# Patient Record
Sex: Male | Born: 1935 | Race: White | Hispanic: No | Marital: Married | Smoking: Former smoker
Health system: Southern US, Community
[De-identification: ages and names within clinical notes are randomized; demographics above are authoritative.]

## PROBLEM LIST (undated history)

## (undated) ENCOUNTER — Emergency Department (HOSPITAL_COMMUNITY): Admission: EM | Discharge status: Against Medical Advice | Payer: Medicare Other

## (undated) DIAGNOSIS — E7800 Pure hypercholesterolemia, unspecified: Secondary | ICD-10-CM

## (undated) DIAGNOSIS — J45909 Unspecified asthma, uncomplicated: Secondary | ICD-10-CM

## (undated) DIAGNOSIS — J189 Pneumonia, unspecified organism: Secondary | ICD-10-CM

## (undated) DIAGNOSIS — C61 Malignant neoplasm of prostate: Secondary | ICD-10-CM

## (undated) DIAGNOSIS — K219 Gastro-esophageal reflux disease without esophagitis: Secondary | ICD-10-CM

## (undated) DIAGNOSIS — G309 Alzheimer's disease, unspecified: Secondary | ICD-10-CM

## (undated) DIAGNOSIS — F329 Major depressive disorder, single episode, unspecified: Secondary | ICD-10-CM

## (undated) DIAGNOSIS — R0609 Other forms of dyspnea: Secondary | ICD-10-CM

## (undated) DIAGNOSIS — M1990 Unspecified osteoarthritis, unspecified site: Secondary | ICD-10-CM

## (undated) DIAGNOSIS — I10 Essential (primary) hypertension: Secondary | ICD-10-CM

## (undated) DIAGNOSIS — J449 Chronic obstructive pulmonary disease, unspecified: Secondary | ICD-10-CM

## (undated) DIAGNOSIS — J42 Unspecified chronic bronchitis: Secondary | ICD-10-CM

## (undated) DIAGNOSIS — F32A Depression, unspecified: Secondary | ICD-10-CM

## (undated) DIAGNOSIS — F0280 Dementia in other diseases classified elsewhere without behavioral disturbance: Secondary | ICD-10-CM

## (undated) DIAGNOSIS — R0600 Dyspnea, unspecified: Secondary | ICD-10-CM

## (undated) DIAGNOSIS — I82409 Acute embolism and thrombosis of unspecified deep veins of unspecified lower extremity: Secondary | ICD-10-CM

## (undated) DIAGNOSIS — Z9289 Personal history of other medical treatment: Secondary | ICD-10-CM

## (undated) HISTORY — PX: APPENDECTOMY: SHX54

## (undated) HISTORY — PX: TEAR DUCT PROBING: SHX793

## (undated) HISTORY — PX: CATARACT EXTRACTION W/ INTRAOCULAR LENS  IMPLANT, BILATERAL: SHX1307

## (undated) HISTORY — PX: EYE SURGERY: SHX253

## (undated) HISTORY — PX: PROSTATECTOMY: SHX69

## (undated) HISTORY — PX: TONSILLECTOMY: SUR1361

---

## 1997-09-30 ENCOUNTER — Inpatient Hospital Stay (HOSPITAL_COMMUNITY): Admission: EM | Admit: 1997-09-30 | Discharge: 1997-10-03 | Payer: Self-pay | Admitting: Emergency Medicine

## 1997-11-17 ENCOUNTER — Ambulatory Visit (HOSPITAL_COMMUNITY): Admission: RE | Admit: 1997-11-17 | Discharge: 1997-11-17 | Payer: Self-pay | Admitting: Ophthalmology

## 1998-02-09 ENCOUNTER — Ambulatory Visit (HOSPITAL_COMMUNITY): Admission: RE | Admit: 1998-02-09 | Discharge: 1998-02-10 | Payer: Self-pay | Admitting: Ophthalmology

## 2000-03-10 ENCOUNTER — Encounter: Payer: Self-pay | Admitting: Urology

## 2000-03-10 ENCOUNTER — Encounter: Admission: RE | Admit: 2000-03-10 | Discharge: 2000-03-10 | Payer: Self-pay | Admitting: Urology

## 2000-03-27 ENCOUNTER — Encounter: Admission: RE | Admit: 2000-03-27 | Discharge: 2000-06-25 | Payer: Self-pay | Admitting: Radiation Oncology

## 2000-06-26 ENCOUNTER — Encounter: Admission: RE | Admit: 2000-06-26 | Discharge: 2000-09-24 | Payer: Self-pay | Admitting: Radiation Oncology

## 2000-07-25 ENCOUNTER — Encounter: Payer: Self-pay | Admitting: Family Medicine

## 2000-07-25 ENCOUNTER — Encounter: Admission: RE | Admit: 2000-07-25 | Discharge: 2000-07-25 | Payer: Self-pay | Admitting: Family Medicine

## 2001-05-06 ENCOUNTER — Inpatient Hospital Stay (HOSPITAL_COMMUNITY): Admission: EM | Admit: 2001-05-06 | Discharge: 2001-05-09 | Payer: Self-pay | Admitting: Emergency Medicine

## 2001-05-09 ENCOUNTER — Encounter: Payer: Self-pay | Admitting: Internal Medicine

## 2001-08-20 ENCOUNTER — Ambulatory Visit (HOSPITAL_COMMUNITY): Admission: RE | Admit: 2001-08-20 | Discharge: 2001-08-20 | Payer: Self-pay | Admitting: Gastroenterology

## 2002-06-17 ENCOUNTER — Encounter: Payer: Self-pay | Admitting: Family Medicine

## 2002-06-17 ENCOUNTER — Encounter: Admission: RE | Admit: 2002-06-17 | Discharge: 2002-06-17 | Payer: Self-pay | Admitting: Family Medicine

## 2004-04-28 ENCOUNTER — Inpatient Hospital Stay (HOSPITAL_COMMUNITY): Admission: EM | Admit: 2004-04-28 | Discharge: 2004-05-01 | Payer: Self-pay | Admitting: Emergency Medicine

## 2004-07-18 ENCOUNTER — Inpatient Hospital Stay (HOSPITAL_COMMUNITY): Admission: EM | Admit: 2004-07-18 | Discharge: 2004-07-27 | Payer: Self-pay | Admitting: Emergency Medicine

## 2005-11-18 ENCOUNTER — Inpatient Hospital Stay (HOSPITAL_COMMUNITY): Admission: EM | Admit: 2005-11-18 | Discharge: 2005-11-25 | Payer: Self-pay | Admitting: Emergency Medicine

## 2008-05-13 ENCOUNTER — Encounter: Admission: RE | Admit: 2008-05-13 | Discharge: 2008-05-13 | Payer: Self-pay | Admitting: Family Medicine

## 2008-05-25 ENCOUNTER — Ambulatory Visit: Payer: Self-pay | Admitting: *Deleted

## 2008-05-25 ENCOUNTER — Ambulatory Visit: Payer: Self-pay | Admitting: Internal Medicine

## 2008-05-25 ENCOUNTER — Inpatient Hospital Stay (HOSPITAL_COMMUNITY): Admission: EM | Admit: 2008-05-25 | Discharge: 2008-05-27 | Payer: Self-pay | Admitting: Emergency Medicine

## 2008-06-19 ENCOUNTER — Encounter: Admission: RE | Admit: 2008-06-19 | Discharge: 2008-06-19 | Payer: Self-pay | Admitting: Obstetrics and Gynecology

## 2008-12-08 ENCOUNTER — Encounter: Admission: RE | Admit: 2008-12-08 | Discharge: 2008-12-08 | Payer: Self-pay | Admitting: Family Medicine

## 2010-01-17 ENCOUNTER — Emergency Department (HOSPITAL_COMMUNITY): Admission: EM | Admit: 2010-01-17 | Discharge: 2010-01-17 | Payer: Self-pay | Admitting: Emergency Medicine

## 2010-05-25 ENCOUNTER — Ambulatory Visit: Payer: Self-pay | Admitting: Ophthalmology

## 2010-06-23 ENCOUNTER — Ambulatory Visit: Payer: Self-pay | Admitting: Ophthalmology

## 2010-07-22 LAB — CBC
HCT: 41.1 % (ref 39.0–52.0)
Hemoglobin: 14.2 g/dL (ref 13.0–17.0)
MCH: 29.3 pg (ref 26.0–34.0)
MCHC: 34.5 g/dL (ref 30.0–36.0)
MCV: 84.8 fL (ref 78.0–100.0)
Platelets: 113 10*3/uL — ABNORMAL LOW (ref 150–400)
RBC: 4.85 MIL/uL (ref 4.22–5.81)
RDW: 14 % (ref 11.5–15.5)
WBC: 6.5 10*3/uL (ref 4.0–10.5)

## 2010-07-22 LAB — POCT CARDIAC MARKERS
CKMB, poc: <1 ng/mL — ABNORMAL LOW (ref 1.0–8.0)
CKMB, poc: <1 ng/mL — ABNORMAL LOW (ref 1.0–8.0)
Myoglobin, poc: 70.3 ng/mL (ref 12–200)
Myoglobin, poc: 82.7 ng/mL (ref 12–200)
Troponin i, poc: <0.05 ng/mL (ref 0.00–0.09)
Troponin i, poc: <0.05 ng/mL (ref 0.00–0.09)

## 2010-07-22 LAB — COMPREHENSIVE METABOLIC PANEL
ALT: 25 U/L (ref 0–53)
AST: 33 U/L (ref 0–37)
Albumin: 4.3 g/dL (ref 3.5–5.2)
Alkaline Phosphatase: 57 U/L (ref 39–117)
BUN: 21 mg/dL (ref 6–23)
CO2: 26 mEq/L (ref 19–32)
Calcium: 9.3 mg/dL (ref 8.4–10.5)
Chloride: 107 mEq/L (ref 96–112)
Creatinine, Ser: 1.5 mg/dL (ref 0.4–1.5)
GFR calc Af Amer: 54 mL/min — ABNORMAL LOW (ref >60)
GFR calc non Af Amer: 44 mL/min — ABNORMAL LOW (ref >60)
Glucose, Bld: 118 mg/dL — ABNORMAL HIGH (ref 70–99)
Potassium: 5.2 mEq/L — ABNORMAL HIGH (ref 3.5–5.1)
Sodium: 143 mEq/L (ref 135–145)
Total Bilirubin: 0.9 mg/dL (ref 0.3–1.2)
Total Protein: 7.3 g/dL (ref 6.0–8.3)

## 2010-07-22 LAB — PROTIME-INR
INR: 1.07 (ref 0.00–1.49)
Prothrombin Time: 14.1 seconds (ref 11.6–15.2)

## 2010-07-22 LAB — DIFFERENTIAL
Basophils Absolute: 0 10*3/uL (ref 0.0–0.1)
Basophils Relative: 1 % (ref 0–1)
Eosinophils Absolute: 0 10*3/uL (ref 0.0–0.7)
Eosinophils Relative: 1 % (ref 0–5)
Lymphocytes Relative: 18 % (ref 12–46)
Lymphs Abs: 1.2 10*3/uL (ref 0.7–4.0)
Monocytes Absolute: 0.7 10*3/uL (ref 0.1–1.0)
Monocytes Relative: 10 % (ref 3–12)
Neutro Abs: 4.6 10*3/uL (ref 1.7–7.7)
Neutrophils Relative %: 71 % (ref 43–77)

## 2010-08-23 LAB — CBC
HCT: 39.9 % (ref 39.0–52.0)
HCT: 43.8 % (ref 39.0–52.0)
Hemoglobin: 13.3 g/dL (ref 13.0–17.0)
Hemoglobin: 13.6 g/dL (ref 13.0–17.0)
Hemoglobin: 14.8 g/dL (ref 13.0–17.0)
MCHC: 33.3 g/dL (ref 30.0–36.0)
MCHC: 33.5 g/dL (ref 30.0–36.0)
MCHC: 33.7 g/dL (ref 30.0–36.0)
MCV: 87.4 fL (ref 78.0–100.0)
Platelets: 157 10*3/uL (ref 150–400)
Platelets: 183 10*3/uL (ref 150–400)
RBC: 4.67 MIL/uL (ref 4.22–5.81)
RBC: 5.02 MIL/uL (ref 4.22–5.81)
RDW: 13.1 % (ref 11.5–15.5)
RDW: 13.2 % (ref 11.5–15.5)
WBC: 15.2 10*3/uL — ABNORMAL HIGH (ref 4.0–10.5)
WBC: 16.7 10*3/uL — ABNORMAL HIGH (ref 4.0–10.5)

## 2010-08-23 LAB — DIFFERENTIAL
Basophils Absolute: 0.1 10*3/uL (ref 0.0–0.1)
Lymphocytes Relative: 5 % — ABNORMAL LOW (ref 12–46)
Lymphs Abs: 0.8 10*3/uL (ref 0.7–4.0)
Neutro Abs: 14.2 10*3/uL — ABNORMAL HIGH (ref 1.7–7.7)
Neutrophils Relative %: 85 % — ABNORMAL HIGH (ref 43–77)

## 2010-08-23 LAB — POCT I-STAT, CHEM 8
Chloride: 105 mEq/L (ref 96–112)
HCT: 47 % (ref 39.0–52.0)
Hemoglobin: 16 g/dL (ref 13.0–17.0)
Potassium: 4.3 mEq/L (ref 3.5–5.1)

## 2010-08-23 LAB — BASIC METABOLIC PANEL
BUN: 28 mg/dL — ABNORMAL HIGH (ref 6–23)
CO2: 25 mEq/L (ref 19–32)
Calcium: 9 mg/dL (ref 8.4–10.5)
Glucose, Bld: 187 mg/dL — ABNORMAL HIGH (ref 70–99)
Sodium: 141 mEq/L (ref 135–145)

## 2010-08-23 LAB — CULTURE, BLOOD (ROUTINE X 2): Culture: NO GROWTH

## 2010-08-23 LAB — CARDIAC PANEL(CRET KIN+CKTOT+MB+TROPI)
CK, MB: 1 ng/mL (ref 0.3–4.0)
Relative Index: INVALID (ref 0.0–2.5)
Relative Index: INVALID (ref 0.0–2.5)
Relative Index: INVALID (ref 0.0–2.5)
Total CK: 21 U/L (ref 7–232)
Total CK: 23 U/L (ref 7–232)

## 2010-08-23 LAB — COMPREHENSIVE METABOLIC PANEL
ALT: 13 U/L (ref 0–53)
AST: 26 U/L (ref 0–37)
Alkaline Phosphatase: 79 U/L (ref 39–117)
CO2: 24 mEq/L (ref 19–32)
Chloride: 103 mEq/L (ref 96–112)
GFR calc Af Amer: 46 mL/min — ABNORMAL LOW (ref >60)
GFR calc non Af Amer: 38 mL/min — ABNORMAL LOW (ref >60)
Glucose, Bld: 157 mg/dL — ABNORMAL HIGH (ref 70–99)
Potassium: 4.3 mEq/L (ref 3.5–5.1)
Sodium: 138 mEq/L (ref 135–145)
Total Bilirubin: 1.5 mg/dL — ABNORMAL HIGH (ref 0.3–1.2)

## 2010-08-23 LAB — BLOOD GAS, ARTERIAL
Acid-base deficit: 1.5 mmol/L (ref 0.0–2.0)
Bicarbonate: 18.1 mEq/L — ABNORMAL LOW (ref 20.0–24.0)
Bicarbonate: 23.5 mEq/L (ref 20.0–24.0)
FIO2: 1 %
O2 Saturation: 96.8 %
O2 Saturation: 99.4 %
Patient temperature: 97.4
Patient temperature: 98.6
TCO2: 15.4 mmol/L (ref 0–100)
TCO2: 20.4 mmol/L (ref 0–100)
pH, Arterial: 7.582 — ABNORMAL HIGH (ref 7.350–7.450)
pO2, Arterial: 85.2 mmHg (ref 80.0–100.0)

## 2010-08-23 LAB — PROTIME-INR
INR: 1.3 (ref 0.00–1.49)
Prothrombin Time: 16.2 seconds — ABNORMAL HIGH (ref 11.6–15.2)
Prothrombin Time: 17 seconds — ABNORMAL HIGH (ref 11.6–15.2)

## 2010-09-15 IMAGING — US US ABDOMEN COMPLETE
1 series · 13 of 25 positions shown · non-contrast
Comparison: None

CLINICAL DATA: Abdominal pain.  Right upper quadrant pain radiating
through to back.

ABDOMEN ULTRASOUND
TECHNIQUE: Complete abdominal ultrasound examination was performed
including evaluation of the liver, gallbladder, bile ducts,
pancreas, kidneys, spleen, IVC, and abdominal aorta.

[Series 1: us abdomen complete · 0.33mm/px · 13 of 100 slices shown]
[im 1/100]
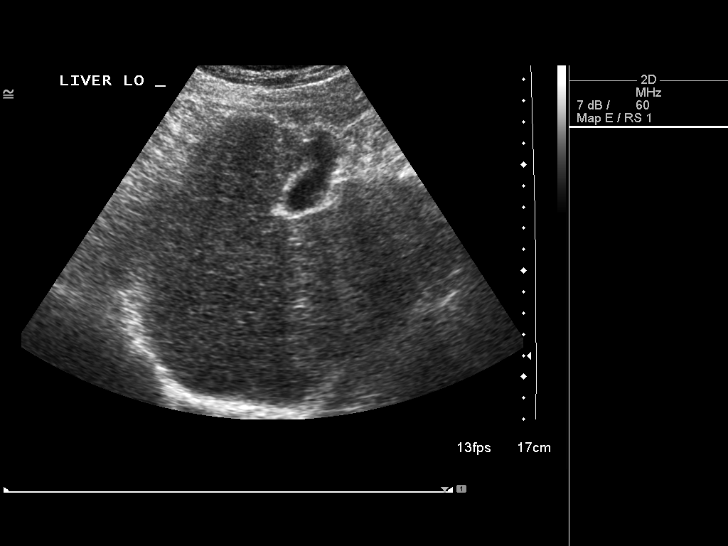
[im 9/100]
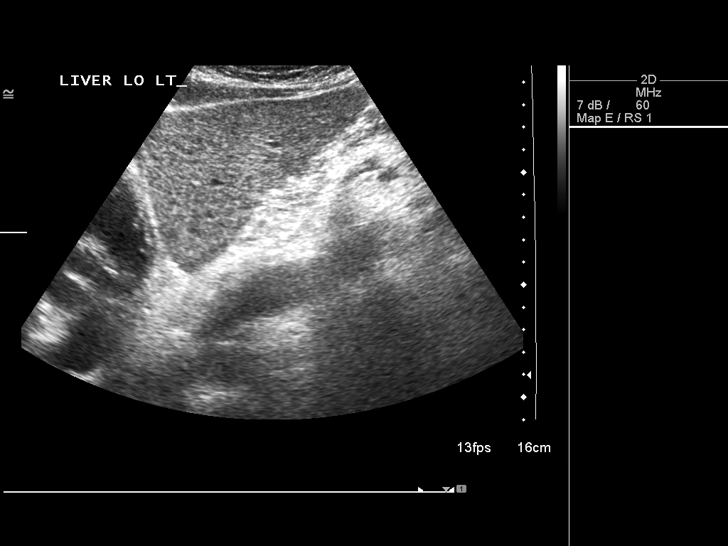
[im 17/100]
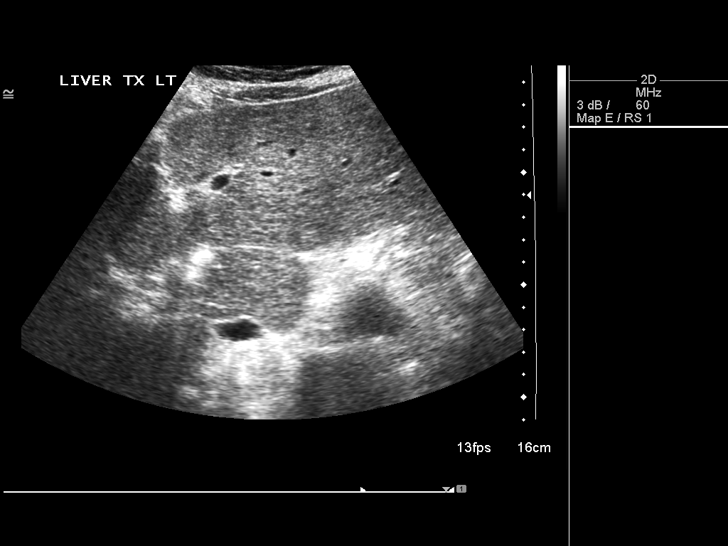
[im 25/100]
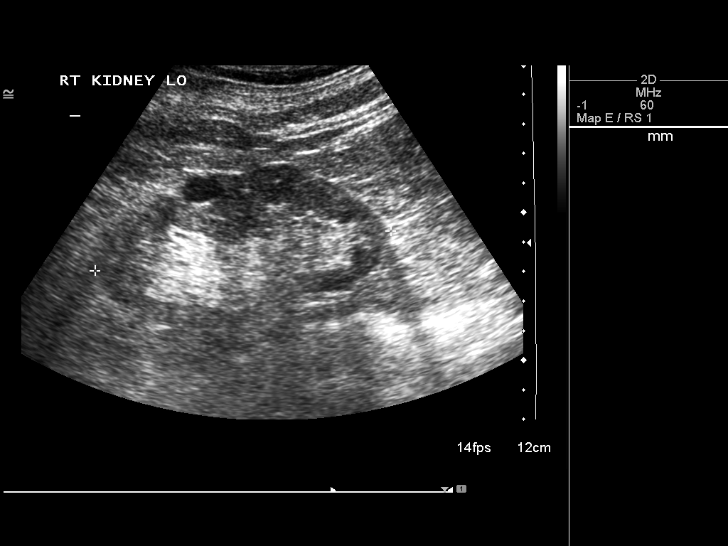
[im 34/100]
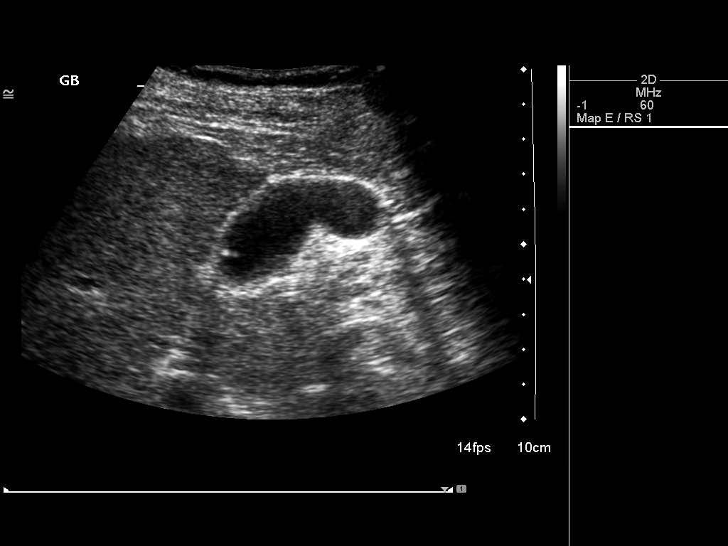
[im 42/100]
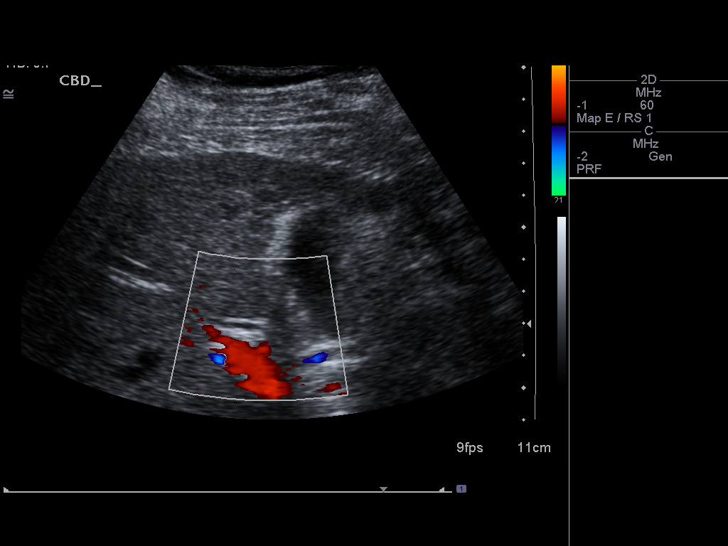
[im 50/100]
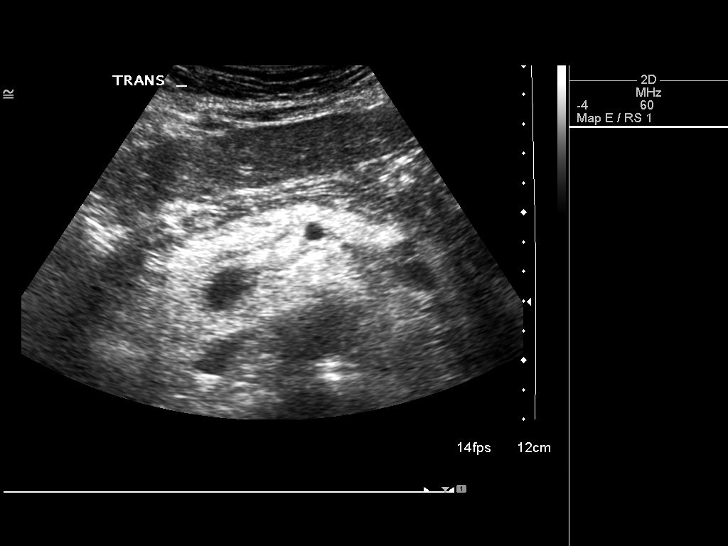
[im 58/100]
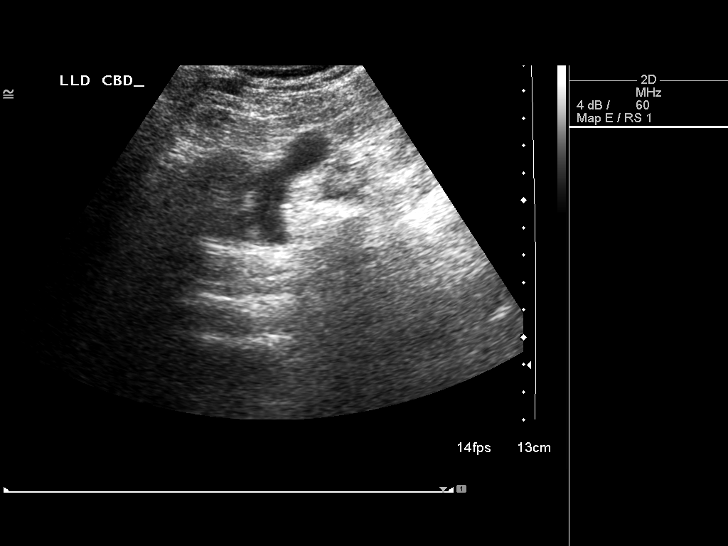
[im 67/100]
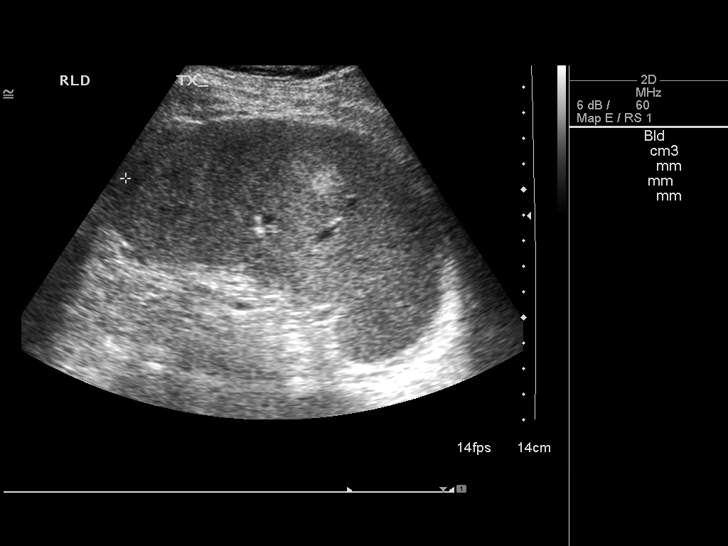
[im 75/100]
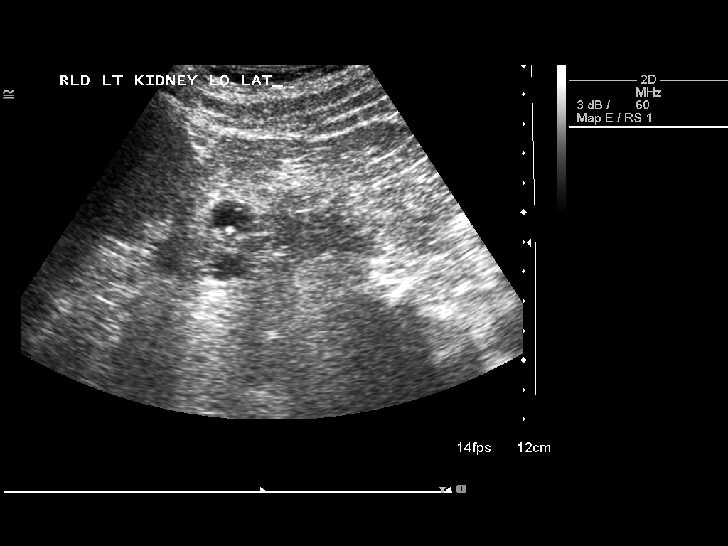
[im 83/100]
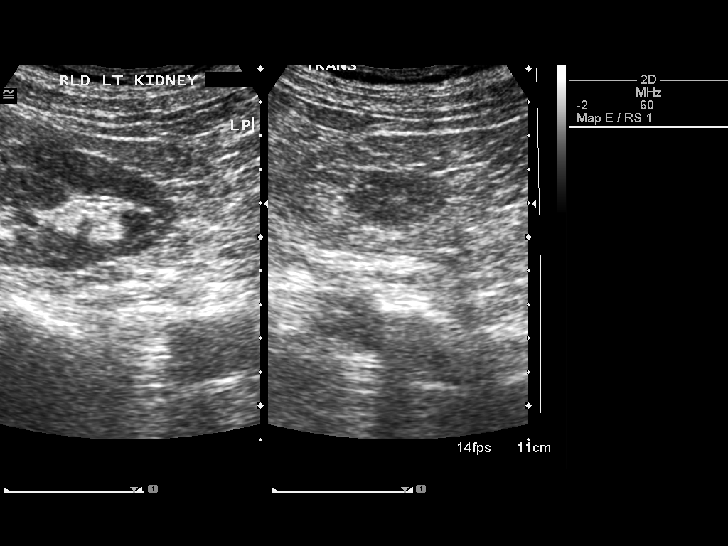
[im 91/100]
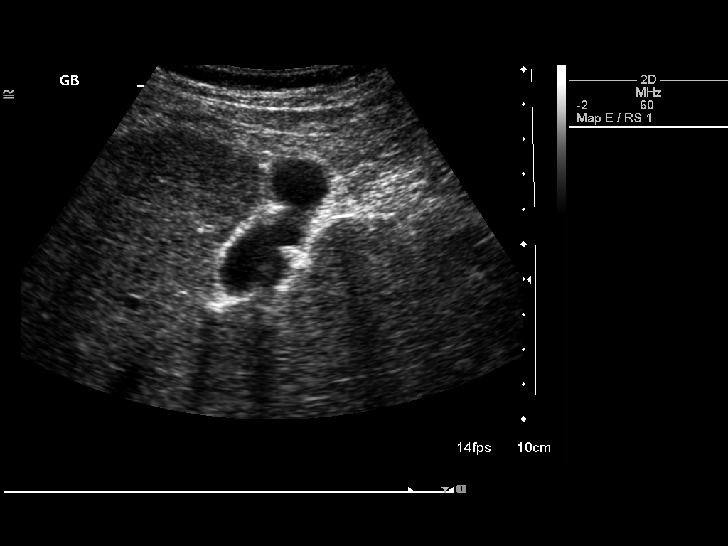
[im 100/100]
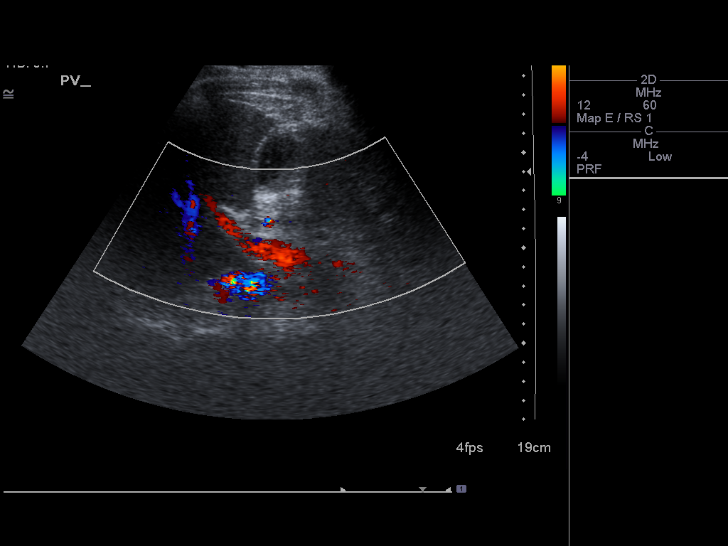

[13 of 25 positions shown; findings below may reference images not displayed]

FINDINGS: Two small gallstones.  Small gallbladder polyps.
Gallbladder wall thickness is 2.9 mm.  No biliary ductal
dilatation.  Mild diffuse coarsening of hepatic echotexture with
slight lobulation of contour raising the question of cirrhosis.
Patent IVC.  Echogenic pancreas compatible with fatty infiltration.
Pancreatic duct measures 3-4 mm in diameter.  Prominent spleen.
Spleen measures 15.3 x 5.5 x 13.5 cm.  Splenic volume is 593.4 cc.
Multiple echogenic foci in the spleen compatible with cavernous
hemangiomas, the largest measuring 1.5 x 1.4 x 1.6 cm.  The right
and left kidneys measure 10.1 and 11.2 cm in length, respectively.
1.3 x 1.0 x 1.2 slightly complex cyst anterolateral aspect of mid
left kidney.  Multiple slightly complex left renal cysts, the
largest measuring 1.6 x 1.2 x 1.2 cm with septation.  Abdominal
aortic maximum diameter is 2.0 cm.
IMPRESSION: Small gallstones and polyps.  No biliary ductal dilatation.
Diffuse hepatocellular disease.  Query cirrhosis. Splenomegaly.
Cavernous hemangiomas of the spleen.  Complex renal cysts--
consider follow-up renal ultrasound, for example in 9-12 months.

## 2010-09-21 NOTE — Discharge Summary (Signed)
[name], AMORY NO.:  [id number]   MEDICAL RECORD NO.:  [id number]          PATIENT TYPE:  INP   LOCATION:  1428                         FACILITY:  [facility]   PHYSICIAN:  Hollice Espy, M.D.DATE OF BIRTH:  [birth year]   DATE OF ADMISSION:  05/25/2008  DATE OF DISCHARGE:  05/27/2008                               DISCHARGE SUMMARY   PRIMARY CARE PHYSICIAN:  Ranae Plumber, M.D.   OPHTHALMOLOGIST:  An ophthalmologist at the [facility].   DISCHARGE DIAGNOSES:  1. Pneumonia.  2. Asthma exacerbation.  3. Hypertension.  4. History of Alzheimer's dementia.  5. Recent eyelid surgery.   DISCHARGE MEDICATIONS:  1. Zoloft 100 mg p.o. daily.  2. Vesicare 10 p.o. daily.  3. Galantamine 24 mg p.o. daily.  4. Ocuvite four times a day.  5. Vitamin D 1000 units b.i.d.  6. Nifedipine 30 p.o. daily.  7. Protonix 40.  8. Multivitamin daily.  9. Remeron 30.  10.Hydroxyzine 25.  11.Aspirin 81.  12.Advair Diskus 250/50 one puff four times a day.   New medicines:  1. Albuterol MDI 2 puffs four times a day x1 week then p.r.n.  2. Avelox 400 mg p.o. daily x5 more days.   HOSPITAL COURSE:  The patient is an 77 year old white male with past  medical history of asthma, depression, recent eyelid surgery who  presented with 2 days progressive dyspnea and shortness of breath.  Initially on admission he looked to be markedly dyspneic.  He is on 100%  oxygen mask and his pO2 was only 133, pCO2 of 19.2, pH 7.58.  The  patient was started on broad-spectrum IV antibiotics and by hospital day  2, his breathing was markedly improved.  His blood gases were in normal  range.  White count had completely normalized.  He has been comfortably  on 3 liters of oxygen.  This was able to be weaned down greatly by  hospital day 3.  The patient was breathing comfortably on room air.  He  was ambulated, saturations 91%.  His white blood cell count was normal.  His vital signs were stable.  He was  felt to be medically stable for  discharge.  He had previously some acute renal failure with chronic  renal insufficiency.  Following IV fluids, his renal function had  normalized.  Prior to discharge, his wife had spoken to his eye surgeon  who felt that it was okay for the patient's upper eyelid sutures to be  removed, leaving the transplant sutures intact.  We will attempt to  remove sutures prior to discharge.   The patient's overall disposition improved.   ACTIVITY:  Slow to increase.   DISCHARGE DIET:  Low-sodium diet.   He is being discharged to home.  He will follow up with PCP, Dr. Ranae Plumber in 7 days and his ophthalmologist in the next 2 weeks.      Hollice Espy, M.D.  Electronically Signed     SKK/MEDQ  D:  05/27/2008  T:  05/27/2008  Job:  [id number]   cc:   Dr. Ranae Plumber

## 2010-09-21 NOTE — H&P (Signed)
NAME:  [name], [name] NO.:  [id number]   MEDICAL RECORD NO.:  [id number]          PATIENT TYPE:  INP   LOCATION:  0105                         [facility]ACILITY:  [facility]   PHYSICIAN:  Lucita Ferrara, MD         DATE OF BIRTH:  [birth year]   DATE OF ADMISSION:  05/25/2008  DATE OF DISCHARGE:                              HISTORY & PHYSICAL   PRIMARY CARE PHYSICIAN:  Dr. Azucena Kuba.   CHIEF COMPLAINT:  Shortness of breath.   Patient is an 77 year old who presents with worsening dyspnea, shortness  of breath, wheezing, for approximately 24 to 48 hours.  Symptoms are  getting progressively worse.  Patient supposedly just had eyelid surgery  and has postoperative changes that are obvious upon observation.  Patient denies any fevers or chills.  He had an ED workup and he failed  emergency room nebulizer treatments and there was concern to admit the  patient given his progressively worsening shortness of breath.  He  denies any chest pain.  He denies any orthopnea or lower-extremity  edema.   PAST MEDICAL HISTORY:  1. Hypertension.  2. Asthma/questionable chronic obstructive pulmonary disease.  Patient      is not on home oxygen.  3. History of pneumonia, thought to be secondary to aspiration.  4. Chronic fatigue.  5. Alzheimer's dementia.  6. History of prostate cancer.  7. Psoriasis.   PAST SURGICAL HISTORY:  None.   SOCIAL HISTORY:  Patient denies drugs, alcohol or tobacco.  Lives at  home with his wife.  No recent travel and no recent prolonged road  trips.   ALLERGIES:  Allergic to SULFA, PENICILLIN, CODEINE, KENALOG.   MEDICATIONS AT HOME:  1. Aspirin 81 mg p.o. daily.  2. Folic acid 1 mg p.o. daily.  3. Methotrexate 2.5 mg p.o. daily.  4. Protonix 40 mg p.o. daily.  5. Singulair 10 mg p.o. daily.  6. Mirtazapine 30 mg at bedtime.  7. Nifedipine 30 mg p.o. daily.  8. Oxybutynin 10 mg p.o. daily.  9. Razadyne ER 24 mg p.o. daily.  10.Sertraline 100 mg p.o. daily.   REVIEW OF SYSTEMS:  As per HPI.  Otherwise, negative.   PHYSICAL EXAMINATION:  GENERAL:  The patient is in no acute distress.  Blood pressure is 124/66, pulse is 97, respirations 18, pulse oximetry  94% on 3 L nasal cannula.  HEENT:  Normocephalic, atraumatic.  Sclerae is nonanicteric.  There is  evidence of recent surgery noted.  No obvious drainage or erythema  noted.  NECK:  Supple.  No JVD.  No carotid bruits.  PERLA.  Extraocular muscles  are intact.  CARDIOVASCULAR:  S1, S2.  Regular rate and rhythm.  No murmurs, rubs or  clicks.  ABDOMEN:  Soft, nontender, nondistended.  Positive bowel sounds.  EXTREMITIES:  No clubbing, cyanosis or edema.  NEUROLOGIC:  Patient is alert, oriented x3.  Cranial nerves II through  XII grossly intact.   EMERGENCY ROOM COURSE:  Patient received albuterol/Atrovent/Solu-Medrol  and did not respond to treatment.  EKG shows a left axils deviation and  a left bundle  branch block, inverted T-waves and no changes since July 18, 2004.  Chest x-ray shows a reticular pattern at the right lung base.  This either represents recurrent pneumonia versus chronic airspace  disease or bronchiolitis.  Atelectasis on the left lung base.  ABG  7.5824/19.2, pO2 of 133, bicarbonate 18.1.  CBC:  White count 16.7,  hemoglobin 14.8, hematocrit 43.8, platelets 183.  PT/INR normal.  BUN  30, creatinine 1.8.   ASSESSMENT:  Patient is an 77 year old with:   1. Dyspnea likely from asthma/chronic obstructive pulmonary disease      exacerbation plus questionable pneumonia, likely aspiration type.  2. Leukocytosis, likely reactive or secondary to infectious etiology      and pneumonia.  3. Dehydration.  No azotemia.  No worsening renal functions.  4. Hypertension.  5. Alzheimer's dementia.   DISCUSSION AND PLAN:  Patient has a mixed respiratory alkalosis, likely  from hyperventilation and respiratory distress, and has an underlying  metabolic acidosis, likely from gap  metabolic acidosis and rule out  lactic acidosis and sepsis.  Patient will be admitted to the step-down  unit.  Oxygen nasal cannula with continuous pulse oximetry.  We will  start broad spectrum antibiotics and sepsis protocol with blood cultures  x2, urine culture.  We will start vancomycin, Zosyn and  fluoroquinolones.  Nebulizer treatments.  We will get critical care  medicine involved, as needed.  __________.  Patient's code status is  FULL CODE.  The rest of the plan is dependent on his progress.      Lucita Ferrara, MD  Electronically Signed     RR/MEDQ  D:  05/25/2008  T:  05/26/2008  Job:  [id number]

## 2010-09-24 NOTE — Procedures (Signed)
[facility]  Patient:    [name], ARO Visit Number: [id number] MRN: [id number]          Service Type: END Location: ENDO Attending Physician:  Orland Mustard Dictated by:   Llana Aliment. Randa Evens, M.D. Proc. Date: 08/20/01 Admit Date:  08/20/2001   CC:         Marinda Elk, M.D.   Procedure Report  PROCEDURE:  Colonoscopy.  MEDICATIONS:  Fentanyl 100 mcg, Versed 10 mg IV.  INDICATIONS FOR PROCEDURE:  A nice gentleman whose had rectal bleeding for approximately two months. He has had anal fissures. He has also had radiation therapy to his prostate. This procedure is done to determine the cause of the rectal bleeding.  DESCRIPTION OF PROCEDURE:  The procedure had been explained to the patient and consent obtained. With the patient in the left lateral decubitus position, the Olympus pediatric adjustable scope was inserted and advanced under direct visualization. Immediately upon entering the rectum, the patient was seen to have marked erythema of the anterior rectal wall over the prostate consistent with radiation. We went ahead and advanced to the cecum using abdominal pressure and position changes. We were finally able to reach the cecum in the right lateral decubitus position. The appendiceal orifice was seen and the right lower quadrant was transilluminated. The scope withdrawn and the cecum, ascending colon, hepatic flexure, transverse colon, splenic flexure, descending and sigmoid colon were seen well upon removal. No polyps or other lesions were seen. The scope was withdrawn back in the rectum and the rectum was carefully examined. 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 He tolerated the procedure well and was maintained on low flow oxygen and pulse oximeter throughout the procedure.  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  PLAN:  Will start him on Canasa suppositories and Miralax to keep  his stools soft and see back in the office in 6-8 weeks. Dictated by:   Llana Aliment. Randa Evens, M.D. Attending Physician:  Orland Mustard DD:  08/20/01 TD:  08/20/01 Job: [id number]

## 2010-09-24 NOTE — Procedures (Signed)
REQUESTING PHYSICIAN:  Kela Millin, M.D.   This is a portable EEG done without photic stimulation or hyperventilation.  The patient is described as awake and drowsy.   CLINICAL HISTORY:  A 77 year old man with episodic shaking of the lower  extremities and confusion. EEG is performed for evaluation of possible  seizure.   DESCRIPTION:  The dominant rhythm of this tracing has a moderate amplitude  alpha rhythm of 9 to 10 hertz which predominates posteriorly and appears  with abnormal asymmetry and attenuates with eye opening and closing. Low  amplitude fast activity is seen frontally and centrally and appears without  abnormal asymmetry. No focal slowing is noted. No epileptiform discharges  are seen. Drowsiness occurs naturally as evidenced by fragmentation of the  background and generalized attenuation of rhythms. No abnormalities occurred  in the drowsy state. Stage 2 sleep is not seen. Photic stimulation and  hyperventilation are not performed. Single channel devoted to EKG reveals  sinus rhythm throughout with rate of approximately 72 beats per minute.   CONCLUSION:  Normal study in the awake and lightly drowsy state.      FAO:ZHYQ  D:  07/23/2004 21:02:29  T:  07/24/2004 08:24:38  Job #:  [id number]

## 2010-09-24 NOTE — H&P (Signed)
NAMEKELAN, PRITT NO.:  [id number]   MEDICAL RECORD NO.:  [id number]          PATIENT TYPE:  INP   LOCATION:  1826                         FACILITY:  [facility]   PHYSICIAN:  Deirdre Peer. Polite, M.D. DATE OF BIRTH:  December 26, 1922   DATE OF ADMISSION:  04/28/2004  DATE OF DISCHARGE:                                HISTORY & PHYSICAL   CHIEF COMPLAINT:  Shortness of breath and confusion for two days.   HISTORY OF PRESENT ILLNESS:  The patient is a 77 year old gentleman with a  history of COPD who was brought in by his wife because of a two day history  of progressively worsening confusion and complaints of shortness of breath.  According to the wife, the patient has been coughing, as well, with sputum  production which is described as yellowish in color which is different from  his usual whitish color.  He has been having decreased appetite with  decreased p.o. intake.  He has been feeling weak and unable to do his  usual  activities of daily living.  The patient normally walks without assistance,  but it has been very difficult for him to walk based on generalized  weakness.  HE has had some occasional loose stools which were not bloody,  not mucoid.  He has a history of fever without any chills.  No history of  headache or light headedness, trouble swallowing, sore throat, skin rash,  joint pain.   PAST MEDICAL HISTORY:  Positive for history of hypertension, dementia,  anemia, prostate cancer status post radical prostatectomy/radiation therapy  three years ago, history of COPD.   CURRENT MEDICATIONS:  Aspirin 81 mg daily, Zoloft 100 mg daily, Methotrexate  2.5 mg every week, Protonix 40 mg daily, iron 65 mg daily, Nifedical XL 30  mg daily, Razedyne 24 mg daily, Methylin 10 mg b.i.d.   ALLERGIES:  He is allergic to Penicillin, sulfa medications, Zithromax.   FAMILY HISTORY:  Negative for heart disease.   SOCIAL HISTORY:  The patient lives with his wife.  They have  eight children  who live away from home.  He does not smoke cigarettes nor drink alcohol  except for occasionally.   REVIEW OF SYMPTOMS:  Negative other than as stated in HPI.   PHYSICAL EXAMINATION:  VITAL SIGNS:  Temperature 100.6 on presentation to the ED, however,  temperature 99.9 at the time of my evaluation, blood pressure 112/54, pulse  97, respirations 22, O2 saturation 95% on 2 liters oxygen by nasal cannula.  GENERAL:  He was not in acute cardiopulmonary distress.  He was confused,  however.  HEENT:  Normocephalic, atraumatic, pupils equal, round, reactive to light,  extraocular movements intact, he had mild conjunctival pallor without  icterus, oropharynx was moist without exudate or erythema.  NECK:  Supple, no JVD.  RESPIRATORY:  He had decreased breath sounds in the right lower half of the  right lung fields with rales and wheezes which were expiratory.  CARDIAC:  Regular rate and rhythm, no gallops, no murmurs.  ABDOMEN:  Soft, nontender.  SKIN:  Warm and dry.  No  rash.  EXTREMITIES:  No edema, no cyanosis.  CNS:  The patient was alert and oriented x 2, he was pleasantly confused,  there was no acute focal deficit.   LABORATORY DATA:  X-ray showed right lower lobe infiltrate, left upper lobe  nodular opacities.  12 lead EKG showed sinus rhythm at 88 beats per minute  with left axis deviation, no acute ST-T wave changes in contiguous leads.  UA color was amber, appearance clear, specific gravity 1.025, pH 5, glucose  negative, hemoglobin negative, ketones 4, protein 13,  urobiligen 2, nitrite  negative, leukocyte esterase negative.  WBC count 22.8, hemoglobin 15.3,  hematocrit 44.6, MCV 85.1, platelet count 236.  Sodium 133, potassium 4.1,  chloride 100, CO2 25, glucose 89, BUN 16, creatinine 1.4.  The rest of  comprehensive metabolic panel was within normal limits.   IMPRESSION:  1.  Community acquired pneumonia.  2.  Mild COPD exacerbation secondary to number  1.  3.  Altered mental status change secondary to numbers 1 and 2.  4.  Opacification on x-ray, follow up recommended.   PLAN:  The patient was admitted to telemetry bed, will obtain repeat of his  leukocyte count, will continue antibiotic coverage, continue oxygen, and  nebulization, further recommendations will be made.      Deirdre Peer. Polite, M.D.  Electronically Signed  Maryann Conners   GO/MEDQ  D:  04/28/2004  T:  04/28/2004  Job:  [id number]

## 2010-09-24 NOTE — Discharge Summary (Signed)
NAMEMarland Kitchen  [name], [name] NO.:  [id number]   MEDICAL RECORD NO.:  [id number]          PATIENT TYPE:  INP   LOCATION:  2005                         FACILITY:  [facility]   PHYSICIAN:  Deirdre Peer. Polite, M.D. DATE OF BIRTH:  05/29/1922   DATE OF ADMISSION:  04/28/2004  DATE OF DISCHARGE:  05/01/2004                                 DISCHARGE SUMMARY   ADDENDUM:  To dictation [id number].   Please carbon copy dictation [phone] to the patients primary MD, Dr. _____ of  [facility].      [name]   RDP/MEDQ  D:  05/01/2004  T:  05/02/2004  Job:  [id number]

## 2010-09-24 NOTE — H&P (Signed)
NAMEMarland [name]  CHRSTOPHER, MALENFANT NO.:  [id number]   MEDICAL RECORD NO.:  [id number]          PATIENT TYPE:  EMS   LOCATION:  ED                           FACILITY:  [facility]   PHYSICIAN:  Deirdre Peer. Polite, M.D. DATE OF BIRTH:  Jan 31, 1923   DATE OF ADMISSION:  11/18/2005  DATE OF DISCHARGE:                                HISTORY & PHYSICAL   CHIEF COMPLAINT:  Productive cough, malaise.   HISTORY OF PRESENT ILLNESS:  Mr. [name] is an 77 year old male with known  history of Alzheimer-type dementia, asthma, hypertension, history of  prostate CA status post prostatectomy, psoriasis who presents to the ED with  the above chief complaint of productive cough.  According to the wife, the  patient had not been doing well for about a week, essentially has not been  eating well for about a week.  Since that time has been having some  productive cough of yellow-green sputum, some chills, no significant fever.  The patient did see his primary MD about a week ago and had some blood  tests.  Per the wife the blood tests showed elevated white count.  Tuesday  of this week the patient had been started on an antibiotic (Avelox).  Despite the above treatment, the patient still essentially had failure to  thrive.  In the ED the patient is evaluated, he is febrile, has an x-ray  which shows probable acute pneumonia at the right base superimposed on  chronic interstitial disease.  No other laboratory studies are available at  this time.  [facility]ist has been called for further evaluation.  At  the time of my eval the patient is pleasant, alert, and oriented x3, no  respiratory distress and then his wife retells the story as stated above  therefore admission is deemed necessary for further evaluation and treatment  of bronchitis/asthma exacerbation for possible right lower lobe pneumonia.   PAST MEDICAL HISTORY:  As stated above significant for hypertension, history  of asthma, questionable  COPD, history of prostate CA diagnosed in 1990  status post prostatectomy, also status post XRT.  According to the wife, the  last PSA was zero approximately 6 months ago.  Alzheimer's.  Psoriasis.   MEDICATIONS:  1.  Albuterol b.i.d.  2.  Aspirin 81 mg daily.  3.  Folic acid 1 mg daily.  4.  Methotrexate 2.5 mg daily (according to the wife he refused to take it      over the last 2 weeks).  5.  Protonix 40 mg daily.  6.  Singulair 10 mg daily.  7.  Mirtazapine 30 mg at bedtime.  8.  Nifedipine 30 mg daily.  9.  Oxybutynin 10 mg daily.  10. Razadyne ER 24 mg daily.  11. Sertraline 100 mg daily.   SOCIAL HISTORY:  Negative for tobacco, alcohol, or drugs.   PAST SURGICAL HISTORY:  Past surgical history significant for appendectomy  and prostatectomy.   ALLERGIES:  REPORTS ALLERGY TO CODEINE, PENICILLIN, AND SULFA.   FAMILY HISTORY:  Noncontributory.   REVIEW OF SYSTEMS:  As stated in HPI.   PHYSICAL  EXAMINATION:  GENERAL:  The patient is alert and oriented x3.  No  apparent distress.  VITALS:  Temperature 99.1, BP 136/73, pulse 111, respiratory rate 17,  saturations 92%.  HEENT:  Anicteric sclerae, very dry oral mucosa, no nodes, no JVD.  CHEST:  Bibasilar crackles.  CARDIOVASCULAR:  Regular.  No S3.  ABDOMEN:  Soft, nontender.  EXTREMITIES:  No clubbing, no cyanosis, no edema, 1 to 2+ pulse.  NEURO EXAM:  Grossly intact.   DATA:  As stated above.  Chest x-ray probable acute pneumonia at the right  base superimposed on chronic interstitial disease.  Other data is pending at  the time of this dictation.   ASSESSMENT:  1.  Bronchitis/asthma exacerbation rule out right lower lobe pneumonia.  2.  Rule out failure to thrive.  3.  Alzheimer's-type dementia.  4.  History of prostate cancer.  5.  Psoriasis.  6.  Hypertension.   PLAN:  Recommend patient be admitted to a medicine floor bed.  The patient  will be treated with IV fluids, IV antibiotics, O2 and nebs.  If the  patient  has significant wheezing steroids will be added.  We will try to obtain  sputum culture, UA, C&S, blood cultures if temperature greater than 38.5.  The patient will have a followup chest x-ray after IV fluids have been  given.  As the patient has been having very poor p.o. intake we will have  speech evaluation to rule out signs of aspiration.  We will make further  recommendations as deemed necessary.      Deirdre Peer. Polite, M.D.  Electronically Signed     RDP/MEDQ  D:  11/18/2005  T:  11/18/2005  Job:  [id number]

## 2010-09-24 NOTE — Discharge Summary (Signed)
NAMEMarland Kitchen  [name], [name] NO.:  [id number]   MEDICAL RECORD NO.:  [id number]          PATIENT TYPE:  INP   LOCATION:  2005                         FACILITY:  [facility]   PHYSICIAN:  Deirdre Peer. Polite, M.D. DATE OF BIRTH:  1922-07-01   DATE OF ADMISSION:  04/28/2004  DATE OF DISCHARGE:  05/01/2004                                 DISCHARGE SUMMARY   DISCHARGE DIAGNOSES:  1.  Right lower lobe pneumonia, improved with treatment.  2.  Chronic obstructive pulmonary disease.  3.  Alzheimer's disease.  4.  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  5.  Eczema.  The patient has been off of methotrexate for greater than two      weeks, secondary to the current illness.  6.  Hypertension.   DISCHARGE MEDICATIONS:  1.  Avelox 400 mg daily, a total of a 14-day-course.  2.  Albuterol MDI, two puffs q.i.d.  3.  The patient is asked to resume his home medicines including aspirin.  4.  Zoloft.  5.  Methotrexate.  6.  Protonix.  7.  Nifedical.  8.  ____________.  The patient has been off of this medicine x3 days, per      the wife, as she thinks this has been the cause of his decreased p.o.      intake.   CONDITION ON DISCHARGE/DISPOSITION:  The patient is being discharged to home  in stable condition, per the wife's request, to be discharged today before  Christmas.   FOLLOWUP:  1.  The patient is to be followed up by the primary M.D. in one week.  2.  The patient will require a home health PT.   STUDIES:  The patient had a chest x-ray which showed a right lower lobe  pneumonia, an asymmetric nodular opacity in the left upper lobe.  Recommended a follow-up chest x-ray after treating the pneumonia.  Also  showed emphysema and probable pulmonary fibrosis in the lower lobe.  The patient had a swallowing evaluation.  There were no signs of aspiration.  She had a followup chest x-ray which showed slight progression of bibasilar  infiltrate.  Also suggested a component  of underlying chronic lung changes,  ___________, cardiomegaly, and mild pulmonary vascular prominence.  Sputum culture with gram-positive cocci in pair, possibly consistent with  Streptococcus pneumonia.  A urine culture was negative.  Blood culture  negative.  A CBC on admission revealed a white count of 22.8, hemoglobin  15.3, neutrophil count of 93.  CBC at discharge with a white count of 11.3,  hemoglobin 12.1.  BMET at discharge within normal limits, with a mild  elevation in glucose.  Cardiac enzymes were ordered, which were negative x3.  A urinalysis was negative.  Hemoccult negative.  Blood cultures negative.   HISTORY OF PRESENT ILLNESS:  An elderly male with a known history of  hypertension, dementia, prostate carcinoma, status post radical  prostatectomy and COPD, who presented to the emergency department for the  progressive decline, although weak at home, characterized by shortness of  breath, a productive cough and a decreased appetite.  While at home, the  patient's wife had stopped his methotrexate, as she suspected an infectious  etiology, and also stopped his methylene, as she suspected that this was the  cause for his decreased p.o. intake.  In the emergency room the patient was  evaluated and found to have an abnormal chest x-ray, consistent with a right  lower lobe pneumonia and significant leukocytosis on a CBC.  Admission was  deemed necessary for further evaluation and treatment.  Please see the  dictated H&P for further details.   HOSPITAL COURSE:  As for the patient's right lower lobe pneumonia, the  patient was started on empiric antibiotics.  Because the patient had  pneumonia in the right lower lobe distribution, a speech evaluation was  obtained, to rule out aspiration.  There were no signs of aspiration by  speech therapy.  Therefore the patient was continued on antibiotics for a  community-acquired pneumonia.  The patient was given gentle IV fluids,   nebulizers and hydration.  The patient had a significant improvement in his  leukocytosis and remained afebrile throughout the duration of his  hospitalization.  The patient was somewhat unsteady with his gait and was seen by PT, and it  was recommended that he have home health PT.  The patient has improved  dramatically with the above treatments, and as it is the Christmas holiday,  the wife wishes for an early discharge.  At this time it is felt that the  patient is stable for discharge to home.  O2 saturations are acceptable.  He  has been afebrile.  Full p.o. intake has been tolerated.  Therefore the  patient will continue on a 14-day-course of antibiotics.  It is noted that  the patient was wheezing on admission, therefore the steroids were started.  The patient will have a rapid steroid taper at home.  The patient will  continue with nebulizer treatments and start valve use at home.  The patient will need to follow up with his primary M.D. in approximately  one week for further evaluation.  The patient has several other medical problems, which have been outlined.  He is asked to resume his home medications.  At this time the patient is  stable for discharge.      [name]   RDP/MEDQ  D:  05/01/2004  T:  05/03/2004  Job:  [id number]   cc:   Molly Maduro L. Foy Guadalajara, M.D.  [address] [place]  [place] [address]  Fax: [phone]

## 2010-09-24 NOTE — H&P (Signed)
NAMESHLOIMA, CLINCH NO.:  [id number]   MEDICAL RECORD NO.:  [id number]          PATIENT TYPE:  INP   LOCATION:  0444                         FACILITY:  [facility]   PHYSICIAN:  Theone Stanley, MD   DATE OF BIRTH:  02-Dec-1922   DATE OF ADMISSION:  07/18/2004  DATE OF DISCHARGE:                                HISTORY & PHYSICAL   CHIEF COMPLAINT:  Weakness.   HISTORY OF PRESENT ILLNESS:  [name] is a very pleasant 77 year old  gentleman with underlying dementia, question of chronic obstructive  pulmonary disease, positive for asthma, hypertension, eczema, presenting  with weakness.  According to the wife, the patient was doing fine until last  night where he was moaning most of the night.  He got up, he went to the  rest room.  When she went in, she found him on the floor.  He was able to  get up and walk to the dining room however, when he sat down, he fell  backwards and hit his head.  He has had some shortness of breath but  otherwise the patient has been doing well until recently.  In addition, he  has been confused today more than normal.  His baseline, is he is able to  converse.  He is able to feed himself.  He is able to walk around with a  walker and participate in his activities of daily living.   PAST MEDICAL HISTORY:  1.  Dementia.  2.  Asthma.  3.  Questionable chronic obstructive pulmonary disease.  4.  Hypertension.  5.  Eczema.  6.  Prostate cancer status post a radical prostatectomy and radiation      therapy three years ago.  7.  The patient recently had pneumonia in December.   MEDICATIONS:  1.  Singulair 10 mg one p.o. daily.  2.  Advair 250/500 inhalant one b.i.d.  3.  Zoloft 100 mg one p.o. daily.  4.  Protonix 40 mg one p.o. daily.  5.  Methotrexate twice a week.  6.  Albuterol MDI p.r.n.  7.  Nifedical XL 30 mg one p.o. daily.  8.  Aspirin 81 mg one p.o. daily.  9.  Razadyne ER 24 mg one p.o. daily.  10. Enablex 7.5 mg one  p.o. daily.   ALLERGIES:  Azithromycin:  He has a rash.  Codeine, Kenalog, latex,  penicillin:  The patient has problems with swelling of his oropharynx.  Sulfa:  He has hives.   FAMILY HISTORY:  Noncontributory.   SURGERY:  The patient had a prostatectomy, appendectomy.   SOCIAL HISTORY:  The patient lives at home with his wife.  He has eight  children.  He quit 30 years ago smoking.  He occasionally drinks alcohol.  No illicit drug use.   REVIEW OF SYSTEMS:  Positive fever and chills.  He has intermittent blood in  his stools however, this is not new.  He had some weight loss however, he  was placed on Megace and he had improvement in his appetite.   PHYSICAL EXAMINATION:  VITAL SIGNS:  Temperature of  101.4, blood pressure  148/91, pulse of 106, respiratory rate of 24, sating 96% on 2 liters.  HEENT:  Head is atraumatic and normocephalic.  Eyes:  3 mm, pupils equal and  reactive to light.  Extraocular movements intact.  Ears:  No discharge.  Throat clear.  No erythema, no exudate.  Mucosa moist.  NECK:  Supple.  No  lymphadenopathy.  HEART:  Regular rate and rhythm.  No murmurs, rubs or  gallops appreciated.  LUNGS:  Clear to auscultation bilaterally.  ABDOMEN:  Soft, nontender and nondistended.  EXTREMITIES:  No edema, cyanosis or  clubbing.  SKIN: The patient had dry skin in multiple areas of erythematous,  scaly areas but no infection.  NEUROLOGICAL:  The patient appeared to be  alert to person, nonfocal.   LABORATORIES:  White count of 16, hemoglobin of 15, hematocrit at 42,  platelets at 136,000.  Sodium 135, potassium of 4, chloride at 105, bicarb  of 24, glucose of 110, BUN at 20, creatinine at 1.3.  Total protein at 6.6,  albumin at 3.6.  Chest x-ray shows chronic interstitial lung disease, no  acute infiltrates, a left upper lobe nodule.  Recommend CT scan.   ASSESSMENT AND PLAN:  1.  Weakness, confusion, mild shortness of breath.  This could be a chronic       obstructive pulmonary disease exacerbation however, the patient has not      been officially diagnosed with chronic obstructive pulmonary disease.      There is an ill-defined nodule on the left upper lobe.  This could be      evidence of early pneumonia.  Will treat him as such.  Start him on      Avelox IV.  Continue with O2 nasal cannula p.r.n. and nebulizers p.r.n.  2.  Alzheimer's.  The patient is confused which is most likely secondary to      his underlying disease and the current situation.  Will watch this      closely.  3.  Hypertension.  Will continue on his medications.  4.  Prophylaxis.  We will start the patient on Lovenox and continue with a      proton pump inhibitor.      AEJ/MEDQ  D:  07/18/2004  T:  07/18/2004  Job:  [id number]

## 2010-09-24 NOTE — Discharge Summary (Signed)
NAME:  AMALIO, LOE NO.:  [id number]   MEDICAL RECORD NO.:  [id number]          PATIENT TYPE:  INP   LOCATION:  1322                         FACILITY:  [facility]   PHYSICIAN:  Sherin Quarry, MD      DATE OF BIRTH:  October 18, 1922   DATE OF ADMISSION:  11/18/2005  DATE OF DISCHARGE:  11/24/2005                                 DISCHARGE SUMMARY   [name] is an 77 year old man with a history of Alzheimer's dementia,  asthma, hypertension and prostate cancer, who initially presented to the  [facility] Emergency Room on 07/13 with complaints of productive cough and  malaise.  Despite outpatient antibiotic therapy, the patient continued to  experience persistent fatigue and malaise.  In the emergency room, a  portable chest x-ray was obtained, which showed evidence of right lower lobe  pneumonia, and it was decided that it would be prudent to admit the patient  to the hospital for further evaluation and treatment.   Physical examination at the time of admission, as described by Dr. Deirdre Peer.  Polite.  Temperature was 99.  HEENT exam was within normal limits.  The  chest revealed bibasilar crackles.  Cardiovascular exam showed normal S1 and  S2.  There was no S3.  The abdomen was benign.  Neurologic testing was  within normal limits.  Examination of extremities showed no evidence of  cyanosis or edema.   The white count was 18,400, hemoglobin was 13.9.  Sodium was 138, potassium  3.9, creatinine was 1.4, BUN was 16.  Urinalysis was unremarkable.  A urine  culture was obtained, which appeared to have been contaminated.  There was  no predominant organism which grew.  Blood cultures were negative.   On admission, Dr. Nehemiah Settle placed the patient on Avelox 400 mg IV daily.  The  patient was placed on Lovenox prophylaxis, as well as Protonix 40 mg daily  and folic acid.  He was maintained on Remeron and Reminyl 12 mg twice daily.  The patient's course was one of gradual  improvement.  He initially  experienced some nocturnal agitation, which gradually resolved.  On July 16,  he was seen by speech therapy and underwent a modified barium swallow, which  showed no evidence of aspiration, and a regular diet was recommended.  The  patient's wife felt that he would benefit from a stay in a skilled nursing  facility and, therefore, arrangements were made for admission to [facility].  Because of persistent coughing and chest congestion, I chose  to obtain a CT scan of the chest, which was done on July 18.  This showed no  evidence of malignancy and significant bronchiectasis and air space opacity  in the right lung, which looked like an aspiration pneumonia, even though,  as mentioned previously, the patient's speech study suggests that he has not  been aspirating.  In light of this finding, I thought it was prudent to add  clindamycin 600 mg twice daily to the Avelox, as this has been reported to  be efficacious in treating aspiration pneumonia.  At the  time of discharge,  discharge diagnoses were:   DISCHARGE DIAGNOSES:  1.  Right lower lobe pneumonia, possibly secondary to aspiration, with      evidence of underlying bronchiectasis.  2.  Chronic fatigue.  3.  Alzheimer's type dementia.  4.  Prostate cancer.  5.  Psoriasis.  6.  Hypertension.   DISCHARGE MEDICATIONS:  1.  Protonix 40 mg daily.  2.  Folic acid 1 mg daily.  3.  Aspirin 81 mg daily.  4.  Remeron 30 mg at bedtime daily.  5.  Razadyne 24 mg daily.  6.  Ventolin metered dose inhaler 2 puffs q.i.d.  7.  Megace 625 mg daily in formula suspension.  8.  Sliding scale insulin regimen.  The patient's CBGs are measured a.c. and      h.s.  For CBG of 150-250, 3 units; 250-350, 7 units; 350-450, 9 units of      NovoLog insulin.  9.  Singulair 10 mg daily.  10. Ditropan 10 mg daily.  11. Zoloft 100 mg daily.  12. Avelox 400 mg daily times 7 days.  13. Clindamycin 600 mg twice daily  times 7 days.  14. Ensure supplements.  15. Tylenol 325 mg 1-2 every 6 hours p.r.n. for pain.   CONDITION ON DISCHARGE:  Fair.           ______________________________  Sherin Quarry, MD     SY/MEDQ  D:  11/24/2005  T:  11/24/2005  Job:  [id number]   cc:   Molly Maduro L. Foy Guadalajara, M.D.  Fax: [phone]

## 2010-09-24 NOTE — Discharge Summary (Signed)
[place]  Patient:    [name], [name] Visit Number: [id number] MRN: [id number]          Service Type: MED Location: 3000 [id number] [id number] Attending Physician:  Rosanne Sack Dictated by:   Rosanne Sack, M.D. Admit Date:  05/06/2001 Discharge Date: 05/09/2001   CC:         Dr. ______________, [facility]  Claudette Laws, M.D., urologist  Deanna Artis. Lorenso Quarry, M.D., dermatologist   Discharge Summary  DATE OF BIRTH:  21-Jul-1922.  DISCHARGE DIAGNOSES:  1. Right middle lobe pneumonia with acute chronic obstructive pulmonary     disease exacerbation.  2. Dehydration, improved.  3. Mild hypokalemia, resolved.  4. Mild transient rectal bleeding secondary to radiation proctitis.  5. History of prostatic carcinoma.     a. Status post radical prostatectomy followed by radiation therapy about        eight months ago.     b. PSA being followed by Claudette Laws, M.D.  6. Hypertension.  7. Chronic lung disease consistent with mild pulmonary fibrosis by chest     x-ray.  8. Gastroesophageal reflux disease.  9. History of Guillain-Barre syndrome about four years ago. 10. History of skin cancer.     a. Status post excision of skin lesions in the forehead and left        nipple by Deanna Artis. Lorenso Quarry, M.D. 11. Severe eczema.     a. Methotrexate held during this hospitalization due to problem #1. 12. Mild normocytic anemia.     a. Hemoglobin 12.2, MCV 86. 13. Allergies to aspirin, erythromycin, sulfa drugs, codeine, latex,     eggs, fish, lima beans as well as penicillin.  DISCHARGE MEDICATIONS: 1. Prednisone taper, 30 mg p.o. q.d. x 2 days, then 20 mg p.o. q.d. x 2, then    10 mg p.o. q.d. x 2 days, then 10 p.o. q. every other day. 2. Tequin 400 mg p.o. q.d. x 9 days. 3. Advair 250/50 one puff b.i.d. 4. Atrovent MDI two puffs three times a day. 5. Prilosec 30 mg p.o. q.d. 6. Zyrtec 10 mg p.o. q.d. 7. Lotensin 10  mg p.o. q.d. 8. Methotrexate is being held until further notice.  DISCHARGE FOLLOW-UP AND DISPOSITION:  Mr. Torpey will be followed by Dr. ____________ in [facility] within five to seven days.  He is to repeat another chest x-ray within two weeks or so to confirm the resolution of the pneumonia.  Upon discharge, the methotrexate could be resumed once the pneumonia is completely resolved.  Dr. Etta Grandchild will follow Mr. Kuss from the urology standpoint with PSAs.  If the patient were to continue having rectal bleed, further follow-up on the patients hemoglobin and perhaps a GI referral may be indicated.  Dr. Foy Guadalajara is to follow up the patients blood pressure and adjust the antihypertensive agents (Lotensin, new to the patient) as needed.  CONSULTATIONS:  None.  PROCEDURES:  None.  DISCHARGE LAB DATA:  Hemoglobin 12.2, MCV 86, wbc 11.3 (admission wbc 18.9), platelets 162.  Sodium 139, potassium 3.5 (improved from 3.4), chloride 109, CO2 25, BUN 15, creatinine 1.1, glucose 102.  Magnesium level 2.1.  Sputum culture pending.  Blood cultures negative x 2 x 3 days.  Chest x-ray consistent with right middle lobe infiltrate with increased aeration compared to the previous one.  There is underlying chronic lung disease (mild pulmonary fibrosis).  HOSPITAL COURSE:  Mr. Hoskin is a 77 year old gentleman admitted  to [facility] [employer] on May 06, 2001, with pneumonia and hypoxia. Please see admission H&P by Gracelyn Nurse, M.D., for further details regarding the history of present illness, physical examination and lab data.  #1 - RIGHT MIDDLE LOBE COMMUNITY ACQUIRED PNEUMONIA COMPLICATED BY ACUTE CHRONIC OBSTRUCTIVE PULMONARY DISEASE EXACERBATION:  The patient was admitted to the hospital to receive intravenous antibiotic therapy.  Blood and  sputum samples were obtained prior to the initiation of intravenous Tequin. Methotrexate was held due to the  concurrent infectious process.  No evidence of adrenal crisis were noticed clinically. The sputum Gram stain was consistent with gram positive cocci in pairs.  The sputum culture still was pending at the time of discharge.  The blood cultures remained sterile throughout his hospital stay.  Bronchodilator treatments around the clock also was used to help with the patients bronchospasm.  On day #2, we added prednisone to help with the patients bronchospasm as well as to prevent adrenal crisis since the patient was taking prednisone every other day due to his severe eczema.  The patients fever which was 101.7 started to improve by day #2 and then resolved by day #3.  The patients Tequin was switched to tablets the day before discharge without complications.  Nebulizer treatments were switched to MDIs also without complications.  The bronchospasm showed an improvement pattern throughout this hospital stay.  On the day of discharge, the patients oxygen saturation was 94% on room air and there was no evidence of bronchospasm.  A chest x-ray showed increased aeration in the right middle lobe infiltrate.  Noticed that it was an underlying mild pulmonary fibrosis. Also steroid therapy was decreased on the day of discharge without complications.  #2 - DEHYDRATION AND HYPOKALEMIA:  The patient required intravenous hydration therapy along with potassium supplementation during this hospital stay.  At the time of discharge there was no evidence of dehydration.  The last serum potassium level was 3.5 the day prior to discharge.  The patient received an additional dose of K-Dur.  We recommended following the patients serum potassium level at some point down the road.  #3 - RADIATION PROCTITIS:  The patient had been complaining of some mild rectal bleed one to two weeks prior to admission.  The patient was seen by Dr.  ____________ and Claudette Laws, M.D., for this problem.  Both physicians agreed  that this was likely radiation proctitis.  The patients hemoglobin remained stable.  There was slight decline of the hemoglobin noticed, though this was felt to be associated with IV fluids (hemodilution or effect).  The last hemoglobin prior to discharge was 12.2.  This hemoglobin will be followed up on discharge.  If the patient reports further bleed, a GI referral may be indicated.  #4 - HYPERTENSION:  During this hospital stay, we started the patient on Lotensin with good results.  The patients blood pressure will be followed by Dr. Foy Guadalajara upon discharge and Lotensin may be adjusted as needed.  #5 - SEVERE ECZEMA:  As described above, methotrexate was held during this hospitalization.  Prednisone was continued.  Once the pneumonia resolves completely, methotrexate will be resumed.  I spent about 40 minutes on the discharge process of this patient.  MEDICATION CONDITION AT TIME OF DISCHARGE:  Improved. Dictated by:   Rosanne Sack, M.D. Attending Physician:  Rosanne Sack DD:  05/09/01 TD:  05/09/01 Job: [id number]

## 2010-09-24 NOTE — Discharge Summary (Signed)
NAMEDARRAN, [name] NO.:  [id number]   MEDICAL RECORD NO.:  [id number]          PATIENT TYPE:  INP   LOCATION:  0444                         FACILITY:  [facility]   PHYSICIAN:  Kela Millin, M.D.DATE OF BIRTH:  09-Jan-1923   DATE OF ADMISSION:  07/18/2004  DATE OF DISCHARGE:  07/26/2004                           DISCHARGE SUMMARY - REFERRING   DISCHARGE DIAGNOSES:  1.  Right lower lobe pneumonia.  2.  Asthma/chronic obstructive pulmonary disease exacerbation - secondary to      #1.  3.  Hypertension.  4.  History of dementia.  5.  History of prostate cancer, status post radical prostatectomy and      radiation therapy three years ago.  6.  History of eczema.  7.  History of Guillain-Barre syndrome.   STUDIES:  1.  CT of chest stable.  Left upper lobe nodule most consistent with an area      of pleural parenchymal scarring.  Cylindrical bronchiectatic changes      within the right lower lobe with peribronchial thickening and mild      peribronchial infiltrate within the right lower lobe.  A 1.3 cm incised      indeterminate hypodense splenic lesion.  Question changes of cirrhosis      and mild splenomegaly.  2.  CT scan of head - Atrophy and advanced chronic small vessel ischemic      changes, no acute abnormality.  3.  EEG - Normal study in the awake and slightly drowsy state.   HISTORY OF PRESENT ILLNESS:  The patient is a 77 year old white male with  history of dementia, question chronic obstructive pulmonary disease, asthma,  hypertension, eczema who presented with weakness.  According to the wives,  the patient had been doing fine until the night prior to presentation when  he began moaning most of the night.  He got up and went to the bathroom,  when she went and found him on the floor.  He was able to get up and walk to  the dining room, however, when he sat down he fell backwards and hit his  head.  They also reported that patient has had some  shortness of breath.  In  addition he had also been more confused on the day of presentation than  usual.  Per patient's wife, at his baseline, he was able to converse, feed  himself, walk around with a walker and do his activities of daily living.   PHYSICAL EXAMINATION UPON ADMISSION:  As per Dr. Jomarie Longs:  VITAL SIGNS:  Temperature 101.4, blood pressure 148/91 with pulse of 106,  respiratory rate 24, O2 saturation 96% on two liters.  LUNGS:  Clear to auscultation on admission.  SKIN:  He had dry skin in multiple areas with erythematous areas as well  that was scaly but no signs of infection.  NEUROLOGIC:  He was alert and otherwise nonfocal.   LABORATORY DATA:  His white cell count was 16 with hemoglobin of 15,  hematocrit 42, platelet count 136.  Sodium 135, potassium 4, chloride 105,  bicarb 24, glucose 110, BUN 20,  creatinine 1.3.  Total protein 6.6, albumin  3.6.   His chest x-ray showed chronic interstitial lung disease, no acute  infiltrates and a left upper lobe nodule.  A CT scan of the chest was done  as recommended and the results are as stated above with the upper lobe  nodule area noted to be most consistent with an area of pleural parenchymal  scarring.   HOSPITAL COURSE:  PROBLEM #1 -  RIGHT LOWER LOBE PNEUMONIA:  Upon admission,  the patient was started on IV antibiotics and also nebulized  bronchodilators.  It was noted that the patient had had a swallowing study  done when he was hospitalized in December and that was negative for  aspiration/dysphagia.  The patient improved with the above interventions -  defervesced, and oxygenating well on room air.  Last ABG on room air  revealed a pH of 7.44 with pCO2 of 31.1, pO2 of 72.5, O2 saturation 96.2.  The patient will be discharged on oral antibiotics to complete a 14-day  course of antibiotics and he is to follow up with his primary care  physician.   PROBLEM #2 -  ASTHMA/CHRONIC OBSTRUCTIVE PULMONARY DISEASE  EXACERBATION:  Secondary to #1.  As already discussed, the patient was started on nebulized  bronchodilators upon admission as well as antibiotics.  He was also  continued on his Singulair and Advair.  The patient's shortness of breath  resolved with this treatment and he has been oxygenating well on room air as  stated above and he is tolerating p.o. well and has remained hemodynamically  stable.  He will be discharged at this time to follow up with his primary  care physician as well as the skilled nursing facility physician.   PROBLEM #3 -  HYPERTENSION:  The patient was maintained on Procardia for  blood pressure control while he was in the hospital.   PROBLEM #4 -  DECONDITIONING:  The patient was evaluated by PT as well as  occupational therapy while he was in the hospital and short-term skilled  nursing rehab was recommended.  The patient was followed by PT/OT during his  hospital stay.   PROBLEM #5 -  QUESTION OF SEIZURE:  The patient had an episode while he was  sleeping where his wife and friend noted that he was moving his legs as if  he was seizing, except his upper body was still and this lasted about five  seconds.  The patient had never had seizures and he did not have any urinary  or stool incontinence.  He awakened following that episode and did not  appear postictal.  Evaluation included the prolactin level which was normal  at 14.7, a CT scan of his head as well as an EEG were done and the EEG was  within normal limits, also the CT scan did not show any acute intracranial  findings.  The patient did not have any similar episodes for the rest of his  hospital stay, and it is unlikely that it was a seizure that he experienced  based on the above studies.   DISCHARGE MEDICATIONS:  1.  Aspirin 81 mg p.o. daily.  2.  Albuterol and Atrovent nebulizers q.6h. p.r.n.  3.  Advair 250/50 mg one puff b.i.d.  4.  Megace 800 mg p.o. daily. 5.  Singulair 10 mg p.o. daily.  6.   Avelox 400 mg p.o. daily x5 more days.  7.  Procardia XL 30 mg p.o. daily.  8.  Enablex  7.5 mg p.o. daily.  9.  Razadyne 24 mg p.o. daily.  10. Protonix 40 mg p.o. daily.  11. Zoloft 100 mg p.o. daily.  12. Iron 325 mg one p.o. daily.  13. Methotrexate 2.5 mg q. Wednesday.   FOLLOW UP CARE:  1.  Nursing home physician in two to three days.  2.  Primary care physician - Dr. Dewaine Oats, in one week.   CONDITION ON DISCHARGE:  Improved/stable.      ACV/MEDQ  D:  07/26/2004  T:  07/26/2004  Job:  [id number]   cc:   Molly Maduro L. Foy Guadalajara, M.D.  [address] [place]  [place] [address]  Fax: [phone]

## 2011-01-03 ENCOUNTER — Emergency Department (HOSPITAL_COMMUNITY): Payer: Medicare Other

## 2011-01-03 ENCOUNTER — Inpatient Hospital Stay (HOSPITAL_COMMUNITY)
Admission: EM | Admit: 2011-01-03 | Discharge: 2011-01-06 | DRG: 641 | Disposition: A | Discharge status: Skilled Nursing Facility | Payer: Medicare Other | Attending: Internal Medicine | Admitting: Internal Medicine

## 2011-01-03 DIAGNOSIS — H3530 Unspecified macular degeneration: Secondary | ICD-10-CM | POA: Diagnosis present

## 2011-01-03 DIAGNOSIS — Z7982 Long term (current) use of aspirin: Secondary | ICD-10-CM

## 2011-01-03 DIAGNOSIS — K5900 Constipation, unspecified: Secondary | ICD-10-CM | POA: Diagnosis present

## 2011-01-03 DIAGNOSIS — Z9181 History of falling: Secondary | ICD-10-CM

## 2011-01-03 DIAGNOSIS — R031 Nonspecific low blood-pressure reading: Secondary | ICD-10-CM | POA: Diagnosis present

## 2011-01-03 DIAGNOSIS — S32009A Unspecified fracture of unspecified lumbar vertebra, initial encounter for closed fracture: Secondary | ICD-10-CM | POA: Diagnosis present

## 2011-01-03 DIAGNOSIS — D72829 Elevated white blood cell count, unspecified: Secondary | ICD-10-CM | POA: Diagnosis present

## 2011-01-03 DIAGNOSIS — G309 Alzheimer's disease, unspecified: Secondary | ICD-10-CM | POA: Diagnosis present

## 2011-01-03 DIAGNOSIS — F0280 Dementia in other diseases classified elsewhere without behavioral disturbance: Secondary | ICD-10-CM | POA: Diagnosis present

## 2011-01-03 DIAGNOSIS — N289 Disorder of kidney and ureter, unspecified: Secondary | ICD-10-CM | POA: Diagnosis present

## 2011-01-03 DIAGNOSIS — Z86718 Personal history of other venous thrombosis and embolism: Secondary | ICD-10-CM

## 2011-01-03 DIAGNOSIS — I10 Essential (primary) hypertension: Secondary | ICD-10-CM | POA: Diagnosis present

## 2011-01-03 DIAGNOSIS — J4489 Other specified chronic obstructive pulmonary disease: Secondary | ICD-10-CM | POA: Diagnosis present

## 2011-01-03 DIAGNOSIS — E860 Dehydration: Principal | ICD-10-CM | POA: Diagnosis present

## 2011-01-03 DIAGNOSIS — R55 Syncope and collapse: Secondary | ICD-10-CM | POA: Diagnosis present

## 2011-01-03 DIAGNOSIS — J449 Chronic obstructive pulmonary disease, unspecified: Secondary | ICD-10-CM | POA: Diagnosis present

## 2011-01-03 LAB — URINALYSIS, ROUTINE W REFLEX MICROSCOPIC
Bilirubin Urine: NEGATIVE
Hgb urine dipstick: NEGATIVE
Ketones, ur: 15 mg/dL — AB
Protein, ur: NEGATIVE mg/dL
Specific Gravity, Urine: 1.021 (ref 1.005–1.030)
Urobilinogen, UA: 0.2 mg/dL (ref 0.0–1.0)

## 2011-01-03 LAB — CBC
MCH: 29.5 pg (ref 26.0–34.0)
MCHC: 34.9 g/dL (ref 30.0–36.0)
MCV: 84.5 fL (ref 78.0–100.0)
Platelets: 134 10*3/uL — ABNORMAL LOW (ref 150–400)
RDW: 14 % (ref 11.5–15.5)

## 2011-01-03 LAB — BASIC METABOLIC PANEL
BUN: 23 mg/dL (ref 6–23)
Calcium: 9.1 mg/dL (ref 8.4–10.5)
Creatinine, Ser: 1.2 mg/dL (ref 0.50–1.35)
GFR calc non Af Amer: 57 mL/min — ABNORMAL LOW (ref >60)
Glucose, Bld: 160 mg/dL — ABNORMAL HIGH (ref 70–99)

## 2011-01-03 LAB — DIFFERENTIAL
Basophils Relative: 0 % (ref 0–1)
Eosinophils Absolute: 0.2 10*3/uL (ref 0.0–0.7)
Eosinophils Relative: 2 % (ref 0–5)
Lymphs Abs: 1 10*3/uL (ref 0.7–4.0)
Monocytes Relative: 10 % (ref 3–12)

## 2011-01-03 LAB — CK TOTAL AND CKMB (NOT AT ARMC): CK, MB: 1.8 ng/mL (ref 0.3–4.0)

## 2011-01-03 LAB — POCT I-STAT TROPONIN I

## 2011-01-03 MED ORDER — IOHEXOL 300 MG/ML  SOLN
100.0000 mL | Freq: Once | INTRAMUSCULAR | Status: AC | PRN
  Administered 2011-01-03: 100 mL via INTRAVENOUS

## 2011-01-03 NOTE — H&P (Signed)
NAME:  [name], [name] NO.:  [id number]  MEDICAL RECORD NO.:  [id number]  LOCATION:  [facility]                         FACILITY:  [facility]  PHYSICIAN:  Andreas Blower, MD       DATE OF BIRTH:  Jan 05, 1923  DATE OF ADMISSION:  01/03/2011 DATE OF DISCHARGE:                             HISTORY & PHYSICAL   PRIMARY CARE PHYSICIAN:  Robert L. Foy Guadalajara, MD.  CHIEF COMPLAINT:  Syncope.  HISTORY OF PRESENT ILLNESS:  Mr. Rivenburg is an 77 year old Caucasian male with a history of hypertension, COPD, Alzheimer's dementia, pneumonia, and history of DVT who presents with the above complaints. The patient reports that he has been in his usual state of health. About 4 or 5 days ago while he was visiting his wife at a rehab center, he slipped and fell and landed on his bottom.  He was eventually helped to the rehab center by one of the personnel from the rehab center. Today, around 10:15 a.m. while the patient was going to the [facility] for adult day care, he had an episode where he lost consciousness. His son was behind him and before Mr. Mcpartlin was able to hit the floor his son caught him.  After 30 seconds to a minute, the patient came to consciousness.  On my discussion with the patient, he does not recall losing consciousness, but the son reports that the patient had his eyes rolled back and did indeed lose consciousness.  Prior to this event, the patient does not recall any preceding symptoms.  He did feel cold and clammy afterwards.  When the EMS arrived to evaluate him, his blood pressure was in the 80s over 50, and he was started on IV hydration. Since then, his blood pressure has improved to 104/58 in the ER.  Given his syncopal event, the hospitalist service was asked to admit the patient for further management.  The patient denies any chest pain, shortness of breath.  Denies any recent fevers or chills.  Denies any nausea or vomiting.  Denies any abdominal pain.   Denies any headaches or vision changes.  Does complain of low back pain from the recent fall.  REVIEW OF SYSTEMS:  All systems were reviewed with the patient and was positive as per HPI, otherwise all other systems are negative.  PAST MEDICAL HISTORY: 1. Asthma. 2. COPD. 3. Hypertension. 4. History of DVT. 5. Pneumonia. 6. Alzheimer's dementia.  SOCIAL HISTORY:  The patient quit smoking about 20 years ago.  The patient drinks alcohol once a week.  He uses a cane in the house.  FAMILY HISTORY:  Both mother and father are deceased.  The patient is unsure what they died from.  PHYSICAL EXAMINATION:  VITAL SIGNS:  Temperature 97.5, blood pressure 116/65, heart rate 61, respirations 10, and saturating at 96% on room air. GENERAL:  The patient was alert and oriented, not in acute distress, was lying in bed comfortably. HEENT: Extraocular motions are intact.  Pupils equal and round.  He had slightly dry mucous membranes. NECK:  Supple. HEART:  Regular with S1 and S2. LUNGS:  Clear to auscultation bilaterally. ABDOMEN:  Soft, nontender, and nondistended.  Positive bowel sounds.  EXTREMITIES:  The patient had good peripheral pulses with trace edema. NEUROLOGIC:  Cranial nerves 2 through 12 grossly intact.  Had 5/5 motor strength in the upper as well as lower extremities.  RADIOLOGY/IMAGING:  The patient had a chest x-ray 2-view which showed no acute findings.  Mild basilar fibrosis.  The patient had a head CT without contrast which showed prominent small vessel disease type changes without CT evidence for a large acute infarct or intracranial hemorrhage.  EKG shows left bundle branch block which appears to be unchanged.  LABORATORY DATA:  CBC shows a white count of 10.9, hemoglobin 14.1, hematocrit 40.4, and platelet count 134,000.  INR 1.18.  Electrolytes normal with a BUN of 23 and creatinine 1.2.  ASSESSMENT/PLAN: 1. Syncope, etiology unclear.  Suspect it may be a  combination of     dehydration and/or due to medications as the patient was     hypotensive initially when the EMS presented to the scene.  We will     check orthostatics on admission.  We will also hydrate the patient     on IV fluids.  Head CT is negative.  We will send for a 2D     echocardiogram and carotid Dopplers for further evaluation.  We     will also trend troponins and we will have the patient on telemetry     to observe for any arrhythmias.  We will send for a D-dimer.  If     positive, we will get a CT of the chest with contrast to evaluate     for PE (given history of DVT in the past). 2. Leukocytosis.  Monitor for now.  The patient has low suspicion that     this is infectious in etiology. Chest x-ray is negative.     We will send for UA and urine culture. 3. Mild renal insufficiency, likely due to dehydration.  We will     hydrate the patient and we will reassess. 4. Dehydration.  We will hydrate the patient and reassess. 5. Hypertension.  We will hold the patient's nifedipine as initially     the patient was hypotensive. His blood pressure has been     adequate in the emergency department.  If his blood pressure     remains stable, may no longer need nifedipine to control his blood     pressure. 6. Prophylaxis.  Lovenox for DVT prophylaxis. 7. Code status.  The patient is full code.  This was discussed with     the patient and family at the time of admission.  Time spent on admission, talking to the patient and family, and coordinating care was 50 minutes.   Andreas Blower, MD   SR/MEDQ  D:  01/03/2011  T:  01/03/2011  Job:  [id number]  Electronically Signed by Wardell Heath Aleksia Freiman  on 01/03/2011 08:21:12 PM

## 2011-01-04 DIAGNOSIS — R55 Syncope and collapse: Secondary | ICD-10-CM

## 2011-01-04 LAB — URINE CULTURE
Culture  Setup Time: 201208271653
Culture: NO GROWTH

## 2011-01-04 LAB — CBC
HCT: 37.6 % — ABNORMAL LOW (ref 39.0–52.0)
Hemoglobin: 12.5 g/dL — ABNORMAL LOW (ref 13.0–17.0)
MCV: 84.5 fL (ref 78.0–100.0)
Platelets: 131 10*3/uL — ABNORMAL LOW (ref 150–400)
RBC: 4.45 MIL/uL (ref 4.22–5.81)
WBC: 7.9 10*3/uL (ref 4.0–10.5)

## 2011-01-04 LAB — BASIC METABOLIC PANEL
Chloride: 107 mEq/L (ref 96–112)
Creatinine, Ser: 1.06 mg/dL (ref 0.50–1.35)
GFR calc Af Amer: >60 mL/min (ref >60)
Sodium: 140 mEq/L (ref 135–145)

## 2011-01-04 LAB — CARDIAC PANEL(CRET KIN+CKTOT+MB+TROPI)
Relative Index: INVALID (ref 0.0–2.5)
Troponin I: <0.3 ng/mL (ref <0.30)

## 2011-01-05 LAB — BASIC METABOLIC PANEL
BUN: 17 mg/dL (ref 6–23)
Calcium: 9.6 mg/dL (ref 8.4–10.5)
Creatinine, Ser: 1.16 mg/dL (ref 0.50–1.35)
GFR calc Af Amer: >60 mL/min (ref >60)
GFR calc non Af Amer: 60 mL/min — ABNORMAL LOW (ref >60)
Glucose, Bld: 93 mg/dL (ref 70–99)
Potassium: 4 mEq/L (ref 3.5–5.1)

## 2011-01-05 LAB — CARDIAC PANEL(CRET KIN+CKTOT+MB+TROPI)
CK, MB: 2.1 ng/mL (ref 0.3–4.0)
Total CK: 21 U/L (ref 7–232)
Troponin I: <0.3 ng/mL (ref <0.30)

## 2011-01-05 LAB — CBC
HCT: 39.8 % (ref 39.0–52.0)
MCHC: 33.9 g/dL (ref 30.0–36.0)
RDW: 13.9 % (ref 11.5–15.5)
WBC: 7.1 10*3/uL (ref 4.0–10.5)

## 2011-01-06 LAB — BASIC METABOLIC PANEL
BUN: 24 mg/dL — ABNORMAL HIGH (ref 6–23)
Calcium: 9.4 mg/dL (ref 8.4–10.5)
Chloride: 105 mEq/L (ref 96–112)
Creatinine, Ser: 1.17 mg/dL (ref 0.50–1.35)
GFR calc Af Amer: >60 mL/min (ref >60)
GFR calc non Af Amer: 59 mL/min — ABNORMAL LOW (ref >60)
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
## 2012-02-09 ENCOUNTER — Inpatient Hospital Stay (HOSPITAL_COMMUNITY)
Admission: AD | Admit: 2012-02-09 | Discharge: 2012-02-13 | DRG: 193 | Disposition: A | Discharge status: Skilled Nursing Facility | Payer: Medicare Other | Source: Ambulatory Visit | Attending: Internal Medicine | Admitting: Internal Medicine

## 2012-02-09 ENCOUNTER — Encounter (HOSPITAL_COMMUNITY): Payer: Self-pay | Admitting: General Practice

## 2012-02-09 ENCOUNTER — Observation Stay (HOSPITAL_COMMUNITY): Payer: Medicare Other

## 2012-02-09 DIAGNOSIS — R0902 Hypoxemia: Secondary | ICD-10-CM | POA: Diagnosis present

## 2012-02-09 DIAGNOSIS — Z86718 Personal history of other venous thrombosis and embolism: Secondary | ICD-10-CM

## 2012-02-09 DIAGNOSIS — K219 Gastro-esophageal reflux disease without esophagitis: Secondary | ICD-10-CM | POA: Diagnosis present

## 2012-02-09 DIAGNOSIS — Z681 Body mass index (BMI) 19 or less, adult: Secondary | ICD-10-CM

## 2012-02-09 DIAGNOSIS — F0280 Dementia in other diseases classified elsewhere without behavioral disturbance: Secondary | ICD-10-CM | POA: Diagnosis present

## 2012-02-09 DIAGNOSIS — Z9089 Acquired absence of other organs: Secondary | ICD-10-CM

## 2012-02-09 DIAGNOSIS — R05 Cough: Secondary | ICD-10-CM

## 2012-02-09 DIAGNOSIS — R197 Diarrhea, unspecified: Secondary | ICD-10-CM

## 2012-02-09 DIAGNOSIS — Z8249 Family history of ischemic heart disease and other diseases of the circulatory system: Secondary | ICD-10-CM

## 2012-02-09 DIAGNOSIS — I10 Essential (primary) hypertension: Secondary | ICD-10-CM | POA: Diagnosis present

## 2012-02-09 DIAGNOSIS — J189 Pneumonia, unspecified organism: Principal | ICD-10-CM | POA: Diagnosis present

## 2012-02-09 DIAGNOSIS — F0390 Unspecified dementia without behavioral disturbance: Secondary | ICD-10-CM

## 2012-02-09 DIAGNOSIS — N179 Acute kidney failure, unspecified: Secondary | ICD-10-CM | POA: Diagnosis present

## 2012-02-09 DIAGNOSIS — E860 Dehydration: Secondary | ICD-10-CM | POA: Diagnosis present

## 2012-02-09 DIAGNOSIS — R112 Nausea with vomiting, unspecified: Secondary | ICD-10-CM

## 2012-02-09 DIAGNOSIS — R32 Unspecified urinary incontinence: Secondary | ICD-10-CM | POA: Diagnosis present

## 2012-02-09 DIAGNOSIS — J449 Chronic obstructive pulmonary disease, unspecified: Secondary | ICD-10-CM | POA: Diagnosis present

## 2012-02-09 DIAGNOSIS — G309 Alzheimer's disease, unspecified: Secondary | ICD-10-CM | POA: Diagnosis present

## 2012-02-09 DIAGNOSIS — J4489 Other specified chronic obstructive pulmonary disease: Secondary | ICD-10-CM | POA: Diagnosis present

## 2012-02-09 DIAGNOSIS — E41 Nutritional marasmus: Secondary | ICD-10-CM | POA: Diagnosis present

## 2012-02-09 HISTORY — DX: Personal history of other medical treatment: Z92.89

## 2012-02-09 HISTORY — DX: Depression, unspecified: F32.A

## 2012-02-09 HISTORY — DX: Unspecified chronic bronchitis: J42

## 2012-02-09 HISTORY — DX: Dyspnea, unspecified: R06.00

## 2012-02-09 HISTORY — DX: Dementia in other diseases classified elsewhere, unspecified severity, without behavioral disturbance, psychotic disturbance, mood disturbance, and anxiety: F02.80

## 2012-02-09 HISTORY — DX: Unspecified osteoarthritis, unspecified site: M19.90

## 2012-02-09 HISTORY — DX: Pneumonia, unspecified organism: J18.9

## 2012-02-09 HISTORY — DX: Pure hypercholesterolemia, unspecified: E78.00

## 2012-02-09 HISTORY — DX: Malignant neoplasm of prostate: C61

## 2012-02-09 HISTORY — DX: Chronic obstructive pulmonary disease, unspecified: J44.9

## 2012-02-09 HISTORY — DX: Gastro-esophageal reflux disease without esophagitis: K21.9

## 2012-02-09 HISTORY — DX: Unspecified asthma, uncomplicated: J45.909

## 2012-02-09 HISTORY — DX: Other forms of dyspnea: R06.09

## 2012-02-09 HISTORY — DX: Acute embolism and thrombosis of unspecified deep veins of unspecified lower extremity: I82.409

## 2012-02-09 HISTORY — DX: Major depressive disorder, single episode, unspecified: F32.9

## 2012-02-09 HISTORY — DX: Essential (primary) hypertension: I10

## 2012-02-09 HISTORY — DX: Alzheimer's disease, unspecified: G30.9

## 2012-02-09 LAB — CBC
HCT: 41.1 % (ref 39.0–52.0)
Hemoglobin: 13.8 g/dL (ref 13.0–17.0)
MCH: 29.3 pg (ref 26.0–34.0)
MCHC: 33.6 g/dL (ref 30.0–36.0)
MCV: 87.3 fL (ref 78.0–100.0)
Platelets: 182 K/uL (ref 150–400)
RBC: 4.71 MIL/uL (ref 4.22–5.81)
RDW: 14.7 % (ref 11.5–15.5)
WBC: 23 K/uL — ABNORMAL HIGH (ref 4.0–10.5)

## 2012-02-09 LAB — COMPREHENSIVE METABOLIC PANEL
ALT: 9 U/L (ref 0–53)
AST: 16 U/L (ref 0–37)
Albumin: 3.6 g/dL (ref 3.5–5.2)
CO2: 23 mEq/L (ref 19–32)
Calcium: 9.9 mg/dL (ref 8.4–10.5)
Creatinine, Ser: 1.42 mg/dL — ABNORMAL HIGH (ref 0.50–1.35)
GFR calc non Af Amer: 43 mL/min — ABNORMAL LOW (ref >90)
Sodium: 138 mEq/L (ref 135–145)
Total Protein: 7.8 g/dL (ref 6.0–8.3)

## 2012-02-09 MED ORDER — DEXTROSE 5 % IV SOLN
500.0000 mg | INTRAVENOUS | Status: DC
  Filled 2012-02-09: qty 500

## 2012-02-09 MED ORDER — SODIUM CHLORIDE 0.9 % IV SOLN
INTRAVENOUS | Status: DC
  Administered 2012-02-09 – 2012-02-10 (×2): via INTRAVENOUS

## 2012-02-09 MED ORDER — HEPARIN SODIUM (PORCINE) 5000 UNIT/ML IJ SOLN
5000.0000 [IU] | Freq: Three times a day (TID) | INTRAMUSCULAR | Status: DC
  Administered 2012-02-09 – 2012-02-13 (×11): 5000 [IU] via SUBCUTANEOUS
  Filled 2012-02-09 (×15): qty 1

## 2012-02-09 MED ORDER — ACETAMINOPHEN 325 MG PO TABS: 650.0000 mg | ORAL_TABLET | Freq: Four times a day (QID) | ORAL | Status: DC | PRN

## 2012-02-09 MED ORDER — ALBUTEROL SULFATE (5 MG/ML) 0.5% IN NEBU
2.5000 mg | INHALATION_SOLUTION | RESPIRATORY_TRACT | Status: DC | PRN
  Filled 2012-02-09: qty 0.5

## 2012-02-09 MED ORDER — ONDANSETRON HCL 4 MG PO TABS: 4.0000 mg | ORAL_TABLET | Freq: Four times a day (QID) | ORAL | Status: DC | PRN

## 2012-02-09 MED ORDER — ONDANSETRON HCL 4 MG/2ML IJ SOLN: 4.0000 mg | Freq: Four times a day (QID) | INTRAMUSCULAR | Status: DC | PRN

## 2012-02-09 MED ORDER — IPRATROPIUM BROMIDE 0.02 % IN SOLN
0.5000 mg | Freq: Four times a day (QID) | RESPIRATORY_TRACT | Status: DC
  Administered 2012-02-09 – 2012-02-12 (×10): 0.5 mg via RESPIRATORY_TRACT
  Filled 2012-02-09 (×12): qty 2.5

## 2012-02-09 MED ORDER — ACETAMINOPHEN 650 MG RE SUPP: 650.0000 mg | Freq: Four times a day (QID) | RECTAL | Status: DC | PRN

## 2012-02-09 MED ORDER — ALBUTEROL SULFATE (5 MG/ML) 0.5% IN NEBU
2.5000 mg | INHALATION_SOLUTION | Freq: Four times a day (QID) | RESPIRATORY_TRACT | Status: DC
  Administered 2012-02-09 – 2012-02-12 (×10): 2.5 mg via RESPIRATORY_TRACT
  Filled 2012-02-09 (×11): qty 0.5

## 2012-02-09 MED ORDER — LEVOFLOXACIN IN D5W 750 MG/150ML IV SOLN
750.0000 mg | INTRAVENOUS | Status: DC
  Administered 2012-02-09: 750 mg via INTRAVENOUS
  Filled 2012-02-09 (×2): qty 150

## 2012-02-09 MED ORDER — SODIUM CHLORIDE 0.9 % IJ SOLN
3.0000 mL | Freq: Two times a day (BID) | INTRAMUSCULAR | Status: DC
  Administered 2012-02-10 – 2012-02-13 (×6): 3 mL via INTRAVENOUS

## 2012-02-09 MED ORDER — DEXTROSE 5 % IV SOLN
1.0000 g | INTRAVENOUS | Status: DC
  Filled 2012-02-09: qty 10

## 2012-02-09 NOTE — Progress Notes (Signed)
Received a call from Ms. Gillermina Hu, NP with Dr. Marinda Elk for direct admission of this patient. Patient is a 77 year old male with history of COPD, not on home oxygen, depression, dementia, prostate cancer, GERD who recently was on a cruise with his wife, presented to PCPs office with couple of days of nausea, vomiting and intermittent diarrhea, decreased appetite and lethargy. Patient was apparently in no distress. Vital signs: Temperature 97.9, pulse 84 per minute, blood pressure 116/64 and oxygen saturations of 89-90% on room air. UA was suggestive of urinary tract infection. Accepted patient for direct admission to a telemetry bed, in patient status for evaluation and management of possible acute GE, dehydration, presumed UTI and rule out renal insufficiency.  Oneda Duffett 4:05 PM

## 2012-02-09 NOTE — Progress Notes (Addendum)
[facility] History and Physical  [name]:[id number] DOB: 07-26-1922 DOA: 02/09/2012  Referring physician: PA  PCP: Lenora Boys, MD    Chief Complaint: no appetite, n/v/d  HPI: [name] is a 78 y.o. male  Who has just returned from a cruise to the [place].  He has a history of COPD, not on home oxygen, depression, dementia, prostate cancer, GERD.  He came to the PCP's office with: couple of days of nausea, vomiting x1 and intermittent diarrhea x at least 1 today, decreased appetite and lethargy for a few days.  This started a few days after returning from the cruise.  No known sick contacts.   At the PCPs office he was found to have: Temperature 97.9, pulse 84 per minute, blood pressure 116/64 and oxygen saturations of 89-90% on room air. UA was suggestive of urinary tract infection.  He is normally ambulatory with a rolling walker  + cough, + runny nose No fever No chills No recent abx  Review of Systems: all systems reviewed, negative unless stated above  PAST MEDICAL HISTORY:  1. Asthma.  2. COPD.  3. Hypertension.  4. History of DVT.  5. Pneumonia.  6. Alzheimer's dementia    No past surgical history on file. Social History: not currently smoking, no drugs/alcohol From home, has wife, ambulates with rolling walker  Allergies: latex   family hx: +CAD  Prior to Admission medications   Not on File   Physical Exam: There were no vitals filed for this visit.   General:  Pleasant/coopertaive, hard of hearing  Eyes: pinkness around left eye with some clear drainage  ENT: hearing aids in place  Neck: supple, no JVD  Cardiovascular: rrr  Respiratory: clear anterior  Abdomen: +BS, soft, NT/ND  Skin: senile changes, no breakdown noted on exam  Musculoskeletal: moves all 4 extremities, mildly shuffling gate  Psychiatric: normal mood/affect  Neurologic: CN 2-12 grossly intact  Labs on Admission:  Basic  Metabolic Panel: No results found for this basename: NA:5,K:5,CL:5,CO2:5,GLUCOSE:5,BUN:5,CREATININE:5,CALCIUM:5,MG:5,PHOS:5 in the last 168 hours Liver Function Tests: No results found for this basename: AST:5,ALT:5,ALKPHOS:5,BILITOT:5,PROT:5,ALBUMIN:5 in the last 168 hours No results found for this basename: LIPASE:5,AMYLASE:5 in the last 168 hours No results found for this basename: AMMONIA:5 in the last 168 hours CBC: No results found for this basename: WBC:5,NEUTROABS:5,HGB:5,HCT:5,MCV:5,PLT:5 in the last 168 hours Cardiac Enzymes: No results found for this basename: CKTOTAL:5,CKMB:5,CKMBINDEX:5,TROPONINI:5 in the last 168 hours  BNP (last 3 results) No results found for this basename: PROBNP:3 in the last 8760 hours CBG: No results found for this basename: GLUCAP:5 in the last 168 hours  Radiological Exams on Admission: No results found.  EKG: Independently reviewed. Not done yet  Assessment/Plan Active Problems:  Nausea & vomiting  Diarrhea  Hypoxia  Cough  Urinary incontinence  HTN (hypertension)  COPD (chronic obstructive pulmonary disease)  Dementia   1. Patient directly admitted to floor, place on tele, U/A ordered, CBC, CMP ordered, chest x ray, IVF given, await labs for further recommendations, will go ahead and start abx for possible PNA/U/A,- levaquin dosed for PNA (allergic to PCN)  depending on renal fxn may need to be changed.   EKG ordered for baseline    Code Status: full Family Communication: wife at bedside Disposition Plan: 1-2 days  Time spent: > 70 min  VANN, JESSICA [facility] Pager [phone]  If 7PM-7AM, please contact night-coverage [email or web address] Password [facility] 02/09/2012, 5:44 PM

## 2012-02-09 NOTE — H&P (Signed)
[facility] History and Physical  [name] ZOX:[id number] DOB: 14-May-1922 DOA: 02/09/2012  Referring physician: PA  PCP: Lenora Boys, MD    Chief Complaint: no appetite, n/v/d  HPI: [name] is a 78 y.o. male  Who has just returned from a cruise to the [place].  He has a history of COPD, not on home oxygen, depression, dementia, prostate cancer, GERD.  He came to the PCP's office with: couple of days of nausea, vomiting x1 and intermittent diarrhea x at least 1 today, decreased appetite and lethargy for a few days.  This started a few days after returning from the cruise.  No known sick contacts.   At the PCPs office he was found to have: Temperature 97.9, pulse 84 per minute, blood pressure 116/64 and oxygen saturations of 89-90% on room air. UA was suggestive of urinary tract infection.  He is normally ambulatory with a rolling walker  + cough, + runny nose No fever No chills No recent abx  Review of Systems: all systems reviewed, negative unless stated above  PAST MEDICAL HISTORY:  1. Asthma.  2. COPD.  3. Hypertension.  4. History of DVT.  5. Pneumonia.  6. Alzheimer's dementia    Past Surgical History  Procedure Date  . Tonsillectomy ~ 1938  . Appendectomy 1950's?  . Cataract extraction w/ intraocular lens  implant, bilateral   . Prostatectomy   . Tear duct probing     "have had it done twice; left eye only?" (02/09/2012)  . Eye surgery    Social History: not currently smoking, no drugs/alcohol From home, has wife, ambulates with rolling walker  Allergies: latex   family hx: +CAD  Prior to Admission medications   Not on File   Physical Exam: Filed Vitals:   02/09/12 1752  BP: 126/69  Pulse: 75  Temp: 97.4 F (36.3 C)  TempSrc: Oral  Resp: 20  Height: 5\' 8"  (1.727 m)  Weight: 54.6 kg (120 lb 5.9 oz)  SpO2: 90%     General:  Pleasant/coopertaive, hard of hearing  Eyes: pinkness around left eye with  some clear drainage  ENT: hearing aids in place  Neck: supple, no JVD  Cardiovascular: rrr  Respiratory: clear anterior  Abdomen: +BS, soft, NT/ND  Skin: senile changes, no breakdown noted on exam  Musculoskeletal: moves all 4 extremities, mildly shuffling gate  Psychiatric: normal mood/affect  Neurologic: CN 2-12 grossly intact  Labs on Admission:  Basic Metabolic Panel:  Lab 02/09/12 8119  NA 138  K 4.5  CL 98  CO2 23  GLUCOSE 105*  BUN 39*  CREATININE 1.42*  CALCIUM 9.9  MG --  PHOS --   Liver Function Tests:  Lab 02/09/12 1750  AST 16  ALT 9  ALKPHOS 226*  BILITOT 1.1  PROT 7.8  ALBUMIN 3.6   No results found for this basename: LIPASE:5,AMYLASE:5 in the last 168 hours No results found for this basename: AMMONIA:5 in the last 168 hours CBC:  Lab 02/09/12 1750  WBC 23.0*  NEUTROABS --  HGB 13.8  HCT 41.1  MCV 87.3  PLT 182   Cardiac Enzymes: No results found for this basename: CKTOTAL:5,CKMB:5,CKMBINDEX:5,TROPONINI:5 in the last 168 hours  BNP (last 3 results) No results found for this basename: PROBNP:3 in the last 8760 hours CBG: No results found for this basename: GLUCAP:5 in the last 168 hours  Radiological Exams on Admission: Portable Chest 1 View  02/09/2012  *RADIOLOGY REPORT*  Clinical Data: 78 year old male with cough.  Hypertension.  PORTABLE CHEST - 1 VIEW  Comparison: 01/03/2011 and earlier.  Findings: Portable upright AP view 1824 hours.  Stable lung volumes.  Increased reticulonodular opacity at both lung bases. Stable cardiac size and mediastinal contours.  The upper lobes appear stable and clear.  No pneumothorax.  Pulmonary vascularity appears stable and within normal limits.  No effusion.  IMPRESSION: Increased reticulonodular density at both lung bases suspicious for acute atypical infection.   Original Report Authenticated By: Harley Hallmark, M.D.     EKG: Independently reviewed. Not done yet  Assessment/Plan Active  Problems:  Nausea & vomiting  Diarrhea  Hypoxia  Cough  Urinary incontinence  HTN (hypertension)  COPD (chronic obstructive pulmonary disease)  Dementia   1. Patient directly admitted to floor, place on tele, U/A ordered, CBC, CMP ordered, chest x ray, IVF given, await labs for further recommendations, will go ahead and start abx for possible PNA/U/A,- levaquin dosed for PNA (allergic to PCN)  depending on renal fxn may need to be changed.   EKG ordered for baseline    Code Status: full Family Communication: wife at bedside Disposition Plan: 1-2 days  Time spent: > 70 min  VANN, JESSICA [facility] Pager [phone]  If 7PM-7AM, please contact night-coverage [email or web address] Password [email or web address] 02/09/2012, 7:27 PM

## 2012-02-10 DIAGNOSIS — J189 Pneumonia, unspecified organism: Secondary | ICD-10-CM | POA: Diagnosis present

## 2012-02-10 LAB — URINALYSIS, ROUTINE W REFLEX MICROSCOPIC
Glucose, UA: NEGATIVE mg/dL
Hgb urine dipstick: NEGATIVE
Ketones, ur: 15 mg/dL — AB
Leukocytes, UA: NEGATIVE
Protein, ur: 30 mg/dL — AB
pH: 5 (ref 5.0–8.0)

## 2012-02-10 LAB — URINE MICROSCOPIC-ADD ON

## 2012-02-10 MED ORDER — LEVOFLOXACIN IN D5W 750 MG/150ML IV SOLN
750.0000 mg | INTRAVENOUS | Status: DC
  Administered 2012-02-11: 750 mg via INTRAVENOUS
  Filled 2012-02-10: qty 150

## 2012-02-10 MED ORDER — POLYVINYL ALCOHOL 1.4 % OP SOLN
1.0000 [drp] | OPHTHALMIC | Status: DC | PRN
  Filled 2012-02-10: qty 15

## 2012-02-10 MED ORDER — ENSURE COMPLETE PO LIQD
237.0000 mL | Freq: Three times a day (TID) | ORAL | Status: DC
  Administered 2012-02-10 – 2012-02-13 (×10): 237 mL via ORAL

## 2012-02-10 MED ORDER — IPRATROPIUM BROMIDE 0.02 % IN SOLN: 0.5000 mg | Freq: Four times a day (QID) | RESPIRATORY_TRACT | Status: DC

## 2012-02-10 MED ORDER — TRIAMCINOLONE ACETONIDE 0.025 % EX CREA
TOPICAL_CREAM | Freq: Two times a day (BID) | CUTANEOUS | Status: DC
  Administered 2012-02-10 – 2012-02-13 (×6): via TOPICAL
  Filled 2012-02-10: qty 15

## 2012-02-10 NOTE — Evaluation (Addendum)
Physical Therapy Evaluation Patient Details Name: [name] MRN: [id number] DOB: 1922-09-12 Today's Date: 02/10/2012 Time: 5621-3086 PT Time Calculation (min): 26 min  PT Assessment / Plan / Recommendation Clinical Impression  Pt admitted with N/V after recent [place] cruise. Pt very pleasant and wife present throughout reporting pt just stopped eating and moving around 2 days after the cruise with resultant weakness. Spouse present at home but unable to provide physical assist for pt and pt and spouse agreeable to ST-SNF. Pt with O2 sat 93% on RA at rest and drop to 88% on RA with ambulation. Pt will benefit from acute therapy to maximize mobiily, gait, transfers and function prior to discharge to decrease burden of care.     PT Assessment  Patient needs continued PT services    Follow Up Recommendations  Supervision/Assistance - 24 hour;Post acute inpatient rehab    Barriers to Discharge Decreased caregiver support      Equipment Recommendations  None recommended by PT    Recommendations for Other Services OT consult   Frequency Min 3X/week    Precautions / Restrictions Precautions Precautions: Fall Restrictions Weight Bearing Restrictions: No   Pertinent Vitals/Pain No pain      Mobility  Bed Mobility Bed Mobility: Supine to Sit;Sitting - Scoot to Edge of Bed Supine to Sit: 4: Min assist;HOB elevated;With rails (HOB 20degrees) Sitting - Scoot to Edge of Bed: 4: Min assist Details for Bed Mobility Assistance: cueing for use of rail and sequence to get to EOB with assist for completely elevating trunk and use of pad to scoot to EOB Transfers Transfers: Sit to Stand;Stand to Sit Sit to Stand: 4: Min assist;From bed Stand to Sit: 4: Min assist;To chair/3-in-1;With armrests Details for Transfer Assistance: cueing for hand placement, safety and sequence Ambulation/Gait Ambulation/Gait Assistance: 4: Min assist Ambulation Distance (Feet): 70 Feet Assistive device:  Rolling walker Ambulation/Gait Assistance Details: Pt able to initiate gait and advance RW with assist for turning with change of direction with cueing to recognize fatigue and need to turn around. Cueing for position in RW Gait Pattern: Step-through pattern;Decreased stride length;Trunk flexed;Narrow base of support Gait velocity: decreased Stairs: No    Shoulder Instructions     Exercises     PT Diagnosis: Difficulty walking;Generalized weakness  PT Problem List: Decreased activity tolerance;Decreased mobility;Decreased knowledge of use of DME;Decreased strength PT Treatment Interventions: Gait training;DME instruction;Functional mobility training;Therapeutic activities;Therapeutic exercise;Patient/family education   PT Goals Acute Rehab PT Goals PT Goal Formulation: With patient/family Time For Goal Achievement: 02/24/12 Potential to Achieve Goals: Fair Pt will go Supine/Side to Sit: with supervision;with HOB 0 degrees PT Goal: Supine/Side to Sit - Progress: Goal set today Pt will go Sit to Supine/Side: with supervision;with HOB 0 degrees PT Goal: Sit to Supine/Side - Progress: Goal set today Pt will go Sit to Stand: with supervision PT Goal: Sit to Stand - Progress: Goal set today Pt will go Stand to Sit: with supervision PT Goal: Stand to Sit - Progress: Goal set today Pt will Ambulate: with supervision;51 - 150 feet;with rolling walker PT Goal: Ambulate - Progress: Goal set today  Visit Information  Last PT Received On: 02/10/12 Assistance Needed: +1    Subjective Data  Subjective: I usually do better than this Patient Stated Goal: be strong enough to go home   Prior Functioning  Home Living Lives With: Spouse Available Help at Discharge: Family;Available PRN/intermittently Type of Home: Apartment Home Access: Level entry Home Layout: One level Bathroom Shower/Tub: Tub/shower unit;Curtain  Bathroom Toilet: Standard Home Adaptive Equipment: Shower chair with  back;Grab bars in shower;Walker - rolling;Straight cane;Wheelchair - manual Prior Function Level of Independence: Needs assistance Needs Assistance: Light Housekeeping;Meal Prep Meal Prep: Maximal Light Housekeeping: Maximal Able to Take Stairs?: No Driving: No Vocation: Retired Comments: Pt ambulates with RW at least 150' and uses WC for long distances. During his recent cruise he rented a Radio producer: HOH    Cognition  Overall Cognitive Status: History of cognitive impairments - at baseline Area of Impairment: Memory Arousal/Alertness: Awake/alert Orientation Level: Disoriented to;Place;Time Behavior During Session: WFL for tasks performed    Extremity/Trunk Assessment Right Lower Extremity Assessment RLE ROM/Strength/Tone: Deficits RLE ROM/Strength/Tone Deficits: grossly 3/5 did not formally assess RLE Coordination: WFL - gross/fine motor Left Lower Extremity Assessment LLE ROM/Strength/Tone: Deficits LLE ROM/Strength/Tone Deficits: grossly 3/5 did not formally assess LLE Coordination: WFL - gross/fine motor Trunk Assessment Trunk Assessment: Kyphotic   Balance Static Sitting Balance Static Sitting - Balance Support: Feet supported;Bilateral upper extremity supported Static Sitting - Level of Assistance: 5: Stand by assistance Static Sitting - Comment/# of Minutes: 3  End of Session PT - End of Session Equipment Utilized During Treatment: Gait belt Activity Tolerance: Patient tolerated treatment well Patient left: in chair;with call bell/phone within reach;with family/visitor present Nurse Communication: Mobility status  GP Functional Assessment Tool Used: clinical judgement Functional Limitation: Mobility: Walking and moving around Mobility: Walking and Moving Around Current Status [phone]): At least 20 percent but less than 40 percent impaired, limited or restricted Mobility: Walking and Moving Around Goal Status [phone]): At least 1 percent but  less than 20 percent impaired, limited or restricted   Delorse Lek 02/10/2012, 12:45 PM  Delaney Meigs, PT [phone]

## 2012-02-10 NOTE — Progress Notes (Addendum)
Clinical Social Work Department CLINICAL SOCIAL WORK PLACEMENT NOTE 02/10/2012  Patient:  [name], [name]  Account Number:  [id number] Admit date:  02/09/2012  Clinical Social Worker:  Jacelyn Grip  Date/time:  02/10/2012 01:41 PM  Clinical Social Work is seeking post-discharge placement for this patient at the following level of care:   SKILLED NURSING   (*CSW will update this form in Epic as items are completed)   02/10/2012  Patient/family provided with [facility] Department of Clinical Social Work's list of facilities offering this level of care within the geographic area requested by the patient (or if unable, by the patient's family).  02/10/2012  Patient/family informed of their freedom to choose among providers that offer the needed level of care, that participate in Medicare, Medicaid or managed care program needed by the patient, have an available bed and are willing to accept the patient.  02/10/2012  Patient/family informed of [facility]' ownership interest in [facility], as well as of the fact that they are under no obligation to receive care at this facility.  PASARR submitted to EDS on 02/10/2012 PASARR number received from EDS on 02/10/2012  FL2 transmitted to all facilities in geographic area requested by pt/family on  02/10/2012 FL2 transmitted to all facilities within larger geographic area on   Patient informed that his/her managed care company has contracts with or will negotiate with  certain facilities, including the following:     Patient/family informed of bed offers received:  02/11/2012 Patient chooses bed at [facility] Physician recommends and patient chooses bed at    Patient to be transferred to [facility] on  02/13/12 Patient to be transferred to facility by [facility]  The following physician request were entered in Epic:   Additional Comments:   Jacklynn Lewis, MSW, LCSWA  Clinical Social Work [phone]

## 2012-02-10 NOTE — Progress Notes (Signed)
INITIAL ADULT NUTRITION ASSESSMENT Date: 02/10/2012   Time: 11:08 AM Reason for Assessment: MST (Malnutrition Screening Tool)   INTERVENTION: 1. Ensure Complete po TID, each supplement provides 350 kcal and 13 grams of protein. 2. RD will continue to follow    DOCUMENTATION CODES Per approved criteria  -Severe malnutrition in the context of acute illness or injury -Underweight   ASSESSMENT: Male 78 y.o.  Dx: N/V/D  Hx:  Past Medical History  Diagnosis Date  . COPD (chronic obstructive pulmonary disease)   . GERD (gastroesophageal reflux disease)   . Prostate cancer     "had prostate removed and had chemo" (02/09/2012)  . Depression   . DVT (deep venous thrombosis)   . Alzheimer's disease   . High cholesterol   . Asthma   . Hypertension   . Pneumonia     "4-5 times thru my life" (02/09/2012)  . Chronic bronchitis     "get it q year" (02/09/2012)  . Exertional dyspnea     "sometimes" (02/09/2012)  . History of blood transfusion     "when prostate removed" (02/09/2012)  . Arthritis     "in my fingers"  (02/09/2012)    Related Meds:     . albuterol  2.5 mg Nebulization Q6H  . heparin  5,000 Units Subcutaneous Q8H  . ipratropium  0.5 mg Nebulization Q6H  . levofloxacin (LEVAQUIN) IV  750 mg Intravenous Q48H  . sodium chloride  3 mL Intravenous Q12H  . DISCONTD: azithromycin  500 mg Intravenous Q24H  . DISCONTD: cefTRIAXone (ROCEPHIN)  IV  1 g Intravenous Q24H  . DISCONTD: levofloxacin (LEVAQUIN) IV  750 mg Intravenous Q24H     Ht: 5\' 8"  (172.7 cm)  Wt: 120 lb 5.9 oz (54.6 kg)  Ideal Wt: 70 kg % Ideal Wt: 78%  Usual Wt: 135 lbs per pt  Wt Readings from Last 3 Encounters:  02/09/12 120 lb 5.9 oz (54.6 kg)    % Usual Wt: 88%  Body mass index is 18.30 kg/(m^2). Pt is underweight per current BMI   Food/Nutrition Related Hx: no appetite and weight loss x 1 week PTA  Labs:  CMP     Component Value Date/Time   NA 138 02/09/2012 1750   K 4.5 02/09/2012 1750     CL 98 02/09/2012 1750   CO2 23 02/09/2012 1750   GLUCOSE 105* 02/09/2012 1750   BUN 39* 02/09/2012 1750   CREATININE 1.42* 02/09/2012 1750   CALCIUM 9.9 02/09/2012 1750   PROT 7.8 02/09/2012 1750   ALBUMIN 3.6 02/09/2012 1750   AST 16 02/09/2012 1750   ALT 9 02/09/2012 1750   ALKPHOS 226* 02/09/2012 1750   BILITOT 1.1 02/09/2012 1750   GFRNONAA 43* 02/09/2012 1750   GFRAA 50* 02/09/2012 1750      Intake/Output Summary (Last 24 hours) at 02/10/12 1112 Last data filed at 02/10/12 0900  Gross per 24 hour  Intake   1237 ml  Output      0 ml  Net   1237 ml     Diet Order: Full Liquid  Supplements/Tube Feeding: none   IVF:    sodium chloride Last Rate: 75 mL/hr at 02/10/12 0600    Estimated Nutritional Needs:   Kcal: 1400-1600 Protein:  55-65 gm  Fluid: 1.5-1.6 L   Pt admitted after cruise, ongoing N/V/D. Wife states pt has not eaten anything since Monday, possibly 4 oz of fluids but no solids. Per UBW, pt has lost 11% body weight in 1  week, severe weight loss. Likely some weight loss r/t dehydration if truly no intake of fluids for several days.   Pt meets criteria for severe malnutrition in the context of acute illness 2/2 weight loss of 11% in one week and meeting less then 50% nutrition needs for greater or equal to 5 days.   Pt is agreeable to Ensure TID, chocolate flavor.   NUTRITION DIAGNOSIS: -Malnutrition (NI-5.2).  Status: Ongoing  RELATED TO: poor appetite, N/V/D  AS EVIDENCE BY: weight loss of 11% in one week, less then 50% intake for >/=5 days  MONITORING/EVALUATION(Goals): Goal: Meet 90-100% estimated nutrition needs, currently unmet  Monitor: PO intake, weight, labs, I/O's  EDUCATION NEEDS: -No education needs identified at this time    [name] RD, LDN Pager [phone] After Hours pager [phone]  02/10/2012, 11:08 AM

## 2012-02-10 NOTE — Clinical Social Work Psychosocial (Signed)
     Clinical Social Work Department BRIEF PSYCHOSOCIAL ASSESSMENT 02/10/2012  Patient:  [name], [name]     Account Number:  [id number]     Admit date:  02/09/2012  Clinical Social Worker:  Jacelyn Grip  Date/Time:  02/10/2012 12:30 PM  Referred by:  Physician  Date Referred:  02/10/2012 Referred for  SNF Placement   Other Referral:   Interview type:  Patient Other interview type:   and patient wife at bedside    PSYCHOSOCIAL DATA Living Status:  WIFE Admitted from facility:   Level of care:   Primary support name:  Shirlee Limerick Schnoor/spouse/[phone] Primary support relationship to patient:  SPOUSE Degree of support available:   strong    CURRENT CONCERNS Current Concerns  Post-Acute Placement   Other Concerns:    SOCIAL WORK ASSESSMENT / PLAN CSW received notification from physical therapist stating that pt needing short term rehab at discharge. CSW met with pt and pt spouse at bedside. CSW discussed recommendation for rehab at [facility]. Pt and pt spouse agreeable for initiation of SNF search in [facility]. Pt stated that he has previously completed rehab at [facility] and would be interested in returning. CSW completed FL2 and initiated SNF search to [facility]. CSW submitted pt clinicals to [facility]. CSW to follow up with pt in regard to bed offers. CSW to facilitate pt discharge needs when pt medically ready for discharge and when insurance authorization received.   Assessment/plan status:  Psychosocial Support/Ongoing Assessment of Needs Other assessment/ plan:   Information/referral to community resources:   [facility] list    PATIENTS/FAMILYS RESPONSE TO PLAN OF CARE: Pt alert and oriented x 4. Pt wife at bedside and supportive and actively involved in pt care. Pt and pt wife hopeful for [facility] for SNF placement.

## 2012-02-10 NOTE — Progress Notes (Signed)
[facility] PROGRESS NOTE  [name]:[id number] DOB: Aug 20, 1922 DOA: 02/09/2012 PCP: Lenora Boys, MD  Assessment/Plan: Active Problems:  Nausea & vomiting  Diarrhea  Hypoxia  Cough  Urinary incontinence  HTN (hypertension)  COPD (chronic obstructive pulmonary disease)  Dementia  Hypoxia secondary to atypical pneumonia.  Patient with a history of COPD.  Not on oxygen at home.  Using oxygen in house to maintain sats of 88 - 92%.  Have requested ambulating oxygen sats on room air.  Pneumonia.  On IV levaquin,  Will check urine for legionella, strep pneumo.  Considered Zosyn or Azith but patient has a PCN and erythromycin allergies.    COPD.  With Albuterol and Symbicort at home.  Now on scheduled nebulizers (albuterol and atrovent).  Nausea, Vomiting, Diarrhea.  Apparently resolved.  Patient had one episode of vomiting on Tuesday.  He had diarrhea prior to admission, but has not had a stool since admission.  C-diff PCR and stool culture are pending collection.  Patient is now on regular diet.  Ensure TID.  Wife encouraging him to eat and drink. I have decreased IVF to 50 ml/hour  Acute Renal Failure.  Likely pre-renal and secondary to dehydration from vomiting and diarrhea.  Receiving gentle hydration will check bmet in am.  Skin Condition (Ezcema?).  Have ordered triamcinolone cream.  Low dose.  Code Status:  Family Communication: Wife at bedside Disposition Plan: PT/OT requested.  Likely home when medically ready.   Antibiotics:  Levaquin  HPI/Subjective: [name] is a 78 y.o. male who has just returned from a cruise to the [place]. He has a history of COPD, not on home oxygen, depression, dementia, prostate cancer, GERD. He came to the PCP's office with: couple of days of nausea, vomiting x1 and intermittent diarrhea x at least 1 today, decreased appetite and lethargy for a few days. This started a few days after returning from the  cruise. No known sick contacts.    Objective: Filed Vitals:   02/09/12 1752 02/09/12 2214 02/10/12 0604 02/10/12 1228  BP: 126/69 120/60 124/66   Pulse: 75 77 78   Temp: 97.4 F (36.3 C) 99.4 F (37.4 C) 98.1 F (36.7 C)   TempSrc: Oral Oral Oral   Resp: 20 20 20    Height: 5\' 8"  (1.727 m)     Weight: 54.6 kg (120 lb 5.9 oz)     SpO2: 90% 97% 93% 88%    Intake/Output Summary (Last 24 hours) at 02/10/12 1401 Last data filed at 02/10/12 0900  Gross per 24 hour  Intake   1237 ml  Output      0 ml  Net   1237 ml    Exam:   General:  A&O, NAD, appears weak and frail.  Wife at bedside  Cardiovascular: RRR, No M/R/G, no Lower Extremity Edema  Respiratory: mild increased work of breathing. Lungs sound are coarse anteriorly.  Crackles heard posteriorly.  Abdomen: Thin, Soft, NT, ND, positive bowel sounds, no obvious masses  Neurological:  Cranial Nerves 2 - 12 are grossly intact, exam non focal  Psychiatric: A&O, Cooperative.  Skin: Generally dry and scaly on all extremities and face. Small areas of pink irritation.  No bruises.   Data Reviewed: Basic Metabolic Panel:  Lab 02/09/12 8119  NA 138  K 4.5  CL 98  CO2 23  GLUCOSE 105*  BUN 39*  CREATININE 1.42*  CALCIUM 9.9  MG --  PHOS --  Liver Function Tests:  Lab 02/09/12 1750  AST 16  ALT 9  ALKPHOS 226*  BILITOT 1.1  PROT 7.8  ALBUMIN 3.6   CBC:  Lab 02/09/12 1750  WBC 23.0*  NEUTROABS --  HGB 13.8  HCT 41.1  MCV 87.3  PLT 182     Studies: Portable Chest 1 View  02/09/2012  *RADIOLOGY REPORT*  Clinical Data: 78 year old male with cough.  Hypertension.  PORTABLE CHEST - 1 VIEW  Comparison: 01/03/2011 and earlier.  Findings: Portable upright AP view 1824 hours.  Stable lung volumes.  Increased reticulonodular opacity at both lung bases. Stable cardiac size and mediastinal contours.  The upper lobes appear stable and clear.  No pneumothorax.  Pulmonary vascularity appears stable and within  normal limits.  No effusion.  IMPRESSION: Increased reticulonodular density at both lung bases suspicious for acute atypical infection.   Original Report Authenticated By: Harley Hallmark, M.D.     Scheduled Meds:   . albuterol  2.5 mg Nebulization Q6H  . feeding supplement  237 mL Oral TID BM  . heparin  5,000 Units Subcutaneous Q8H  . ipratropium  0.5 mg Nebulization Q6H  . levofloxacin (LEVAQUIN) IV  750 mg Intravenous Q48H  . sodium chloride  3 mL Intravenous Q12H  . triamcinolone   Topical BID  . DISCONTD: azithromycin  500 mg Intravenous Q24H  . DISCONTD: cefTRIAXone (ROCEPHIN)  IV  1 g Intravenous Q24H  . DISCONTD: levofloxacin (LEVAQUIN) IV  750 mg Intravenous Q24H   Continuous Infusions:   . sodium chloride 75 mL/hr at 02/10/12 0600     Stephani Police [facility] Pager [phone]  If 7PM-7AM, please contact night-coverage [email or web address] Password [email or web address] 02/10/2012, 2:01 PM   LOS: 1 day

## 2012-02-11 DIAGNOSIS — J4489 Other specified chronic obstructive pulmonary disease: Secondary | ICD-10-CM

## 2012-02-11 DIAGNOSIS — R05 Cough: Secondary | ICD-10-CM

## 2012-02-11 DIAGNOSIS — F0390 Unspecified dementia without behavioral disturbance: Secondary | ICD-10-CM

## 2012-02-11 DIAGNOSIS — R112 Nausea with vomiting, unspecified: Secondary | ICD-10-CM

## 2012-02-11 DIAGNOSIS — R059 Cough, unspecified: Secondary | ICD-10-CM

## 2012-02-11 DIAGNOSIS — J449 Chronic obstructive pulmonary disease, unspecified: Secondary | ICD-10-CM

## 2012-02-11 LAB — CBC
MCH: 28.9 pg (ref 26.0–34.0)
MCV: 86.1 fL (ref 78.0–100.0)
Platelets: 137 10*3/uL — ABNORMAL LOW (ref 150–400)
RBC: 3.8 MIL/uL — ABNORMAL LOW (ref 4.22–5.81)
RDW: 14.3 % (ref 11.5–15.5)

## 2012-02-11 LAB — BASIC METABOLIC PANEL
Calcium: 8.9 mg/dL (ref 8.4–10.5)
Creatinine, Ser: 1.17 mg/dL (ref 0.50–1.35)
GFR calc non Af Amer: 54 mL/min — ABNORMAL LOW (ref >90)
Glucose, Bld: 131 mg/dL — ABNORMAL HIGH (ref 70–99)
Sodium: 136 mEq/L (ref 135–145)

## 2012-02-11 LAB — URINE CULTURE
Colony Count: NO GROWTH
Culture: NO GROWTH

## 2012-02-11 LAB — STREP PNEUMONIAE URINARY ANTIGEN: Strep Pneumo Urinary Antigen: NEGATIVE

## 2012-02-11 MED ORDER — GUAIFENESIN ER 600 MG PO TB12
1200.0000 mg | ORAL_TABLET | Freq: Two times a day (BID) | ORAL | Status: DC
  Administered 2012-02-11 – 2012-02-13 (×5): 1200 mg via ORAL
  Filled 2012-02-11 (×6): qty 2

## 2012-02-11 MED ORDER — POTASSIUM CHLORIDE CRYS ER 20 MEQ PO TBCR
40.0000 meq | EXTENDED_RELEASE_TABLET | Freq: Once | ORAL | Status: AC
  Administered 2012-02-11: 40 meq via ORAL
  Filled 2012-02-11: qty 2

## 2012-02-11 NOTE — Progress Notes (Signed)
[facility] PROGRESS NOTE  KEAWE FELLNER JYN:[id number] DOB: Jul 12, 1922 DOA: 02/09/2012 PCP: Lenora Boys, MD  Assessment/Plan: Principal Problem:  *Pneumonia Active Problems:  Nausea & vomiting  Diarrhea  Hypoxia  Cough  Urinary incontinence  HTN (hypertension)  COPD (chronic obstructive pulmonary disease)  Dementia  Hypoxia  -Secondary to atypical pneumonia.  Patient with a history of COPD.  Not on oxygen at home.   -Using oxygen in house to maintain sats of 88 - 92%.  Have requested ambulating oxygen sats on room air.  Pneumonia -On IV levaquin,  and negative urine for strep pneumoniae, Legionella pending. -Continue inhaled bronchodilators, oxygen and mucolytics. -Considered Zosyn or Azith but patient has a PCN and erythromycin allergies.    COPD -With Albuterol and Symbicort at home.  Now on scheduled nebulizers (albuterol and atrovent).  Nausea, Vomiting, Diarrhea -Apparently resolved.  Patient had one episode of vomiting on Tuesday.   -He had diarrhea prior to admission, but has not had a stool since admission.   -C-diff PCR and stool culture are pending collection.  Patient is now on regular diet.  Ensure TID.    Acute Renal Failure.   -Prerenal ARF, secondary to dehydration. Still have some prerenal azotemia with BUN of 30. -Continue IV fluids at a slow rate of 50 mL/hr, check BMP in a.m.  Skin Condition  -(Ezcema?).  Have ordered triamcinolone cream.  Low dose.  Code Status:  Family Communication: Wife at bedside Disposition Plan: PT/OT requested.  Likely home when medically ready.   Antibiotics:  Levaquin  HPI/Subjective: BRYSE BILA is a 78 y.o. male who has just returned from a cruise to [place]. He has a history of COPD, not on home oxygen, depression, dementia, prostate cancer, GERD. He came to the PCP's office with: couple of days of nausea, vomiting x1 and intermittent diarrhea x at least 1 today, decreased  appetite and lethargy for a few days. This started a few days after returning from the cruise. No known sick contacts.    Objective: Filed Vitals:   02/10/12 2042 02/10/12 2128 02/11/12 0728 02/11/12 0937  BP:  133/77 118/65   Pulse:  102 73   Temp:  98.5 F (36.9 C) 98.9 F (37.2 C)   TempSrc:  Oral Oral   Resp:  20 18   Height:      Weight:      SpO2: 91% 97% 90% 92%    Intake/Output Summary (Last 24 hours) at 02/11/12 1116 Last data filed at 02/11/12 0600  Gross per 24 hour  Intake 1418.75 ml  Output      0 ml  Net 1418.75 ml    Exam:   General:  A&O, NAD, appears weak and frail.  Wife at bedside  Cardiovascular: RRR, No M/R/G, no Lower Extremity Edema  Respiratory: mild increased work of breathing. Lungs sound are coarse anteriorly.  Crackles heard posteriorly.  Abdomen: Thin, Soft, NT, ND, positive bowel sounds, no obvious masses  Neurological:  Cranial Nerves 2 - 12 are grossly intact, exam non focal  Psychiatric: A&O, Cooperative.  Skin: Generally dry and scaly on all extremities and face. Small areas of pink irritation.  No bruises.   Data Reviewed: Basic Metabolic Panel:  Lab 02/11/12 8657 02/09/12 1750  NA 136 138  K 3.8 4.5  CL 103 98  CO2 22 23  GLUCOSE 131* 105*  BUN 30* 39*  CREATININE 1.17 1.42*  CALCIUM 8.9 9.9  MG -- --  PHOS -- --   Liver Function Tests:  Lab 02/09/12 1750  AST 16  ALT 9  ALKPHOS 226*  BILITOT 1.1  PROT 7.8  ALBUMIN 3.6   CBC:  Lab 02/11/12 0630 02/09/12 1750  WBC 13.3* 23.0*  NEUTROABS -- --  HGB 11.0* 13.8  HCT 32.7* 41.1  MCV 86.1 87.3  PLT 137* 182     Studies: Portable Chest 1 View  02/09/2012  *RADIOLOGY REPORT*  Clinical Data: 78 year old male with cough.  Hypertension.  PORTABLE CHEST - 1 VIEW  Comparison: 01/03/2011 and earlier.  Findings: Portable upright AP view 1824 hours.  Stable lung volumes.  Increased reticulonodular opacity at both lung bases. Stable cardiac size and mediastinal  contours.  The upper lobes appear stable and clear.  No pneumothorax.  Pulmonary vascularity appears stable and within normal limits.  No effusion.  IMPRESSION: Increased reticulonodular density at both lung bases suspicious for acute atypical infection.   Original Report Authenticated By: Harley Hallmark, M.D.     Scheduled Meds:    . albuterol  2.5 mg Nebulization Q6H  . feeding supplement  237 mL Oral TID BM  . heparin  5,000 Units Subcutaneous Q8H  . ipratropium  0.5 mg Nebulization Q6H  . levofloxacin (LEVAQUIN) IV  750 mg Intravenous Q48H  . sodium chloride  3 mL Intravenous Q12H  . triamcinolone   Topical BID  . DISCONTD: ipratropium  0.5 mg Nebulization QID   Continuous Infusions:    . sodium chloride 50 mL/hr at 02/10/12 2016     [facility] Pager [phone]  If 7PM-7AM, please contact night-coverage [email or web address] Password [email or web address] 02/11/2012, 11:16 AM   LOS: 2 days

## 2012-02-11 NOTE — Progress Notes (Signed)
Addendum  Patient seen and examined, chart and data base reviewed.  I agree with the above assessment and plan  For full details please see Mrs. Algis Downs PA. Note.  Continue ABx.  Clint Lipps Pager: [phone] 02/10/2012, 02:15 PM

## 2012-02-12 DIAGNOSIS — J189 Pneumonia, unspecified organism: Principal | ICD-10-CM

## 2012-02-12 LAB — BASIC METABOLIC PANEL
BUN: 24 mg/dL — ABNORMAL HIGH (ref 6–23)
CO2: 24 mEq/L (ref 19–32)
Chloride: 105 mEq/L (ref 96–112)
Creatinine, Ser: 1.09 mg/dL (ref 0.50–1.35)
Glucose, Bld: 111 mg/dL — ABNORMAL HIGH (ref 70–99)
Potassium: 4.4 mEq/L (ref 3.5–5.1)

## 2012-02-12 LAB — CBC
HCT: 32 % — ABNORMAL LOW (ref 39.0–52.0)
Hemoglobin: 10.4 g/dL — ABNORMAL LOW (ref 13.0–17.0)
MCH: 28.1 pg (ref 26.0–34.0)
MCHC: 32.5 g/dL (ref 30.0–36.0)
MCV: 86.5 fL (ref 78.0–100.0)
RDW: 14.1 % (ref 11.5–15.5)

## 2012-02-12 LAB — LEGIONELLA ANTIGEN, URINE: Legionella Antigen, Urine: NEGATIVE

## 2012-02-12 MED ORDER — LEVOFLOXACIN IN D5W 750 MG/150ML IV SOLN
750.0000 mg | INTRAVENOUS | Status: DC
  Administered 2012-02-12: 750 mg via INTRAVENOUS
  Filled 2012-02-12 (×2): qty 150

## 2012-02-12 MED ORDER — ALBUTEROL SULFATE (5 MG/ML) 0.5% IN NEBU
2.5000 mg | INHALATION_SOLUTION | Freq: Four times a day (QID) | RESPIRATORY_TRACT | Status: DC
  Administered 2012-02-12 – 2012-02-13 (×3): 2.5 mg via RESPIRATORY_TRACT
  Filled 2012-02-12 (×3): qty 0.5

## 2012-02-12 MED ORDER — IPRATROPIUM BROMIDE 0.02 % IN SOLN
0.5000 mg | Freq: Four times a day (QID) | RESPIRATORY_TRACT | Status: DC
  Administered 2012-02-12 – 2012-02-13 (×3): 0.5 mg via RESPIRATORY_TRACT
  Filled 2012-02-12 (×4): qty 2.5

## 2012-02-12 NOTE — Progress Notes (Signed)
[facility] PROGRESS NOTE  [name] EAV:[id number] DOB: [birth year] DOA: 02/09/2012 PCP: Lenora Boys, MD  Assessment/Plan: Principal Problem:  *Pneumonia Active Problems:  Nausea & vomiting  Diarrhea  Hypoxia  Cough  Urinary incontinence  HTN (hypertension)  COPD (chronic obstructive pulmonary disease)  Dementia  Hypoxia  -Secondary to atypical pneumonia.  Patient with a history of COPD.  Not on oxygen at home.   -Using oxygen in house to maintain sats of 88 - 92%.  Have requested ambulating oxygen sats on room air.  Pneumonia -On IV levaquin,  and negative urine for strep pneumoniae, Legionella pending. -Continue inhaled bronchodilators, oxygen and mucolytics. -Considered Zosyn or Azith but patient has a PCN and erythromycin allergies.    COPD -With Albuterol and Symbicort at home.  Now on scheduled nebulizers (albuterol and atrovent).  Nausea, Vomiting, Diarrhea -Apparently resolved.  Patient had one episode of vomiting on Tuesday.   -He had diarrhea prior to admission, but has not had a stool since admission.   -C-diff PCR is negative.  Patient is now on regular diet.  Ensure TID.    Acute Renal Failure.   -Prerenal ARF, secondary to dehydration. This resolved with hydration. -Discontinue IV fluids, check BMP in a.m.  Skin Condition  -(Ezcema?).  Have ordered triamcinolone cream.  Low dose.  Code Status:  Family Communication: Wife at bedside Disposition Plan: PT/OT requested.  Likely home when medically ready.   Antibiotics:  Levaquin  HPI/Subjective: [name] is a 78 y.o. male who has just returned from a cruise to [place]. He has a history of COPD, not on home oxygen, depression, dementia, prostate cancer, GERD. He came to the PCP's office with: couple of days of nausea, vomiting x1 and intermittent diarrhea x at least 1 today, decreased appetite and lethargy for a few days. This started a few days after  returning from the cruise. No known sick contacts.    Objective: Filed Vitals:   02/11/12 1547 02/11/12 2241 02/12/12 0614 02/12/12 0754  BP:  116/62 125/65   Pulse:  84 69   Temp:  99.2 F (37.3 C) 99 F (37.2 C)   TempSrc:  Oral Oral   Resp:  16 18   Height:      Weight:      SpO2: 97% 94% 92% 94%    Intake/Output Summary (Last 24 hours) at 02/12/12 0925 Last data filed at 02/12/12 0615  Gross per 24 hour  Intake 1452.5 ml  Output    425 ml  Net 1027.5 ml    Exam:   General:  A&O, NAD, appears weak and frail.  Wife at bedside  Cardiovascular: RRR, No M/R/G, no Lower Extremity Edema  Respiratory: mild increased work of breathing. Lungs sound are coarse anteriorly.  Crackles heard posteriorly.  Abdomen: Thin, Soft, NT, ND, positive bowel sounds, no obvious masses  Neurological:  Cranial Nerves 2 - 12 are grossly intact, exam non focal  Psychiatric: A&O, Cooperative.  Skin: Generally dry and scaly on all extremities and face. Small areas of pink irritation.  No bruises.   Data Reviewed: Basic Metabolic Panel:  Lab 02/12/12 7829 02/11/12 0630 02/09/12 1750  NA 137 136 138  K 4.4 3.8 4.5  CL 105 103 98  CO2 24 22 23   GLUCOSE 111* 131* 105*  BUN 24* 30* 39*  CREATININE 1.09 1.17 1.42*  CALCIUM 8.9 8.9 9.9  MG -- -- --  PHOS -- -- --  Liver Function Tests:  Lab 02/09/12 1750  AST 16  ALT 9  ALKPHOS 226*  BILITOT 1.1  PROT 7.8  ALBUMIN 3.6   CBC:  Lab 02/12/12 0518 02/11/12 0630 02/09/12 1750  WBC 10.4 13.3* 23.0*  NEUTROABS -- -- --  HGB 10.4* 11.0* 13.8  HCT 32.0* 32.7* 41.1  MCV 86.5 86.1 87.3  PLT 140* 137* 182     Studies: Portable Chest 1 View  02/09/2012  *RADIOLOGY REPORT*  Clinical Data: 78 year old male with cough.  Hypertension.  PORTABLE CHEST - 1 VIEW  Comparison: 01/03/2011 and earlier.  Findings: Portable upright AP view 1824 hours.  Stable lung volumes.  Increased reticulonodular opacity at both lung bases. Stable cardiac  size and mediastinal contours.  The upper lobes appear stable and clear.  No pneumothorax.  Pulmonary vascularity appears stable and within normal limits.  No effusion.  IMPRESSION: Increased reticulonodular density at both lung bases suspicious for acute atypical infection.   Original Report Authenticated By: Harley Hallmark, M.D.     Scheduled Meds:    . albuterol  2.5 mg Nebulization Q6H  . feeding supplement  237 mL Oral TID BM  . guaiFENesin  1,200 mg Oral BID  . heparin  5,000 Units Subcutaneous Q8H  . ipratropium  0.5 mg Nebulization Q6H  . levofloxacin (LEVAQUIN) IV  750 mg Intravenous Q48H  . potassium chloride  40 mEq Oral Once  . sodium chloride  3 mL Intravenous Q12H  . triamcinolone   Topical BID   Continuous Infusions:    . sodium chloride 50 mL/hr at 02/10/12 2016     [facility] Pager [phone]  If 7PM-7AM, please contact night-coverage [email or web address] Password [email or web address] 02/12/2012, 9:25 AM   LOS: 3 days

## 2012-02-12 NOTE — Progress Notes (Signed)
SATURATION QUALIFICATIONS:  Patient Saturations on Room Air at Rest = 92%  Patient Saturations on Room Air while Ambulating = 85%

## 2012-02-13 DIAGNOSIS — R32 Unspecified urinary incontinence: Secondary | ICD-10-CM

## 2012-02-13 LAB — CBC
Hemoglobin: 11.3 g/dL — ABNORMAL LOW (ref 13.0–17.0)
MCH: 29.1 pg (ref 26.0–34.0)
MCHC: 33.8 g/dL (ref 30.0–36.0)
RDW: 14 % (ref 11.5–15.5)

## 2012-02-13 LAB — BASIC METABOLIC PANEL
BUN: 17 mg/dL (ref 6–23)
Creatinine, Ser: 1.07 mg/dL (ref 0.50–1.35)
GFR calc Af Amer: 70 mL/min — ABNORMAL LOW (ref >90)
GFR calc non Af Amer: 60 mL/min — ABNORMAL LOW (ref >90)
Glucose, Bld: 120 mg/dL — ABNORMAL HIGH (ref 70–99)
Potassium: 4.5 mEq/L (ref 3.5–5.1)

## 2012-02-13 MED ORDER — ALBUTEROL SULFATE (5 MG/ML) 0.5% IN NEBU: 2.5000 mg | INHALATION_SOLUTION | RESPIRATORY_TRACT | Status: DC | PRN

## 2012-02-13 MED ORDER — TRIAMCINOLONE ACETONIDE 0.025 % EX CREA: TOPICAL_CREAM | Freq: Two times a day (BID) | CUTANEOUS | Status: DC

## 2012-02-13 MED ORDER — ENSURE COMPLETE PO LIQD: 237.0000 mL | Freq: Three times a day (TID) | ORAL | Status: DC

## 2012-02-13 MED ORDER — POLYVINYL ALCOHOL 1.4 % OP SOLN: 1.0000 [drp] | OPHTHALMIC | Status: DC | PRN

## 2012-02-13 MED ORDER — LEVOFLOXACIN 750 MG PO TABS: 750.0000 mg | ORAL_TABLET | ORAL | Status: AC

## 2012-02-13 NOTE — Progress Notes (Signed)
Clinical Social Work  CSW faxed [employer] summary to [facility]. SNF agreeable to admission today. CSW received [employer] authorization. SNF reports they are ready for admission. CSW informed patient, wife and RN of dc and all were agreeable. CSW coordinated transportation via [place]. CSW is signing off.  [place], [place] [phone]

## 2012-02-13 NOTE — Progress Notes (Signed)
Patient discharged to SNF in stable condition via ambulance. 

## 2012-02-13 NOTE — Clinical Documentation Improvement (Signed)
MALNUTRITION DOCUMENTATION CLARIFICATION  THIS DOCUMENT IS NOT A PERMANENT PART OF THE MEDICAL RECORD  TO RESPOND TO THE THIS QUERY, FOLLOW THE INSTRUCTIONS BELOW:  1. If needed, update documentation for the patient's encounter via the notes activity.  2. Access this query again and click edit on the In [employer].  3. After updating, or not, click F2 to complete all highlighted (required) fields concerning your review. Select "additional documentation in the medical record" OR "no additional documentation provided".  4. Click Sign note button.  5. The deficiency will fall out of your In Basket *Please let us know if you are not able to complete this workflow by phone or e-mail (listed below).  Please update your documentation within the medical record to reflect your response to this query.                                                                                        02/13/12   Dear Dr. Judie Petit. [facility],  In a better effort to capture your patient's severity of illness, reflect appropriate length of stay and utilization of resources, a review of the patient medical record has revealed the following indicators.    Based on your clinical judgment, please clarify and document in a progress note and/or discharge summary the clinical condition associated with the following supporting information:  In responding to this query please exercise your independent judgment.  The fact that a query is asked, does not imply that any particular answer is desired or expected.  Per nutritional consult patient meeting criteria for " Severe malnutrition in the context of acute illness or injury" with BMI  of 18.30 and with a weight loss of 11% over 1 week's time.  Is this an appropriate secondary diagnosis ?, if not please document patient's nutritional status?Marland Kitchen  Thank you      Possible Clinical Conditions?  Moderate Malnutrition  Severe Malnutrition    Protein Calorie  Malnutrition  Severe Protein Calorie Malnutrition  Cachexia    Other Condition  Cannot clinically determine     Supporting Information: Risk Factors:  Signs & Symptoms: Ht 5'8"/ 172.7 cm    Wt 120 lbs 5.9 oz/ 54.6 kg   BMI:  18.30  Weight  Loss  11% over 1 week   Diagnostics:  Albumin level:  3.6...Marland Kitchen. 10/3  Treatment: rec Ensure Complete po TID, each supplement provides 350 kcal and 13 grams of protein   Nutrition Consult: Severe malnutrition in the context of acute illness or injury     You may use possible, probable, or suspect with inpatient documentation. possible, probable, suspected diagnoses MUST be documented at the time of discharge  Reviewed:doc in d/c summary "severe malnutrition" 10/7  Thank You,  Leonette Most Agron Swiney  Clinical Documentation Specialist RN, BSN: Pager [phone] HIM Off # [phone]   Health Information Management [place]

## 2012-02-13 NOTE — Discharge Summary (Signed)
Physician Discharge Summary  [name]:[id number] DOB: 08-02-1922 DOA: 02/09/2012  PCP: Lenora Boys, MD  Admit date: 02/09/2012 Discharge date: 02/13/2012  Recommendations for Outpatient Follow-up:  Patient with hypoxia from COPD.  Oxygen saturations drop to 85% on room air when ambulating.  He will be discharged on Oxygen n/c 2 liters continuous.  Patient eating very poorly.  Has severe malnutrition.  Has been prescribed ensure shakes TID.   Discharge Diagnoses:  Principal Problem:  *Pneumonia Active Problems:  Nausea & vomiting  Diarrhea  Hypoxia  Cough  Urinary incontinence  HTN (hypertension)  COPD (chronic obstructive pulmonary disease)  Dementia    Discharge Condition: Improved and stable for d/c back to Skilled Nursing Facility.  Diet recommendation:  Regular diet with ensure shakes between meals.  History of present illness:  [name] is a 78 y.o. male who has just returned from a cruise to the [place]. He has a history of COPD, not on home oxygen, depression, dementia, prostate cancer, GERD. He came to the PCP's office with: couple of days of nausea, vomiting x1 and intermittent diarrhea x at least 1 today, decreased appetite and lethargy for a few days. This started a few days after returning from the cruise. No known sick contacts.   Hospital Course:   Hypoxia  -Secondary to atypical pneumonia. Patient with a history of COPD. Not on oxygen at home.  -Using oxygen in house to maintain sats of 88 - 92%. Have requested ambulating oxygen sats on room air.   Pneumonia  Was treated with IV levaquin.  Will change to oral levaquin at discharge and given a ten day course of treatment.  Negative urine for strep pneumoniae and Legionella.  Continue inhaled bronchodilators, oxygen and mucolytics.  Considered Zosyn or Azith but patient has a PCN and erythromycin allergies.   COPD  Significantly improved with antibiotics and scheduled  nebulizers.  He is on  Albuterol and Symbicort at home.   Will d/c with order for PRN albuterol nebulizer. Patient's oxygen saturations drop to 85% on room air when ambulating.  He will be discharged on Oxygen n/c 2 liters continuous.  Nausea, Vomiting, Diarrhea  Apparently resolved. Patient had one episode of vomiting on Tuesday prior to admission.  He had diarrhea prior to admission, but has not had a stool since admission.  C-diff PCR is negative. Patient is now on regular diet. Ensure TID.   Acute Renal Failure.  Prerenal ARF, secondary to dehydration. This resolved with hydration.  Creatinine has improved (1.07 at d/c)  Severe malnutrition  Iin the context of acute illness 2/2 weight loss of 11% in one week and meeting less then 50% nutrition needs for greater or equal to 5 days.   Patient still not wanting to eat.  Providing encouragement and Ensure shakes TID between meals.  Skin Condition  Ezcema?. Have ordered triamcinolone cream. Low dose.   Discharge Exam: Filed Vitals:   02/13/12 0545  BP: 126/51  Pulse: 64  Temp: 98.3 F (36.8 C)  Resp: 18   Filed Vitals:   02/12/12 2226 02/13/12 0545 02/13/12 0745 02/13/12 0905  BP: 133/66 126/51    Pulse: 82 64    Temp: 98.5 F (36.9 C) 98.3 F (36.8 C)    TempSrc: Oral Oral    Resp: 16 18    Height:      Weight:      SpO2: 96% 96% 96% 93%   General: Alert and orientated.  Looking forward to discharge. Cardiovascular: rrr no m/r/g Respiratory: min expiratory wheeze in lower right base.  Good air movement. Extremities:  No lower extremity edema.  Discharge Instructions     Medication List     As of 02/13/2012 10:37 AM    TAKE these medications         albuterol (5 MG/ML) 0.5% nebulizer solution   Commonly known as: PROVENTIL   Take 0.5 mLs (2.5 mg total) by nebulization every 2 (two) hours as needed for wheezing.      albuterol 108 (90 BASE) MCG/ACT inhaler   Commonly known as: PROVENTIL HFA;VENTOLIN HFA    Inhale 2 puffs into the lungs every 6 (six) hours as needed.      amLODipine 5 MG tablet   Commonly known as: NORVASC   Take 5 mg by mouth daily.      aspirin 81 MG chewable tablet   Chew 81 mg by mouth daily.      budesonide-formoterol 80-4.5 MCG/ACT inhaler   Commonly known as: SYMBICORT   Inhale 2 puffs into the lungs 2 (two) times daily.      cholecalciferol 1000 UNITS tablet   Commonly known as: VITAMIN D   Take 1,000 Units by mouth daily.      feeding supplement Liqd   Take 237 mLs by mouth 3 (three) times daily between meals.      galantamine 12 MG tablet   Commonly known as: RAZADYNE   Take 12 mg by mouth 2 (two) times daily.      levofloxacin 750 MG tablet   Commonly known as: LEVAQUIN   Take 1 tablet (750 mg total) by mouth every other day.   Start taking on: 02/14/2012      mirtazapine 15 MG tablet   Commonly known as: REMERON   Take 30 mg by mouth at bedtime.      multivitamin with minerals Tabs   Take 1 tablet by mouth daily.      multivitamin-lutein Caps   Take 1 capsule by mouth daily.      omeprazole 20 MG capsule   Commonly known as: PRILOSEC   Take 20 mg by mouth daily.      oxybutynin 5 MG tablet   Commonly known as: DITROPAN   Take 5 mg by mouth 2 (two) times daily.      polyvinyl alcohol 1.4 % ophthalmic solution   Commonly known as: LIQUIFILM TEARS   Place 1 drop into both eyes as needed.      promethazine 25 MG tablet   Commonly known as: PHENERGAN   Take 25 mg by mouth every 6 (six) hours as needed. For nausea      sertraline 100 MG tablet   Commonly known as: ZOLOFT   Take 100 mg by mouth daily.      triamcinolone 0.025 % cream   Commonly known as: KENALOG   Apply topically 2 (two) times daily.          The results of significant diagnostics from this hospitalization (including imaging, microbiology, ancillary and laboratory) are listed below for reference.    Significant Diagnostic Studies: Portable Chest 1  View  03-04-2012  *RADIOLOGY REPORT*  Clinical Data: 78 year old male with cough.  Hypertension.  PORTABLE CHEST - 1 VIEW  Comparison: 01/03/2011 and earlier.  Findings: Portable upright AP view 1824 hours.  Stable lung volumes.  Increased reticulonodular opacity at both lung bases. Stable cardiac size and mediastinal contours.  The upper lobes appear stable  and clear.  No pneumothorax.  Pulmonary vascularity appears stable and within normal limits.  No effusion.  IMPRESSION: Increased reticulonodular density at both lung bases suspicious for acute atypical infection.   Original Report Authenticated By: Harley Hallmark, M.D.     Microbiology: Recent Results (from the past 240 hour(s))  URINE CULTURE     Status: Normal   Collection Time   02/10/12  6:36 AM      Component Value Range Status Comment   Specimen Description URINE, RANDOM   Final    Special Requests NONE   Final    Culture  Setup Time 02/10/2012 12:18   Final    Colony Count NO GROWTH   Final    Culture NO GROWTH   Final    Report Status 02/11/2012 FINAL   Final   STOOL CULTURE     Status: Normal (Preliminary result)   Collection Time   02/11/12  1:49 PM      Component Value Range Status Comment   Specimen Description STOOL   Final    Special Requests NONE   Final    Culture NO SUSPICIOUS COLONIES, CONTINUING TO HOLD   Final    Report Status PENDING   Incomplete   CLOSTRIDIUM DIFFICILE BY PCR     Status: Normal   Collection Time   02/11/12  1:49 PM      Component Value Range Status Comment   C difficile by pcr NEGATIVE  NEGATIVE Final      Labs: Basic Metabolic Panel:  Lab 02/13/12 1610 02/12/12 0518 02/11/12 0630 02/09/12 1750  NA 135 137 136 138  K 4.5 4.4 3.8 4.5  CL 101 105 103 98  CO2 26 24 22 23   GLUCOSE 120* 111* 131* 105*  BUN 17 24* 30* 39*  CREATININE 1.07 1.09 1.17 1.42*  CALCIUM 9.4 8.9 8.9 9.9  MG -- -- -- --  PHOS -- -- -- --   Liver Function Tests:  Lab 02/09/12 1750  AST 16  ALT 9  ALKPHOS  226*  BILITOT 1.1  PROT 7.8  ALBUMIN 3.6   No results found for this basename: LIPASE:5,AMYLASE:5 in the last 168 hours No results found for this basename: AMMONIA:5 in the last 168 hours CBC:  Lab 02/13/12 0615 02/12/12 0518 02/11/12 0630 02/09/12 1750  WBC 10.8* 10.4 13.3* 23.0*  NEUTROABS -- -- -- --  HGB 11.3* 10.4* 11.0* 13.8  HCT 33.4* 32.0* 32.7* 41.1  MCV 86.1 86.5 86.1 87.3  PLT 172 140* 137* 182   Cardiac Enzymes: No results found for this basename: CKTOTAL:5,CKMB:5,CKMBINDEX:5,TROPONINI:5 in the last 168 hours BNP: BNP (last 3 results) No results found for this basename: PROBNP:3 in the last 8760 hours CBG: No results found for this basename: GLUCAP:5 in the last 168 hours  Time coordinating discharge: 40 min.  Signed:  Algis Downs, PA-C [facility] Pager: [phone]  [facility] 02/13/2012, 10:37 AM

## 2012-02-13 NOTE — Progress Notes (Signed)
Physical Therapy Treatment Patient Details Name: [name] MRN: [id number] DOB: 1922-09-23 Today's Date: 02/13/2012 Time: 1308-6578 PT Time Calculation (min): 28 min  PT Assessment / Plan / Recommendation Comments on Treatment Session  Pt admitted with N/V, PNA and progressing with mobility and function however still stating he doesn't want to eat. Pt with sats 93% on RA and drop to 85% on RA with ambulation. Pt encouraged to continue to increase mobility.     Follow Up Recommendations        Does the patient have the potential to tolerate intense rehabilitation     Barriers to Discharge        Equipment Recommendations       Recommendations for Other Services    Frequency     Plan Discharge plan remains appropriate;Frequency remains appropriate    Precautions / Restrictions Precautions Precautions: Fall Precaution Comments: oxygen   Pertinent Vitals/Pain No pain sats 85% on RA    Mobility  Bed Mobility Bed Mobility: Rolling Right;Right Sidelying to Sit Rolling Right: 5: Supervision Right Sidelying to Sit: 5: Supervision;With rails;HOB flat Sitting - Scoot to Edge of Bed: 5: Supervision Details for Bed Mobility Assistance: cueing for hand placement and sequence Transfers Transfers: Stand Pivot Transfers Sit to Stand: 4: Min assist;From bed;From chair/3-in-1 Stand to Sit: 4: Min guard;To chair/3-in-1 Stand Pivot Transfers: 4: Min guard Details for Transfer Assistance: cueing for hand placement and sequence, RW for pivot bed to recliner and assist for anterior translation Ambulation/Gait Ambulation/Gait Assistance: 4: Min assist Ambulation Distance (Feet): 100 Feet Assistive device: Rolling walker Ambulation/Gait Assistance Details: cueing for position in RW and posture with cueing to recognize fatigue and need to turn around Gait Pattern: Step-through pattern;Decreased stride length;Trunk flexed;Narrow base of support Gait velocity: decreased    Exercises  General Exercises - Lower Extremity Long Arc Quad: AROM;Both;10 reps;Seated Hip Flexion/Marching: AROM;Both;10 reps;Seated   PT Diagnosis:    PT Problem List:   PT Treatment Interventions:     PT Goals Acute Rehab PT Goals Pt will go Supine/Side to Sit: with modified independence;with HOB 0 degrees PT Goal: Supine/Side to Sit - Progress: Updated due to goal met Pt will go Sit to Supine/Side: with modified independence;with HOB 0 degrees PT Goal: Sit to Supine/Side - Progress: Updated due to goal met PT Goal: Sit to Stand - Progress: Progressing toward goal PT Goal: Stand to Sit - Progress: Progressing toward goal PT Goal: Ambulate - Progress: Progressing toward goal  Visit Information  Last PT Received On: 02/13/12 Assistance Needed: +1    Subjective Data  Subjective: I'm afraid if I eat I will move my bowels   Cognition  Overall Cognitive Status: History of cognitive impairments - at baseline Area of Impairment: Memory Arousal/Alertness: Awake/alert Orientation Level: Disoriented to;Time Behavior During Session: WFL for tasks performed    Balance     End of Session PT - End of Session Equipment Utilized During Treatment: Gait belt;Oxygen Activity Tolerance: Patient tolerated treatment well Patient left: in chair;with call bell/phone within reach Nurse Communication: Mobility status   GP     Toney Sang [facility] 02/13/2012, 9:11 AM Delaney Meigs, PT [phone]

## 2012-02-13 NOTE — Discharge Summary (Signed)
Addendum  Patient seen and examined, chart and data base reviewed.  I agree with the above assessment and discharge plan  For full details please see Mrs. Algis Downs PA. Note.  PNA, Hypoxia he needs O2 on discharge.  Clint Lipps Pager: [phone] 02/13/2012, 11:43 AM

## 2012-02-15 LAB — STOOL CULTURE

## 2012-02-20 NOTE — Care Management Note (Signed)
    Page 1 of 1   02/20/2012     6:43:24 PM   CARE MANAGEMENT NOTE 02/20/2012  Patient:  [name], [name]   Account Number:  [id number]  Date Initiated:  02/20/2012  Documentation initiated by:  Letha Cape  Subjective/Objective Assessment:   admit     Action/Plan:   Anticipated DC Date:  02/13/2012   Anticipated DC Plan:  SKILLED NURSING FACILITY  In-house referral  Clinical Social Worker      DC Planning Services  CM consult      Choice offered to / List presented to:             Status of service:  Completed, signed off Medicare Important Message given?   (If response is "NO", the following Medicare IM given date fields will be blank) Date Medicare IM given:   Date Additional Medicare IM given:    Discharge Disposition:  SKILLED NURSING FACILITY  Per UR Regulation:  Reviewed for med. necessity/level of care/duration of stay  If discussed at Long Length of Stay Meetings, dates discussed:    Comments:

## 2013-06-20 ENCOUNTER — Emergency Department (HOSPITAL_COMMUNITY): Payer: Medicare HMO

## 2013-06-20 ENCOUNTER — Inpatient Hospital Stay (HOSPITAL_COMMUNITY)
Admission: EM | Admit: 2013-06-20 | Discharge: 2013-06-24 | DRG: 194 | Disposition: A | Discharge status: Skilled Nursing Facility | Payer: Medicare HMO | Attending: Internal Medicine | Admitting: Internal Medicine

## 2013-06-20 ENCOUNTER — Encounter (HOSPITAL_COMMUNITY): Payer: Self-pay | Admitting: Emergency Medicine

## 2013-06-20 DIAGNOSIS — D72829 Elevated white blood cell count, unspecified: Secondary | ICD-10-CM | POA: Diagnosis present

## 2013-06-20 DIAGNOSIS — F0390 Unspecified dementia without behavioral disturbance: Secondary | ICD-10-CM

## 2013-06-20 DIAGNOSIS — F3289 Other specified depressive episodes: Secondary | ICD-10-CM | POA: Diagnosis present

## 2013-06-20 DIAGNOSIS — H109 Unspecified conjunctivitis: Secondary | ICD-10-CM | POA: Diagnosis present

## 2013-06-20 DIAGNOSIS — Z91013 Allergy to seafood: Secondary | ICD-10-CM

## 2013-06-20 DIAGNOSIS — R059 Cough, unspecified: Secondary | ICD-10-CM

## 2013-06-20 DIAGNOSIS — Z66 Do not resuscitate: Secondary | ICD-10-CM | POA: Diagnosis present

## 2013-06-20 DIAGNOSIS — Z91010 Allergy to peanuts: Secondary | ICD-10-CM

## 2013-06-20 DIAGNOSIS — R32 Unspecified urinary incontinence: Secondary | ICD-10-CM

## 2013-06-20 DIAGNOSIS — R1312 Dysphagia, oropharyngeal phase: Secondary | ICD-10-CM | POA: Diagnosis present

## 2013-06-20 DIAGNOSIS — F329 Major depressive disorder, single episode, unspecified: Secondary | ICD-10-CM | POA: Diagnosis present

## 2013-06-20 DIAGNOSIS — J189 Pneumonia, unspecified organism: Principal | ICD-10-CM | POA: Diagnosis present

## 2013-06-20 DIAGNOSIS — J4489 Other specified chronic obstructive pulmonary disease: Secondary | ICD-10-CM | POA: Diagnosis present

## 2013-06-20 DIAGNOSIS — I10 Essential (primary) hypertension: Secondary | ICD-10-CM | POA: Diagnosis present

## 2013-06-20 DIAGNOSIS — R112 Nausea with vomiting, unspecified: Secondary | ICD-10-CM

## 2013-06-20 DIAGNOSIS — N179 Acute kidney failure, unspecified: Secondary | ICD-10-CM

## 2013-06-20 DIAGNOSIS — J449 Chronic obstructive pulmonary disease, unspecified: Secondary | ICD-10-CM

## 2013-06-20 DIAGNOSIS — E7800 Pure hypercholesterolemia, unspecified: Secondary | ICD-10-CM | POA: Diagnosis present

## 2013-06-20 DIAGNOSIS — Z882 Allergy status to sulfonamides status: Secondary | ICD-10-CM

## 2013-06-20 DIAGNOSIS — K219 Gastro-esophageal reflux disease without esophagitis: Secondary | ICD-10-CM | POA: Diagnosis present

## 2013-06-20 DIAGNOSIS — R0902 Hypoxemia: Secondary | ICD-10-CM | POA: Diagnosis present

## 2013-06-20 DIAGNOSIS — F05 Delirium due to known physiological condition: Secondary | ICD-10-CM | POA: Diagnosis present

## 2013-06-20 DIAGNOSIS — Z86718 Personal history of other venous thrombosis and embolism: Secondary | ICD-10-CM

## 2013-06-20 DIAGNOSIS — R197 Diarrhea, unspecified: Secondary | ICD-10-CM | POA: Diagnosis present

## 2013-06-20 DIAGNOSIS — Z91012 Allergy to eggs: Secondary | ICD-10-CM

## 2013-06-20 DIAGNOSIS — E870 Hyperosmolality and hypernatremia: Secondary | ICD-10-CM | POA: Diagnosis present

## 2013-06-20 DIAGNOSIS — R05 Cough: Secondary | ICD-10-CM

## 2013-06-20 DIAGNOSIS — F0280 Dementia in other diseases classified elsewhere without behavioral disturbance: Secondary | ICD-10-CM | POA: Diagnosis present

## 2013-06-20 DIAGNOSIS — G309 Alzheimer's disease, unspecified: Secondary | ICD-10-CM | POA: Diagnosis present

## 2013-06-20 DIAGNOSIS — I447 Left bundle-branch block, unspecified: Secondary | ICD-10-CM | POA: Diagnosis present

## 2013-06-20 DIAGNOSIS — Z87891 Personal history of nicotine dependence: Secondary | ICD-10-CM

## 2013-06-20 DIAGNOSIS — Z880 Allergy status to penicillin: Secondary | ICD-10-CM

## 2013-06-20 DIAGNOSIS — E860 Dehydration: Secondary | ICD-10-CM

## 2013-06-20 DIAGNOSIS — Z91040 Latex allergy status: Secondary | ICD-10-CM

## 2013-06-20 DIAGNOSIS — Z8546 Personal history of malignant neoplasm of prostate: Secondary | ICD-10-CM

## 2013-06-20 LAB — CBC WITH DIFFERENTIAL/PLATELET
Basophils Absolute: 0 10*3/uL (ref 0.0–0.1)
Basophils Relative: 0 % (ref 0–1)
EOS ABS: 0 10*3/uL (ref 0.0–0.7)
Eosinophils Relative: 0 % (ref 0–5)
HCT: 41.2 % (ref 39.0–52.0)
HEMOGLOBIN: 13.9 g/dL (ref 13.0–17.0)
LYMPHS PCT: 10 % — AB (ref 12–46)
Lymphs Abs: 1.9 10*3/uL (ref 0.7–4.0)
MCH: 29.3 pg (ref 26.0–34.0)
MCHC: 33.7 g/dL (ref 30.0–36.0)
MCV: 86.7 fL (ref 78.0–100.0)
Monocytes Absolute: 1.9 10*3/uL — ABNORMAL HIGH (ref 0.1–1.0)
Monocytes Relative: 10 % (ref 3–12)
NEUTROS PCT: 81 % — AB (ref 43–77)
Neutro Abs: 15.7 10*3/uL — ABNORMAL HIGH (ref 1.7–7.7)
PLATELETS: 270 10*3/uL (ref 150–400)
RBC: 4.75 MIL/uL (ref 4.22–5.81)
RDW: 14.7 % (ref 11.5–15.5)
WBC: 19.5 10*3/uL — AB (ref 4.0–10.5)

## 2013-06-20 LAB — CBC
HCT: 39.4 % (ref 39.0–52.0)
Hemoglobin: 13.4 g/dL (ref 13.0–17.0)
MCH: 29.5 pg (ref 26.0–34.0)
MCHC: 34 g/dL (ref 30.0–36.0)
MCV: 86.8 fL (ref 78.0–100.0)
PLATELETS: 235 10*3/uL (ref 150–400)
RBC: 4.54 MIL/uL (ref 4.22–5.81)
RDW: 14.9 % (ref 11.5–15.5)
WBC: 16.5 10*3/uL — ABNORMAL HIGH (ref 4.0–10.5)

## 2013-06-20 LAB — BASIC METABOLIC PANEL
BUN: 41 mg/dL — ABNORMAL HIGH (ref 6–23)
CHLORIDE: 110 meq/L (ref 96–112)
CO2: 20 mEq/L (ref 19–32)
Calcium: 9 mg/dL (ref 8.4–10.5)
Creatinine, Ser: 1.53 mg/dL — ABNORMAL HIGH (ref 0.50–1.35)
GFR calc Af Amer: 45 mL/min — ABNORMAL LOW (ref >90)
GFR, EST NON AFRICAN AMERICAN: 39 mL/min — AB (ref >90)
GLUCOSE: 131 mg/dL — AB (ref 70–99)
Potassium: 4.1 mEq/L (ref 3.7–5.3)
SODIUM: 150 meq/L — AB (ref 137–147)

## 2013-06-20 LAB — TROPONIN I: Troponin I: <0.3 ng/mL (ref <0.30)

## 2013-06-20 LAB — INFLUENZA PANEL BY PCR (TYPE A & B)
H1N1FLUPCR: NOT DETECTED
INFLBPCR: NEGATIVE
Influenza A By PCR: NEGATIVE

## 2013-06-20 LAB — CREATININE, SERUM
CREATININE: 1.43 mg/dL — AB (ref 0.50–1.35)
GFR calc Af Amer: 49 mL/min — ABNORMAL LOW (ref >90)
GFR calc non Af Amer: 42 mL/min — ABNORMAL LOW (ref >90)

## 2013-06-20 LAB — OSMOLALITY: OSMOLALITY: 312 mosm/kg — AB (ref 275–300)

## 2013-06-20 MED ORDER — GALANTAMINE HYDROBROMIDE 4 MG PO TABS: 12.0000 mg | ORAL_TABLET | Freq: Two times a day (BID) | ORAL | Status: DC

## 2013-06-20 MED ORDER — NEOMYCIN-POLYMYXIN-DEXAMETH 3.5-10000-0.1 OP OINT
TOPICAL_OINTMENT | Freq: Once | OPHTHALMIC | Status: AC
  Administered 2013-06-20: 1 via OPHTHALMIC
  Filled 2013-06-20: qty 3.5

## 2013-06-20 MED ORDER — VITAMIN D3 25 MCG (1000 UNIT) PO TABS
1000.0000 [IU] | ORAL_TABLET | Freq: Every day | ORAL | Status: DC
Start: 2013-06-21 — End: 2013-06-24
  Administered 2013-06-21 – 2013-06-24 (×4): 1000 [IU] via ORAL
  Filled 2013-06-20 (×4): qty 1

## 2013-06-20 MED ORDER — TRIAMCINOLONE ACETONIDE 0.025 % EX CREA
TOPICAL_CREAM | Freq: Two times a day (BID) | CUTANEOUS | Status: DC
  Administered 2013-06-20: 22:00:00 via TOPICAL
  Filled 2013-06-20: qty 15

## 2013-06-20 MED ORDER — DEXTROSE 5 % IV SOLN: 500.0000 mg | Freq: Once | INTRAVENOUS | Status: DC

## 2013-06-20 MED ORDER — GALANTAMINE HYDROBROMIDE 4 MG PO TABS
12.0000 mg | ORAL_TABLET | Freq: Two times a day (BID) | ORAL | Status: DC
  Administered 2013-06-20 – 2013-06-24 (×8): 12 mg via ORAL
  Filled 2013-06-20 (×9): qty 3

## 2013-06-20 MED ORDER — MIRTAZAPINE 30 MG PO TABS
30.0000 mg | ORAL_TABLET | Freq: Every day | ORAL | Status: DC
  Administered 2013-06-20 – 2013-06-23 (×4): 30 mg via ORAL
  Filled 2013-06-20 (×5): qty 1

## 2013-06-20 MED ORDER — OCUVITE-LUTEIN PO CAPS
1.0000 | ORAL_CAPSULE | Freq: Every day | ORAL | Status: DC
  Administered 2013-06-21 – 2013-06-24 (×4): 1 via ORAL
  Filled 2013-06-20 (×4): qty 1

## 2013-06-20 MED ORDER — GUAIFENESIN-DM 100-10 MG/5ML PO SYRP
5.0000 mL | ORAL_SOLUTION | ORAL | Status: DC | PRN
  Administered 2013-06-20: 5 mL via ORAL
  Filled 2013-06-20: qty 5

## 2013-06-20 MED ORDER — HYDROCODONE-ACETAMINOPHEN 5-325 MG PO TABS: 1.0000 | ORAL_TABLET | ORAL | Status: DC | PRN

## 2013-06-20 MED ORDER — ALUM & MAG HYDROXIDE-SIMETH 200-200-20 MG/5ML PO SUSP: 30.0000 mL | Freq: Four times a day (QID) | ORAL | Status: DC | PRN

## 2013-06-20 MED ORDER — POLYVINYL ALCOHOL 1.4 % OP SOLN: 1.0000 [drp] | OPHTHALMIC | Status: DC | PRN

## 2013-06-20 MED ORDER — PANTOPRAZOLE SODIUM 40 MG PO TBEC
40.0000 mg | DELAYED_RELEASE_TABLET | Freq: Every day | ORAL | Status: DC
  Administered 2013-06-21 – 2013-06-24 (×4): 40 mg via ORAL
  Filled 2013-06-20 (×4): qty 1

## 2013-06-20 MED ORDER — DEXTROSE 5 % IV SOLN
1.0000 g | INTRAVENOUS | Status: DC
  Administered 2013-06-21 – 2013-06-24 (×4): 1 g via INTRAVENOUS
  Filled 2013-06-20 (×4): qty 10

## 2013-06-20 MED ORDER — OSELTAMIVIR PHOSPHATE 75 MG PO CAPS
75.0000 mg | ORAL_CAPSULE | Freq: Two times a day (BID) | ORAL | Status: DC
  Administered 2013-06-20: 75 mg via ORAL
  Filled 2013-06-20 (×4): qty 1

## 2013-06-20 MED ORDER — SODIUM CHLORIDE 0.9 % IV SOLN
INTRAVENOUS | Status: DC
  Administered 2013-06-20 – 2013-06-21 (×2): via INTRAVENOUS

## 2013-06-20 MED ORDER — DEXTROSE 5 % IV SOLN
1.0000 g | Freq: Once | INTRAVENOUS | Status: AC
  Administered 2013-06-20: 1 g via INTRAVENOUS
  Filled 2013-06-20: qty 10

## 2013-06-20 MED ORDER — SERTRALINE HCL 100 MG PO TABS
100.0000 mg | ORAL_TABLET | Freq: Every day | ORAL | Status: DC
  Administered 2013-06-21 – 2013-06-24 (×4): 100 mg via ORAL
  Filled 2013-06-20 (×4): qty 1

## 2013-06-20 MED ORDER — CEFTRIAXONE SODIUM 1 G IJ SOLR: 1.0000 g | INTRAMUSCULAR | Status: DC

## 2013-06-20 MED ORDER — SODIUM CHLORIDE 0.9 % IV BOLUS (SEPSIS)
1000.0000 mL | Freq: Once | INTRAVENOUS | Status: AC
Start: 2013-06-20 — End: 2013-06-20
  Administered 2013-06-20: 1000 mL via INTRAVENOUS

## 2013-06-20 MED ORDER — ONDANSETRON HCL 4 MG/2ML IJ SOLN: 4.0000 mg | Freq: Four times a day (QID) | INTRAMUSCULAR | Status: DC | PRN

## 2013-06-20 MED ORDER — DOXYCYCLINE HYCLATE 100 MG PO TABS
100.0000 mg | ORAL_TABLET | Freq: Two times a day (BID) | ORAL | Status: DC
Start: 2013-06-20 — End: 2013-06-20

## 2013-06-20 MED ORDER — AMLODIPINE BESYLATE 2.5 MG PO TABS
2.5000 mg | ORAL_TABLET | Freq: Every day | ORAL | Status: DC
  Administered 2013-06-21 – 2013-06-24 (×4): 2.5 mg via ORAL
  Filled 2013-06-20 (×4): qty 1

## 2013-06-20 MED ORDER — ASPIRIN 81 MG PO CHEW
81.0000 mg | CHEWABLE_TABLET | Freq: Every day | ORAL | Status: DC
  Administered 2013-06-21 – 2013-06-24 (×4): 81 mg via ORAL
  Filled 2013-06-20 (×4): qty 1

## 2013-06-20 MED ORDER — OXYBUTYNIN CHLORIDE 5 MG PO TABS
5.0000 mg | ORAL_TABLET | Freq: Two times a day (BID) | ORAL | Status: DC
  Administered 2013-06-20 – 2013-06-24 (×8): 5 mg via ORAL
  Filled 2013-06-20 (×9): qty 1

## 2013-06-20 MED ORDER — POLYETHYLENE GLYCOL 3350 17 G PO PACK
17.0000 g | PACK | Freq: Every day | ORAL | Status: DC | PRN
  Filled 2013-06-20: qty 1

## 2013-06-20 MED ORDER — HEPARIN SODIUM (PORCINE) 5000 UNIT/ML IJ SOLN
5000.0000 [IU] | Freq: Three times a day (TID) | INTRAMUSCULAR | Status: DC
  Administered 2013-06-20 – 2013-06-23 (×10): 5000 [IU] via SUBCUTANEOUS
  Filled 2013-06-20 (×14): qty 1

## 2013-06-20 MED ORDER — DOXYCYCLINE HYCLATE 100 MG PO TABS: 100.0000 mg | ORAL_TABLET | Freq: Once | ORAL | Status: DC

## 2013-06-20 MED ORDER — BUDESONIDE-FORMOTEROL FUMARATE 80-4.5 MCG/ACT IN AERO
2.0000 | INHALATION_SPRAY | Freq: Two times a day (BID) | RESPIRATORY_TRACT | Status: DC
  Administered 2013-06-21 – 2013-06-24 (×6): 2 via RESPIRATORY_TRACT
  Filled 2013-06-20 (×2): qty 6.9

## 2013-06-20 MED ORDER — ONDANSETRON HCL 4 MG PO TABS: 4.0000 mg | ORAL_TABLET | Freq: Four times a day (QID) | ORAL | Status: DC | PRN

## 2013-06-20 MED ORDER — DOXYCYCLINE HYCLATE 100 MG IV SOLR
100.0000 mg | Freq: Once | INTRAVENOUS | Status: DC
  Filled 2013-06-20: qty 100

## 2013-06-20 MED ORDER — ENSURE COMPLETE PO LIQD
237.0000 mL | Freq: Three times a day (TID) | ORAL | Status: DC
  Administered 2013-06-20 – 2013-06-24 (×8): 237 mL via ORAL

## 2013-06-20 NOTE — ED Notes (Signed)
Pt has finished eating dinner. Report has been called. Pt did not bring his dentures. Pt's daughter has his hearing aids. Daughter reports per wife he is a DNR.

## 2013-06-20 NOTE — ED Notes (Signed)
Per EMS- Pt comes from home where he lives with his wife, who is currently being treated for pneumonia. Reports generalized weakness for several days only getting up to use the bathroom. Lung sounds wheezing, rhonchi, rales, cough. Given 5 mg albuterol given. Pt is a x 4. Follows commands. HR 60 AFIB rhythm. BP 171/68, CBG 110, difficult time getting oxygen saturation.

## 2013-06-20 NOTE — ED Notes (Signed)
Pt's daughter is at beside.

## 2013-06-20 NOTE — ED Provider Notes (Signed)
CSN: [id number]     Arrival date & time 06/20/13  1426 History   First MD Initiated Contact with Patient 06/20/13 1426     Chief Complaint  Patient presents with  . Weakness     (Consider location/radiation/quality/duration/timing/severity/associated sxs/prior Treatment) Patient is a 80 y.o. male presenting with weakness.  Weakness   Pt with multiple medical problems has been taking care of his wife at home who was recently diagnosed with PNA. He has had several days of general malaise, weakness, poor PO intake, cough and wheezing but no vomiting, diarrhea or fever. He has had pneumonia in the past as well. No chest pain  Past Medical History  Diagnosis Date  . COPD (chronic obstructive pulmonary disease)   . GERD (gastroesophageal reflux disease)   . Prostate cancer     "had prostate removed and had chemo" (02/09/2012)  . Depression   . DVT (deep venous thrombosis)   . Alzheimer's disease   . High cholesterol   . Asthma   . Hypertension   . Pneumonia     "4-5 times thru my life" (02/09/2012)  . Chronic bronchitis     "get it q year" (02/09/2012)  . Exertional dyspnea     "sometimes" (02/09/2012)  . History of blood transfusion     "when prostate removed" (02/09/2012)  . Arthritis     "in my fingers"  (02/09/2012)   Past Surgical History  Procedure Laterality Date  . Tonsillectomy  ~ 1938  . Appendectomy  1950's?  . Cataract extraction w/ intraocular lens  implant, bilateral    . Prostatectomy    . Tear duct probing      "have had it done twice; left eye only?" (02/09/2012)  . Eye surgery     No family history on file. History  Substance Use Topics  . Smoking status: Former Smoker -- 1.00 packs/day for 44 years    Types: Cigarettes  . Smokeless tobacco: Never Used     Comment: 02/09/2012 " stopped smoking cigarrette > 30 years ago"  . Alcohol Use: 1.2 oz/week    1 Cans of beer, 1 Shots of liquor per week     Comment: 02/09/2012 "beer w/dinner  or screwdriver 1-2 X/wk"     Review of Systems  Neurological: Positive for weakness.   All other systems reviewed and are negative except as noted in HPI.     Allergies  Eggs or egg-derived products; Latex; Peanuts; Penicillins; Erythromycin; Fish allergy; and Sulfa antibiotics  Home Medications   Current Outpatient Rx  Name  Route  Sig  Dispense  Refill  . albuterol (PROVENTIL HFA;VENTOLIN HFA) 108 (90 BASE) MCG/ACT inhaler   Inhalation   Inhale 2 puffs into the lungs every 6 (six) hours as needed.         Marland Kitchen albuterol (PROVENTIL) (5 MG/ML) 0.5% nebulizer solution   Nebulization   Take 0.5 mLs (2.5 mg total) by nebulization every 2 (two) hours as needed for wheezing.   20 mL   0   . amLODipine (NORVASC) 5 MG tablet   Oral   Take 2.5 mg by mouth daily.          . budesonide-formoterol (SYMBICORT) 80-4.5 MCG/ACT inhaler   Inhalation   Inhale 2 puffs into the lungs 2 (two) times daily.         . cholecalciferol (VITAMIN D) 1000 UNITS tablet   Oral   Take 1,000 Units by mouth daily.         Marland Kitchen  galantamine (RAZADYNE) 12 MG tablet   Oral   Take 12 mg by mouth 2 (two) times daily.         . mirtazapine (REMERON) 15 MG tablet   Oral   Take 30 mg by mouth at bedtime.         . Multiple Vitamin (MULTIVITAMIN WITH MINERALS) TABS   Oral   Take 1 tablet by mouth daily.         . multivitamin-lutein (OCUVITE-LUTEIN) CAPS   Oral   Take 1 capsule by mouth daily.         Marland Kitchen omeprazole (PRILOSEC) 20 MG capsule   Oral   Take 20 mg by mouth daily.         . ondansetron (ZOFRAN) 4 MG tablet   Oral   Take 4 mg by mouth 2 (two) times daily as needed for nausea or vomiting.         Marland Kitchen oxybutynin (DITROPAN) 5 MG tablet   Oral   Take 5 mg by mouth 2 (two) times daily.         . polyvinyl alcohol (LIQUIFILM TEARS) 1.4 % ophthalmic solution   Both Eyes   Place 1 drop into both eyes as needed.   15 mL   0   . promethazine (PHENERGAN) 25 MG tablet   Oral   Take 25 mg by mouth  every 6 (six) hours as needed. For nausea         . sertraline (ZOLOFT) 100 MG tablet   Oral   Take 100 mg by mouth daily.         Marland Kitchen aspirin 81 MG chewable tablet   Oral   Chew 81 mg by mouth daily.         . feeding supplement (ENSURE COMPLETE) LIQD   Oral   Take 237 mLs by mouth 3 (three) times daily between meals.   30 Bottle   0   . triamcinolone (KENALOG) 0.025 % cream   Topical   Apply topically 2 (two) times daily.   30 g   0    BP 128/50  Pulse 89  Temp(Src) 97.9 F (36.6 C) (Oral)  Resp 18  Wt 134 lb (60.782 kg)  SpO2 96% Physical Exam  Nursing note and vitals reviewed. Constitutional: He is oriented to person, place, and time. He appears well-developed and well-nourished.  HENT:  Head: Normocephalic and atraumatic.  Eyes: EOM are normal. Pupils are equal, round, and reactive to light. Right eye exhibits discharge. Left eye exhibits discharge. Right conjunctiva is injected. Left conjunctiva is injected.  Neck: Normal range of motion. Neck supple.  Cardiovascular: Normal rate, normal heart sounds and intact distal pulses.   Pulmonary/Chest: Effort normal. No respiratory distress. He has wheezes. He has rales. He exhibits no tenderness.  Abdominal: Bowel sounds are normal. He exhibits no distension. There is no tenderness.  Musculoskeletal: Normal range of motion. He exhibits no edema and no tenderness.  Neurological: He is alert and oriented to person, place, and time. He has normal strength. No cranial nerve deficit or sensory deficit.  Skin: Skin is warm and dry. No rash noted.  Psychiatric: He has a normal mood and affect.    ED Course  Procedures (including critical care time) Labs Review Labs Reviewed  CBC WITH DIFFERENTIAL - Abnormal; Notable for the following:    WBC 19.5 (*)    Neutrophils Relative % 81 (*)    Neutro Abs 15.7 (*)    Lymphocytes  Relative 10 (*)    Monocytes Absolute 1.9 (*)    All other components within normal limits   BASIC METABOLIC PANEL - Abnormal; Notable for the following:    Sodium 150 (*)    Glucose, Bld 131 (*)    BUN 41 (*)    Creatinine, Ser 1.53 (*)    GFR calc non Af Amer 39 (*)    GFR calc Af Amer 45 (*)    All other components within normal limits  URINE CULTURE  CULTURE, BLOOD (ROUTINE X 2)  CULTURE, BLOOD (ROUTINE X 2)  CULTURE, EXPECTORATED SPUTUM-ASSESSMENT  GRAM STAIN  TROPONIN I  URINALYSIS, ROUTINE W REFLEX MICROSCOPIC  INFLUENZA PANEL BY PCR (TYPE A & B, H1N1)  SODIUM, URINE, RANDOM  URINALYSIS, ROUTINE W REFLEX MICROSCOPIC  OSMOLALITY, URINE  OSMOLALITY  CREATININE, URINE, RANDOM  LEGIONELLA ANTIGEN, URINE  STREP PNEUMONIAE URINARY ANTIGEN  CBC  CREATININE, SERUM   Imaging Review Dg Chest 2 View  06/20/2013   CLINICAL DATA:  Shortness of breath  EXAM: CHEST  2 VIEW  COMPARISON:  06/17/2012  FINDINGS: Cardiac shadow is stable. Chronic changes are noted in the right lung base but slightly increased from the prior exam which may represent some acute on chronic infiltrate. Some patchy infiltrative changes noted in the left mid lung. Compression deformity is noted at the thoracolumbar junction which appears stable from the prior exam.  IMPRESSION: Bilateral infiltrative changes in the left mid lung and right base. The changes in the right base appear to be acute on chronic.   Electronically Signed   By: Inez Catalina M.D.   On: 06/20/2013 16:04    EKG Interpretation    Date/Time:  Thursday June 20 2013 14:48:13 EST Ventricular Rate:  90 PR Interval:  141 QRS Duration: 151 QT Interval:  406 QTC Calculation: 497 R Axis:   -47 Text Interpretation:  Sinus rhythm Atrial premature complex Left bundle branch block No significant change since last tracing Confirmed by Angelique Chevalier  MD, Alicha Raspberry [phone]) on 06/20/2013 3:01:46 PM            MDM   Final diagnoses:  CAP (community acquired pneumonia)  Hypernatremia  Dehydration  Conjunctivitis    Labs and imaging  reviewed, hypernatremia indicating dehydration. CXR concerning for PNA. Will treat as CAP. Discussed with hospitalist who will admit,.     Nery Frappier B. Karle Starch, MD 06/20/13 [phone]

## 2013-06-20 NOTE — ED Notes (Signed)
Pt meal arrived to bedside. Phlebotomy collecting blood. Waiting for room assignment.

## 2013-06-20 NOTE — ED Notes (Signed)
EDP at bedside talking with patient's daughter.

## 2013-06-20 NOTE — H&P (Signed)
Patient Demographics  [name], is a 80 y.o. male  MRN: [id number]   DOB - 02/13/1923  Admit Date - 06/20/2013  Outpatient Primary MD for the patient is FRIED, Jaymes Graff, MD   With History of -  Past Medical History  Diagnosis Date  . COPD (chronic obstructive pulmonary disease)   . GERD (gastroesophageal reflux disease)   . Prostate cancer     "had prostate removed and had chemo" (02/09/2012)  . Depression   . DVT (deep venous thrombosis)   . Alzheimer's disease   . High cholesterol   . Asthma   . Hypertension   . Pneumonia     "4-5 times thru my life" (02/09/2012)  . Chronic bronchitis     "get it q year" (02/09/2012)  . Exertional dyspnea     "sometimes" (02/09/2012)  . History of blood transfusion     "when prostate removed" (02/09/2012)  . Arthritis     "in my fingers"  (02/09/2012)      Past Surgical History  Procedure Laterality Date  . Tonsillectomy  ~ 1938  . Appendectomy  1950's?  . Cataract extraction w/ intraocular lens  implant, bilateral    . Prostatectomy    . Tear duct probing      "have had it done twice; left eye only?" (02/09/2012)  . Eye surgery      in for   Chief Complaint  Patient presents with  . Weakness     HPI  [name]  is a 80 y.o. male, with history of dementia, COPD, remote history of DVT, hypertension, eyelid surgery in the past who lives at his home with his wife, apparently wife was diagnosed with pneumonia a few days ago, patient himself had nausea vomiting and diarrhea about 2 weeks ago which resolved, a few days ago he started experiencing a productive cough, some shortness of breath and fevers, his symptoms did not improve so family brought him to the ER where his workup was consistent with community-acquired pneumonia, acute renal failure and  dehydration along with hyponatremia and I was called to admit the patient.   Patient is pleasantly demented and besides cough he has no subjective complaints, denies any chest pain palpitations, currently no abdominal pain or diarrhea, no focal weakness.    Review of Systems    In addition to the HPI above,  No Fever-chills, No Headache, No changes with Vision or hearing, No problems swallowing food or Liquids, No Chest pain, positive productive Cough and Shortness of Breath, No Abdominal pain, No Nausea or Vommitting, Bowel movements are regular, No Blood in stool or Urine, No dysuria, No new skin rashes or bruises, No new joints pains-aches,  No new weakness, tingling, numbness in any extremity, No recent weight gain or loss, No polyuria, polydypsia or polyphagia, No significant Mental Stressors.  A full 10 point Review of Systems was done, except as stated above, all other Review of Systems were  negative.   Social History History  Substance Use Topics  . Smoking status: Former Smoker -- 1.00 packs/day for 44 years    Types: Cigarettes  . Smokeless tobacco: Never Used     Comment: 02/09/2012 " stopped smoking cigarrette > 30 years ago"  . Alcohol Use: 1.2 oz/week    1 Cans of beer, 1 Shots of liquor per week     Comment: 02/09/2012 "beer w/dinner  or screwdriver 1-2 X/wk"      Family History No history of lung cancer  Prior to Admission medications   Medication Sig Start Date End Date Taking? Authorizing Provider  albuterol (PROVENTIL HFA;VENTOLIN HFA) 108 (90 BASE) MCG/ACT inhaler Inhale 2 puffs into the lungs every 6 (six) hours as needed.   Yes Historical Provider, MD  albuterol (PROVENTIL) (5 MG/ML) 0.5% nebulizer solution Take 0.5 mLs (2.5 mg total) by nebulization every 2 (two) hours as needed for wheezing. 02/13/12  Yes Marianne L York, PA-C  amLODipine (NORVASC) 5 MG tablet Take 2.5 mg by mouth daily.    Yes Historical Provider, MD  aspirin 81 MG chewable tablet  Chew 81 mg by mouth daily.   Yes Historical Provider, MD  budesonide-formoterol (SYMBICORT) 80-4.5 MCG/ACT inhaler Inhale 2 puffs into the lungs 2 (two) times daily.   Yes Historical Provider, MD  cholecalciferol (VITAMIN D) 1000 UNITS tablet Take 1,000 Units by mouth daily.   Yes Historical Provider, MD  feeding supplement (ENSURE COMPLETE) LIQD Take 237 mLs by mouth 3 (three) times daily between meals. 02/13/12  Yes Marianne L York, PA-C  galantamine (RAZADYNE) 12 MG tablet Take 12 mg by mouth 2 (two) times daily.   Yes Historical Provider, MD  mirtazapine (REMERON) 15 MG tablet Take 30 mg by mouth at bedtime.   Yes Historical Provider, MD  multivitamin-lutein (OCUVITE-LUTEIN) CAPS Take 1 capsule by mouth daily.   Yes Historical Provider, MD  omeprazole (PRILOSEC) 20 MG capsule Take 20 mg by mouth daily.   Yes Historical Provider, MD  ondansetron (ZOFRAN) 4 MG tablet Take 4 mg by mouth 2 (two) times daily as needed for nausea or vomiting.   Yes Historical Provider, MD  oxybutynin (DITROPAN) 5 MG tablet Take 5 mg by mouth 2 (two) times daily.   Yes Historical Provider, MD  polyvinyl alcohol (LIQUIFILM TEARS) 1.4 % ophthalmic solution Place 1 drop into both eyes as needed for dry eyes.   Yes Historical Provider, MD  sertraline (ZOLOFT) 100 MG tablet Take 100 mg by mouth daily.   Yes Historical Provider, MD  triamcinolone (KENALOG) 0.025 % cream Apply topically 2 (two) times daily. 02/13/12  Yes Bobby Rumpf York, PA-C    Allergies  Allergen Reactions  . Eggs Or Egg-Derived Products Nausea And Vomiting and Rash  . Latex Rash  . Peanuts [Peanut Oil] Anaphylaxis and Swelling    "swelling of the face"  . Penicillins Rash  . Erythromycin     "I don't remember"  . Fish Allergy     "I never eat fish except for halibut"  . Sulfa Antibiotics Other (See Comments)    Reaction unknown    Physical Exam  Vitals  Blood pressure 138/55, pulse 99, temperature 97.9 F (36.6 C), temperature source Oral,  resp. rate 28, weight 60.782 kg (134 lb), SpO2 89.00%.   1. General elderly frail white gentleman lying in bed in NAD,     2. Normal affect and insight, Not Suicidal or Homicidal, Awake but pleasantly confused  3. No F.N deficits, ALL  C.Nerves Intact, Strength 5/5 all 4 extremities, Sensation intact all 4 extremities, Plantars down going.  4. Ears and Eyes appear Normal, Conjunctivae clear, PERRLA. Moist Oral Mucosa. Droopy eyelids  5. Supple Neck, No JVD, No cervical lymphadenopathy appriciated, No Carotid Bruits.  6. Symmetrical Chest wall movement, Good air movement bilaterally, coarse bilateral breath sounds  7. RRR, No Gallops, Rubs or Murmurs, No Parasternal Heave.  8. Positive Bowel Sounds, Abdomen Soft, Non tender, No organomegaly appriciated,No rebound -guarding or rigidity.  9.  No Cyanosis, dry scaly skin with reduced turgor  10. Good muscle tone,  joints appear normal , no effusions, Normal ROM.  11. No Palpable Lymph Nodes in Neck or Axillae     Data Review  CBC  Recent Labs Lab 06/20/13 1446  WBC 19.5*  HGB 13.9  HCT 41.2  PLT 270  MCV 86.7  MCH 29.3  MCHC 33.7  RDW 14.7  LYMPHSABS 1.9  MONOABS 1.9*  EOSABS 0.0  BASOSABS 0.0   ------------------------------------------------------------------------------------------------------------------  Chemistries   Recent Labs Lab 06/20/13 1446  NA 150*  K 4.1  CL 110  CO2 20  GLUCOSE 131*  BUN 41*  CREATININE 1.53*  CALCIUM 9.0   ------------------------------------------------------------------------------------------------------------------ CrCl is unknown because both a height and weight (above a minimum accepted value) are required for this calculation. ------------------------------------------------------------------------------------------------------------------ No results found for this basename: TSH, T4TOTAL, FREET3, T3FREE, THYROIDAB,  in the last 72 hours   Coagulation profile No  results found for this basename: INR, PROTIME,  in the last 168 hours ------------------------------------------------------------------------------------------------------------------- No results found for this basename: DDIMER,  in the last 72 hours -------------------------------------------------------------------------------------------------------------------  Cardiac Enzymes  Recent Labs Lab 06/20/13 1446  TROPONINI <0.30   ------------------------------------------------------------------------------------------------------------------ No components found with this basename: POCBNP,    ---------------------------------------------------------------------------------------------------------------  Urinalysis    Component Value Date/Time   COLORURINE YELLOW 02/10/2012 0636   APPEARANCEUR CLOUDY* 02/10/2012 0636   LABSPEC 1.020 02/10/2012 0636   PHURINE 5.0 02/10/2012 0636   GLUCOSEU NEGATIVE 02/10/2012 0636   HGBUR NEGATIVE 02/10/2012 0636   BILIRUBINUR SMALL* 02/10/2012 0636   KETONESUR 15* 02/10/2012 0636   PROTEINUR 30* 02/10/2012 0636   UROBILINOGEN 1.0 02/10/2012 0636   NITRITE NEGATIVE 02/10/2012 0636   LEUKOCYTESUR NEGATIVE 02/10/2012 0636    ----------------------------------------------------------------------------------------------------------------  Imaging results:   Dg Chest 2 View  06/20/2013   CLINICAL DATA:  Shortness of breath  EXAM: CHEST  2 VIEW  COMPARISON:  06/17/2012  FINDINGS: Cardiac shadow is stable. Chronic changes are noted in the right lung base but slightly increased from the prior exam which may represent some acute on chronic infiltrate. Some patchy infiltrative changes noted in the left mid lung. Compression deformity is noted at the thoracolumbar junction which appears stable from the prior exam.  IMPRESSION: Bilateral infiltrative changes in the left mid lung and right base. The changes in the right base appear to be acute on chronic.    Electronically Signed   By: Inez Catalina M.D.   On: 06/20/2013 16:04    My personal review of EKG: Rhythm NSR, Old LBBB , no Acute ST changes    Assessment & Plan   1. Community-acquired pneumonia - will be admitted to a MedSurg bed, blood and sputum cultures, check influenza PCR, place him on Tamiflu along with Rocephin and doxycycline as he is allergic to erythromycin, oxygen and nebulizer treatments as needed.   2. Dehydration with acute renal failure and hypernatremia. Secondary to #1 above causing decreased by mouth intake, and a  recent past history of nausea vomiting and diarrhea. Check urine electrolytes, IV fluids for hydration, repeat BMP in the morning.   3. Alzheimer's dementia. Continue home medications, supportive care, fall precautions, dysphagia 1 diet as he is not wearing his dentures.    4. History of COPD. No acute issues no wheezing on exam. Supportive care as in #1 above.    5. Old left bundle branch block. Stable.   DVT Prophylaxis Heparin    AM Labs Ordered, also please review Full Orders  Family Communication: Admission, patients condition and plan of care including tests being ordered have been discussed with the patient and daughter who indicate understanding and agree with the plan and Code Status.  Code Status Full  Likely DC to  Home  Condition GUARDED     Time spent in minutes : 35    Aliveah Gallant K M.D on 06/20/2013 at 4:45 PM  Between 7am to 7pm - Pager - [phone]  After 7pm go to [email or web address] - password [email or web address]  And look for the night coverage person covering me after hours  [facility] Office  [phone]

## 2013-06-20 NOTE — ED Notes (Signed)
Dr Singh at bedside

## 2013-06-21 LAB — BASIC METABOLIC PANEL
BUN: 30 mg/dL — ABNORMAL HIGH (ref 6–23)
CALCIUM: 8.3 mg/dL — AB (ref 8.4–10.5)
CO2: 22 mEq/L (ref 19–32)
Chloride: 117 mEq/L — ABNORMAL HIGH (ref 96–112)
Creatinine, Ser: 1.5 mg/dL — ABNORMAL HIGH (ref 0.50–1.35)
GFR calc Af Amer: 46 mL/min — ABNORMAL LOW (ref >90)
GFR, EST NON AFRICAN AMERICAN: 40 mL/min — AB (ref >90)
Glucose, Bld: 124 mg/dL — ABNORMAL HIGH (ref 70–99)
Potassium: 4.5 mEq/L (ref 3.7–5.3)
SODIUM: 149 meq/L — AB (ref 137–147)

## 2013-06-21 LAB — CBC
HCT: 34.8 % — ABNORMAL LOW (ref 39.0–52.0)
Hemoglobin: 11.3 g/dL — ABNORMAL LOW (ref 13.0–17.0)
MCH: 28.3 pg (ref 26.0–34.0)
MCHC: 32.5 g/dL (ref 30.0–36.0)
MCV: 87 fL (ref 78.0–100.0)
PLATELETS: 201 10*3/uL (ref 150–400)
RBC: 4 MIL/uL — ABNORMAL LOW (ref 4.22–5.81)
RDW: 14.8 % (ref 11.5–15.5)
WBC: 15.4 10*3/uL — AB (ref 4.0–10.5)

## 2013-06-21 LAB — URINE MICROSCOPIC-ADD ON

## 2013-06-21 LAB — LEGIONELLA ANTIGEN, URINE: LEGIONELLA ANTIGEN, URINE: NEGATIVE

## 2013-06-21 LAB — URINALYSIS, ROUTINE W REFLEX MICROSCOPIC
Bilirubin Urine: NEGATIVE
Glucose, UA: NEGATIVE mg/dL
Hgb urine dipstick: NEGATIVE
Ketones, ur: NEGATIVE mg/dL
Leukocytes, UA: NEGATIVE
NITRITE: NEGATIVE
PH: 5.5 (ref 5.0–8.0)
Protein, ur: 30 mg/dL — AB
SPECIFIC GRAVITY, URINE: 1.022 (ref 1.005–1.030)
UROBILINOGEN UA: 1 mg/dL (ref 0.0–1.0)

## 2013-06-21 LAB — STREP PNEUMONIAE URINARY ANTIGEN: Strep Pneumo Urinary Antigen: NEGATIVE

## 2013-06-21 LAB — OSMOLALITY, URINE: OSMOLALITY UR: 608 mosm/kg (ref 390–1090)

## 2013-06-21 LAB — SODIUM, URINE, RANDOM: Sodium, Ur: 40 mEq/L

## 2013-06-21 LAB — CREATININE, URINE, RANDOM: Creatinine, Urine: 114.57 mg/dL

## 2013-06-21 MED ORDER — PNEUMOCOCCAL VAC POLYVALENT 25 MCG/0.5ML IJ INJ
0.5000 mL | INJECTION | INTRAMUSCULAR | Status: AC
  Administered 2013-06-22: 0.5 mL via INTRAMUSCULAR
  Filled 2013-06-21: qty 0.5

## 2013-06-21 MED ORDER — SODIUM CHLORIDE 0.45 % IV SOLN
INTRAVENOUS | Status: DC
  Administered 2013-06-21 – 2013-06-22 (×2): via INTRAVENOUS
  Administered 2013-06-23: 20 mL/h via INTRAVENOUS

## 2013-06-21 MED ORDER — OSELTAMIVIR PHOSPHATE 30 MG PO CAPS
30.0000 mg | ORAL_CAPSULE | Freq: Every day | ORAL | Status: DC
  Filled 2013-06-21: qty 1

## 2013-06-21 MED ORDER — DOXYCYCLINE HYCLATE 100 MG IV SOLR
100.0000 mg | Freq: Two times a day (BID) | INTRAVENOUS | Status: AC
  Administered 2013-06-21 – 2013-06-22 (×3): 100 mg via INTRAVENOUS
  Filled 2013-06-21 (×4): qty 100

## 2013-06-21 MED ORDER — BIOTENE DRY MOUTH MT LIQD
15.0000 mL | Freq: Two times a day (BID) | OROMUCOSAL | Status: DC
  Administered 2013-06-21 – 2013-06-24 (×5): 15 mL via OROMUCOSAL

## 2013-06-21 MED ORDER — ALBUTEROL SULFATE (2.5 MG/3ML) 0.083% IN NEBU: 2.5000 mg | INHALATION_SOLUTION | RESPIRATORY_TRACT | Status: DC | PRN

## 2013-06-21 NOTE — Evaluation (Signed)
Clinical/Bedside Swallow Evaluation Patient Details  Name: [name] MRN: [id number] Date of Birth: June 23, 1922  Today's Date: 06/21/2013 Time: 0017-4944 SLP Time Calculation (min): 32 min  Past Medical History:  Past Medical History  Diagnosis Date  . COPD (chronic obstructive pulmonary disease)   . GERD (gastroesophageal reflux disease)   . Prostate cancer     "had prostate removed and had chemo" (02/09/2012)  . Depression   . DVT (deep venous thrombosis)   . Alzheimer's disease   . High cholesterol   . Asthma   . Hypertension   . Pneumonia     "4-5 times thru my life" (02/09/2012)  . Chronic bronchitis     "get it q year" (02/09/2012)  . Exertional dyspnea     "sometimes" (02/09/2012)  . History of blood transfusion     "when prostate removed" (02/09/2012)  . Arthritis     "in my fingers"  (02/09/2012)   Past Surgical History:  Past Surgical History  Procedure Laterality Date  . Tonsillectomy  ~ 1938  . Appendectomy  1950's?  . Cataract extraction w/ intraocular lens  implant, bilateral    . Prostatectomy    . Tear duct probing      "have had it done twice; left eye only?" (02/09/2012)  . Eye surgery     HPI:  [name]  is a 80 y.o. male, with history of dementia, COPD, remote history of DVT, hypertension, eyelid surgery in the past who lives at his home with his wife, apparently wife was diagnosed with pneumonia a few days ago, patient himself had nausea vomiting and diarrhea about 2 weeks ago which resolved, a few days ago he started experiencing a productive cough, some shortness of breath and fevers, his symptoms did not improve so family brought him to the ER where his workup was consistent with community-acquired pneumonia, acute renal failure and dehydration along with hyponatremia.   Assessment / Plan / Recommendation Clinical Impression  Pt presents with congested cough at baseline, making I difficult to fully assess aspiration risk at bedside. He does  present with mildly increased SOB with PO intake. Frequent, intermittent coughing was noted throughout intake that did not appear to fluctuate with different consistencies. Frequent eructation observed across intake as well, with known h/o GERD. Given current RLL PNA, respiratory status, and questionable s/s of aspiration observed today, recommend objective test to further assess risk.    Aspiration Risk  Moderate    Diet Recommendation Dysphagia 1 (Puree);Thin liquid   Liquid Administration via: Cup;No straw Medication Administration: Whole meds with puree Supervision: Patient able to self feed;Full supervision/cueing for compensatory strategies Compensations: Slow rate;Small sips/bites Postural Changes and/or Swallow Maneuvers: Seated upright 90 degrees;Upright 30-60 min after meal    Other  Recommendations Recommended Consults: MBS   Follow Up Recommendations  Skilled Nursing facility;24 hour supervision/assistance    Frequency and Duration        Pertinent Vitals/Pain N/A    SLP Swallow Goals  TBD pending objective testing   Swallow Study Prior Functional Status       General Date of Onset: 06/20/13 HPI: [name]  is a 80 y.o. male, with history of dementia, COPD, remote history of DVT, hypertension, eyelid surgery in the past who lives at his home with his wife, apparently wife was diagnosed with pneumonia a few days ago, patient himself had nausea vomiting and diarrhea about 2 weeks ago which resolved, a few days ago he started experiencing a productive cough, some  shortness of breath and fevers, his symptoms did not improve so family brought him to the ER where his workup was consistent with community-acquired pneumonia, acute renal failure and dehydration along with hyponatremia. Type of Study: Bedside swallow evaluation Previous Swallow Assessment: 1 previous MBS in chart; unable to view, however wife denies the need for altered diets in the past Diet Prior to this  Study: Dysphagia 1 (puree);Thin liquids Temperature Spikes Noted: No Respiratory Status: Nasal cannula (2 L) History of Recent Intubation: No Behavior/Cognition: Alert;Cooperative;Pleasant mood;Requires cueing Oral Cavity - Dentition: Missing dentition (pt has top dentures but they are ill-fitting; unable to wear) Self-Feeding Abilities: Able to feed self Patient Positioning: Upright in chair Baseline Vocal Quality: Clear;Low vocal intensity Volitional Cough: Strong;Congested Volitional Swallow: Able to elicit    Oral/Motor/Sensory Function     Ice Chips Ice chips: Not tested   Thin Liquid Thin Liquid: Impaired Presentation: Cup;Self Fed;Straw Pharyngeal  Phase Impairments: Suspected delayed Swallow;Cough - Immediate;Cough - Delayed    Nectar Thick Nectar Thick Liquid: Impaired Presentation: Cup;Self Fed Pharyngeal Phase Impairments: Suspected delayed Swallow;Cough - Delayed   Honey Thick Honey Thick Liquid: Not tested   Puree Puree: Impaired Presentation: Self Fed;Spoon Pharyngeal Phase Impairments: Suspected delayed Swallow;Cough - Delayed   Solid   GO    Solid: Not tested         [name], M.A. CCC-SLP [phone]  [name] 06/21/2013,3:00 PM

## 2013-06-21 NOTE — Care Management Note (Unsigned)
    Page 1 of 1   06/21/2013     5:14:21 PM   CARE MANAGEMENT NOTE 06/21/2013  Patient:  [name], [name]   Account Number:  [id number]  Date Initiated:  06/21/2013  Documentation initiated by:  Tomi Bamberger  Subjective/Objective Assessment:   dx pna  admit- lives with spouse.     Action/Plan:   pt eval-rec snf.   Anticipated DC Date:  06/23/2013   Anticipated DC Plan:  SKILLED NURSING FACILITY  In-house referral  Clinical Social Worker      DC Planning Services  CM consult      Choice offered to / List presented to:             Status of service:  In process, will continue to follow Medicare Important Message given?   (If response is "NO", the following Medicare IM given date fields will be blank) Date Medicare IM given:   Date Additional Medicare IM given:    Discharge Disposition:    Per UR Regulation:  Reviewed for med. necessity/level of care/duration of stay  If discussed at [place] of Stay Meetings, dates discussed:    Comments:  06/21/13 [place], BSN [phone] patient lives with spouse, per physical therapy recs SNF, CSW referral.

## 2013-06-21 NOTE — Evaluation (Signed)
Physical Therapy Evaluation Patient Details Name: [name] MRN: [id number] DOB: 08/03/1922 Today's Date: 06/21/2013 Time: 7564-3329 PT Time Calculation (min): 35 min  PT Assessment / Plan / Recommendation History of Present Illness  [name]  is a 80 y.o. male, with history of dementia, COPD, remote history of DVT, hypertension, eyelid surgery in the past who lives at his home with his wife, apparently wife was diagnosed with pneumonia a few days ago, patient himself had nausea vomiting and diarrhea about 2 weeks ago which resolved, a few days ago he started experiencing a productive cough, some shortness of breath and fevers, his symptoms did not improve so family brought him to the ER where his workup was consistent with community-acquired pneumonia, acute renal failure and dehydration along with hyponatremia.  Clinical Impression  Pt admitted with above. Pt currently with functional limitations due to the deficits listed below (see PT Problem List).  Pt will benefit from skilled PT to increase their independence and safety with mobility to allow discharge to the venue listed below. Feel pt may need ST-SNF prior to return home.      PT Assessment  Patient needs continued PT services    Follow Up Recommendations  SNF    Does the patient have the potential to tolerate intense rehabilitation      Barriers to Discharge        Equipment Recommendations  None recommended by PT    Recommendations for Other Services     Frequency Min 3X/week    Precautions / Restrictions Precautions Precautions: Fall   Pertinent Vitals/Pain See flow sheet.      Mobility  Bed Mobility Overal bed mobility: Needs Assistance Bed Mobility: Supine to Sit Supine to sit: Mod assist General bed mobility comments: Assist to bring trunk up. Transfers Overall transfer level: Needs assistance Equipment used: Rolling walker (2 wheeled) Transfers: Sit to/from [facility] Sit to  Stand: Mod assist Stand pivot transfers: Mod assist General transfer comment: Verbal/tactile cues for hand placement and assist to bring hips up.  Used rolling walker for pivot bed to bsc. Ambulation/Gait Ambulation/Gait assistance: Mod assist Ambulation Distance (Feet): 8 Feet Assistive device: Rolling walker (2 wheeled) Gait Pattern/deviations: Step-through pattern;Decreased step length - right;Decreased step length - left;Shuffle;Trunk flexed Gait velocity interpretation: Below normal speed for age/gender General Gait Details: Pt with knees flexed slightly thoughout amb.    Exercises     PT Diagnosis: Difficulty walking;Generalized weakness  PT Problem List: Decreased strength;Decreased activity tolerance;Decreased balance;Decreased mobility;Decreased knowledge of use of DME;Decreased knowledge of precautions PT Treatment Interventions: DME instruction;Gait training;Functional mobility training;Therapeutic activities;Therapeutic exercise;Balance training;Patient/family education     PT Goals(Current goals can be found in the care plan section) Acute Rehab PT Goals Patient Stated Goal: to go to sleep PT Goal Formulation: Patient unable to participate in goal setting Time For Goal Achievement: 06/28/13 Potential to Achieve Goals: Good  Visit Information  Last PT Received On: 06/21/13 Assistance Needed: +1 History of Present Illness: [name]  is a 80 y.o. male, with history of dementia, COPD, remote history of DVT, hypertension, eyelid surgery in the past who lives at his home with his wife, apparently wife was diagnosed with pneumonia a few days ago, patient himself had nausea vomiting and diarrhea about 2 weeks ago which resolved, a few days ago he started experiencing a productive cough, some shortness of breath and fevers, his symptoms did not improve so family brought him to the ER where his workup was consistent with  community-acquired pneumonia, acute renal failure and  dehydration along with hyponatremia.       Prior [place] expects to be discharged to:: Private residence Living Arrangements: Spouse/significant other Available Help at Discharge: Family Type of Home: Apartment Home Access: Level entry Home Layout: One [place]: Shower seat;Grab bars - tub/shower;Walker - 2 wheels;Cane - single point;Wheelchair - manual Prior Function Comments: Pt unable to tell me and no family present. Communication Communication: HOH    Cognition  Cognition Arousal/Alertness: Awake/alert Behavior During Therapy: WFL for tasks assessed/performed Overall Cognitive Status: Within Functional Limits for tasks assessed    Extremity/Trunk Assessment Upper Extremity Assessment Upper Extremity Assessment: Generalized weakness Lower Extremity Assessment Lower Extremity Assessment: Generalized weakness   Balance Balance Overall balance assessment: Needs assistance Sitting-balance support: Bilateral upper extremity supported Sitting balance-Leahy Scale: Poor Sitting balance - Comments: sat EOB x 10 minutes. Postural control: Posterior lean Standing balance support: Bilateral upper extremity supported Standing balance-Leahy Scale: Poor  End of Session PT - End of Session Equipment Utilized During Treatment: Gait belt Activity Tolerance: Patient limited by fatigue Patient left: in chair;with call bell/phone within reach;with chair alarm set Nurse Communication: Mobility status;Other (comment) (leaking condom cath)  GP     [facility] 06/21/2013, 11:06 AM  [facility] PT [phone]

## 2013-06-21 NOTE — Clinical Social Work Psychosocial (Signed)
Clinical Social Work Department BRIEF PSYCHOSOCIAL ASSESSMENT 06/21/2013  Patient:  [name], [name]     Account Number:  [id number]     Admit date:  06/20/2013  Clinical Social Worker:  Lovey Newcomer  Date/Time:  06/21/2013 02:45 PM  Referred by:  Physician  Date Referred:  06/21/2013 Referred for  SNF Placement   Other Referral:   Interview type:  Family Other interview type:   Patient not oriented at time of assessment. CSW interviewed patient's wife by phone.    PSYCHOSOCIAL DATA Living Status:  WIFE Admitted from facility:   Level of care:   Primary support name:  [name] Primary support relationship to patient:  SPOUSE Degree of support available:   Support is limited. Patient lives with wife but wife states that she can't provide the care the patient needs.    CURRENT CONCERNS Current Concerns  Post-Acute Placement   Other Concerns:    SOCIAL WORK ASSESSMENT / PLAN CSW spoke with patient's wife by phone. Wife states that she is really sick and unable to come to hospital at the moment. She agrees that the patient needs to go to SNF for at least short term rehab, if not for long term placement. CSW explained the SNF search process and how his [facility] Medicare plan may limit SNF options. CSW also explained that if patient needs long term placement that patient will need Medicaid or the family will need to pay for long term placement out of pocket. Wife states that private pay is not an option, and that patient was approved for Medicaid in the past. Wife states that she will start Medicaid application for patient as soon as possible. Wife states that she is overwhelmed by taking care of herself and the patient at home. CSW offered emotional support to wife. CSW will assist with DC needs.   Assessment/plan status:  Psychosocial Support/Ongoing Assessment of Needs Other assessment/ plan:   Complete FL2, Fax, PASRR, [employer] Auth   Information/referral to community  resources:   CSW contact information and SNF list left in patient's room.    PATIENT'S/FAMILY'S RESPONSE TO PLAN OF CARE: Patient's wife is agreeable to SNF placement for patient in [facility] (either long term or short term). Wife was pleasant, appropriate, and appreciative of CSW contact. Wife will wait for bed offers.       Liz Beach, [place], [place], [place], [phone]

## 2013-06-21 NOTE — Progress Notes (Signed)
PATIENT DETAILS Name: [name] Age: 80 y.o. Sex: male Date of Birth: 08/06/1922 Admit Date: 06/20/2013 Admitting Physician Thurnell Lose, MD [id number], Jaymes Graff, MD  Subjective: Alert, pleasantly confused but follows commands.  Assessment/Plan: Community acquired pneumonia - Afebrile, downtrending leukocytosis. Clinically improved and nontoxic looking.  - Continue with Rocephin and doxycycline-day 2 - Influenza PCR negative - Blood culture 2/12-negative - Streptococcal urinary antigen negative  Dehydration - Clinically euvolemic, decrease IV fluids  Hypernatremia - Change IV fluid to half-normal, likely secondary to dehydration. - Repeat electrolytes in a.m.  Acute renal failure - Mild elevation in creatinine continues, continue cautious hydration - Urinalysis negative for proteinuria  COPD - No evidence of exacerbation-lungs clear. - Albuterol nebulizer as needed, continue Symbicort  Hypertension - Controlled with amlodipine  Dementia with delirium - Expect sundowning to get worse at night - Continue with Zoloft, mirtazapine and galantamine.  Disposition: Remain inpatient-?SNF on discharge  DVT Prophylaxis: Prophylactic Lovenox   Code Status: DNR confirmed with Spouse over the phone  Family Communication Spouse over the phone on 2/13 Attempted to reach daughter at [phone]  Procedures:  None  CONSULTS:  None  Time spent 40 minutes-which includes 50% of the time with face-to-face with patient/ family and coordinating care related to the above assessment and plan.  MEDICATIONS: Scheduled Meds: . amLODipine  2.5 mg Oral Daily  . antiseptic oral rinse  15 mL Mouth Rinse BID  . aspirin  81 mg Oral Daily  . budesonide-formoterol  2 puff Inhalation BID  . cefTRIAXone (ROCEPHIN)  IV  1 g Intravenous Q24H  . cholecalciferol  1,000 Units Oral Daily  . doxycycline (VIBRAMYCIN) IV  100 mg Intravenous Once  . feeding  supplement (ENSURE COMPLETE)  237 mL Oral TID BM  . galantamine  12 mg Oral BID  . heparin  5,000 Units Subcutaneous 3 times per day  . mirtazapine  30 mg Oral QHS  . multivitamin-lutein  1 capsule Oral Daily  . oxybutynin  5 mg Oral BID  . pantoprazole  40 mg Oral Daily  . [START ON 06/22/2013] pneumococcal 23 valent vaccine  0.5 mL Intramuscular Tomorrow-1000  . sertraline  100 mg Oral Daily  . triamcinolone   Topical BID   Continuous Infusions: . sodium chloride 75 mL/hr at 06/21/13 0808   PRN Meds:.alum & mag hydroxide-simeth, guaiFENesin-dextromethorphan, HYDROcodone-acetaminophen, ondansetron (ZOFRAN) IV, ondansetron, polyethylene glycol, polyvinyl alcohol  Antibiotics: Anti-infectives   Start     Dose/Rate Route Frequency Ordered Stop   06/21/13 1000  cefTRIAXone (ROCEPHIN) 1 g in dextrose 5 % 50 mL IVPB     1 g 100 mL/hr over 30 Minutes Intravenous Every 24 hours 06/20/13 1756 06/28/13 0959   06/21/13 1000  oseltamivir (TAMIFLU) capsule 30 mg  Status:  Discontinued     30 mg Oral Daily 06/21/13 0850 06/21/13 0918   06/20/13 2200  doxycycline (VIBRA-TABS) tablet 100 mg  Status:  Discontinued     100 mg Oral Every 12 hours 06/20/13 1627 06/20/13 1628   06/20/13 2200  oseltamivir (TAMIFLU) capsule 75 mg  Status:  Discontinued     75 mg Oral 2 times daily 06/20/13 1628 06/21/13 0850   06/20/13 1630  azithromycin (ZITHROMAX) 500 mg in dextrose 5 % 250 mL IVPB  Status:  Discontinued     500 mg 250 mL/hr over 60 Minutes Intravenous  Once 06/20/13 1618 06/20/13 1628   06/20/13 1630  cefTRIAXone (ROCEPHIN) 1 g in  dextrose 5 % 50 mL IVPB     1 g 100 mL/hr over 30 Minutes Intravenous  Once 06/20/13 1618 06/20/13 1704   06/20/13 1630  cefTRIAXone (ROCEPHIN) 1 g in dextrose 5 % 50 mL IVPB  Status:  Discontinued     1 g 100 mL/hr over 30 Minutes Intravenous Every 24 hours 06/20/13 1627 06/20/13 1755   06/20/13 1630  doxycycline (VIBRA-TABS) tablet 100 mg  Status:  Discontinued     100  mg Oral  Once 06/20/13 1628 06/20/13 1628   06/20/13 1630  doxycycline (VIBRAMYCIN) 100 mg in dextrose 5 % 250 mL IVPB     100 mg 125 mL/hr over 120 Minutes Intravenous  Once 06/20/13 1628         PHYSICAL EXAM: Vital signs in last 24 hours: Filed Vitals:   06/20/13 2209 06/21/13 0416 06/21/13 1020 06/21/13 1023  BP: 146/65 128/60    Pulse: 87 72    Temp: 98.2 F (36.8 C) 98.9 F (37.2 C)    TempSrc: Oral Oral    Resp: 20 18    Height: 5' 7.5" (1.715 m)     Weight: 55.6 kg (122 lb 9.2 oz) 56.8 kg (125 lb 3.5 oz)    SpO2: 93% 96% 89% 95%    Weight change:  Filed Weights   06/20/13 1437 06/20/13 2209 06/21/13 0416  Weight: 60.782 kg (134 lb) 55.6 kg (122 lb 9.2 oz) 56.8 kg (125 lb 3.5 oz)   Body mass index is 19.31 kg/(m^2).   Gen Exam: Awake,pleasantly confused,clear speech.   Neck: Supple, No JVD.   Chest: B/L Clear-few bibasilar rales CVS: S1 S2 Regular, no murmurs.  Abdomen: soft, BS +, non tender, non distended.  Extremities: no edema, lower extremities warm to touch. Neurologic: Non Focal.   Skin: No Rash.  Wounds: N/A.    Intake/Output from previous day:  Intake/Output Summary (Last 24 hours) at 06/21/13 1158 Last data filed at 06/21/13 0914  Gross per 24 hour  Intake   1160 ml  Output      0 ml  Net   1160 ml     LAB RESULTS: CBC  Recent Labs Lab 06/20/13 1446 06/20/13 1626 06/21/13 0628  WBC 19.5* 16.5* 15.4*  HGB 13.9 13.4 11.3*  HCT 41.2 39.4 34.8*  PLT 270 235 201  MCV 86.7 86.8 87.0  MCH 29.3 29.5 28.3  MCHC 33.7 34.0 32.5  RDW 14.7 14.9 14.8  LYMPHSABS 1.9  --   --   MONOABS 1.9*  --   --   EOSABS 0.0  --   --   BASOSABS 0.0  --   --     Chemistries   Recent Labs Lab 06/20/13 1446 06/20/13 1626 06/21/13 0628  NA 150*  --  149*  K 4.1  --  4.5  CL 110  --  117*  CO2 20  --  22  GLUCOSE 131*  --  124*  BUN 41*  --  30*  CREATININE 1.53* 1.43* 1.50*  CALCIUM 9.0  --  8.3*    CBG: No results found for this basename:  GLUCAP,  in the last 168 hours  GFR Estimated Creatinine Clearance: 27.3 ml/min (by C-G formula based on Cr of 1.5).  Coagulation profile No results found for this basename: INR, PROTIME,  in the last 168 hours  Cardiac Enzymes  Recent Labs Lab 06/20/13 1446  TROPONINI <0.30    No components found with this basename: POCBNP,  No results  found for this basename: DDIMER,  in the last 72 hours No results found for this basename: HGBA1C,  in the last 72 hours No results found for this basename: CHOL, HDL, LDLCALC, TRIG, CHOLHDL, LDLDIRECT,  in the last 72 hours No results found for this basename: TSH, T4TOTAL, FREET3, T3FREE, THYROIDAB,  in the last 72 hours No results found for this basename: VITAMINB12, FOLATE, FERRITIN, TIBC, IRON, RETICCTPCT,  in the last 72 hours No results found for this basename: LIPASE, AMYLASE,  in the last 72 hours  Urine Studies No results found for this basename: UACOL, UAPR, USPG, UPH, UTP, UGL, UKET, UBIL, UHGB, UNIT, UROB, ULEU, UEPI, UWBC, URBC, UBAC, CAST, CRYS, UCOM, BILUA,  in the last 72 hours  MICROBIOLOGY: Recent Results (from the past 240 hour(s))  CULTURE, BLOOD (ROUTINE X 2)     Status: None   Collection Time    06/20/13  4:53 PM      Result Value Ref Range Status   Specimen Description BLOOD HAND LEFT   Final   Special Requests BOTTLES DRAWN AEROBIC AND ANAEROBIC 5CC   Final   Culture  Setup Time     Final   Value: 06/20/2013 20:38     Performed at [facility]   Culture     Final   Value:        BLOOD CULTURE RECEIVED NO GROWTH TO DATE CULTURE WILL BE HELD FOR 5 DAYS BEFORE ISSUING A FINAL NEGATIVE REPORT     Performed at [facility]   Report Status PENDING   Incomplete  CULTURE, BLOOD (ROUTINE X 2)     Status: None   Collection Time    06/20/13  5:00 PM      Result Value Ref Range Status   Specimen Description BLOOD ARM LEFT   Final   Special Requests BOTTLES DRAWN AEROBIC ONLY 5CC   Final   Culture  Setup Time      Final   Value: 06/20/2013 20:38     Performed at [facility]     Final   Value:        BLOOD CULTURE RECEIVED NO GROWTH TO DATE CULTURE WILL BE HELD FOR 5 DAYS BEFORE ISSUING A FINAL NEGATIVE REPORT     Performed at [facility]   Report Status PENDING   Incomplete    RADIOLOGY STUDIES/RESULTS: Dg Chest 2 View  06/20/2013   CLINICAL DATA:  Shortness of breath  EXAM: CHEST  2 VIEW  COMPARISON:  06/17/2012  FINDINGS: Cardiac shadow is stable. Chronic changes are noted in the right lung base but slightly increased from the prior exam which may represent some acute on chronic infiltrate. Some patchy infiltrative changes noted in the left mid lung. Compression deformity is noted at the thoracolumbar junction which appears stable from the prior exam.  IMPRESSION: Bilateral infiltrative changes in the left mid lung and right base. The changes in the right base appear to be acute on chronic.   Electronically Signed   By: Inez Catalina M.D.   On: 06/20/2013 16:04    Oren Binet, MD  [facility] Pager:[phone]  If 7PM-7AM, please contact night-coverage [email or web address] Password [facility] 06/21/2013, 11:58 AM   LOS: 1 day

## 2013-06-22 ENCOUNTER — Inpatient Hospital Stay (HOSPITAL_COMMUNITY): Payer: Medicare HMO

## 2013-06-22 LAB — BASIC METABOLIC PANEL
BUN: 22 mg/dL (ref 6–23)
CHLORIDE: 111 meq/L (ref 96–112)
CO2: 20 mEq/L (ref 19–32)
Calcium: 8.1 mg/dL — ABNORMAL LOW (ref 8.4–10.5)
Creatinine, Ser: 1.23 mg/dL (ref 0.50–1.35)
GFR calc Af Amer: 59 mL/min — ABNORMAL LOW (ref >90)
GFR calc non Af Amer: 51 mL/min — ABNORMAL LOW (ref >90)
GLUCOSE: 143 mg/dL — AB (ref 70–99)
Potassium: 3.5 mEq/L — ABNORMAL LOW (ref 3.7–5.3)
Sodium: 143 mEq/L (ref 137–147)

## 2013-06-22 LAB — CBC
HEMATOCRIT: 32.3 % — AB (ref 39.0–52.0)
Hemoglobin: 10.6 g/dL — ABNORMAL LOW (ref 13.0–17.0)
MCH: 28.6 pg (ref 26.0–34.0)
MCHC: 32.8 g/dL (ref 30.0–36.0)
MCV: 87.1 fL (ref 78.0–100.0)
Platelets: 177 10*3/uL (ref 150–400)
RBC: 3.71 MIL/uL — ABNORMAL LOW (ref 4.22–5.81)
RDW: 14.9 % (ref 11.5–15.5)
WBC: 10 10*3/uL (ref 4.0–10.5)

## 2013-06-22 LAB — URINE CULTURE
Colony Count: NO GROWTH
Culture: NO GROWTH

## 2013-06-22 MED ORDER — DOXYCYCLINE HYCLATE 100 MG PO TABS
100.0000 mg | ORAL_TABLET | Freq: Two times a day (BID) | ORAL | Status: DC
  Administered 2013-06-23 – 2013-06-24 (×3): 100 mg via ORAL
  Filled 2013-06-22 (×4): qty 1

## 2013-06-22 NOTE — Procedures (Signed)
Objective Swallowing Evaluation: Modified Barium Swallowing Study  Patient Details  Name: [name] MRN: [id number] Date of Birth: Nov 05, 1922  Today's Date: 06/22/2013 Time: 1137-1206 SLP Time Calculation (min): 29 min  Past Medical History:  Past Medical History  Diagnosis Date  . COPD (chronic obstructive pulmonary disease)   . GERD (gastroesophageal reflux disease)   . Prostate cancer     "had prostate removed and had chemo" (02/09/2012)  . Depression   . DVT (deep venous thrombosis)   . Alzheimer's disease   . High cholesterol   . Asthma   . Hypertension   . Pneumonia     "4-5 times thru my life" (02/09/2012)  . Chronic bronchitis     "get it q year" (02/09/2012)  . Exertional dyspnea     "sometimes" (02/09/2012)  . History of blood transfusion     "when prostate removed" (02/09/2012)  . Arthritis     "in my fingers"  (02/09/2012)   Past Surgical History:  Past Surgical History  Procedure Laterality Date  . Tonsillectomy  ~ 1938  . Appendectomy  1950's?  . Cataract extraction w/ intraocular lens  implant, bilateral    . Prostatectomy    . Tear duct probing      "have had it done twice; left eye only?" (02/09/2012)  . Eye surgery     HPI:  [name]  is a 80 y.o. male, with history of dementia, COPD, remote history of DVT, hypertension, eyelid surgery in the past who lives at his home with his wife, apparently wife was diagnosed with pneumonia a few days ago, patient himself had nausea vomiting and diarrhea about 2 weeks ago which resolved, a few days ago he started experiencing a productive cough, some shortness of breath and fevers, his symptoms did not improve so family brought him to the ER where his workup was consistent with community-acquired pneumonia, acute renal failure and dehydration along with hyponatremia.     Assessment / Plan / Recommendation Clinical Impression  Dysphagia Diagnosis: Moderate oral phase dysphagia;Moderate pharyngeal phase  dysphagia Clinical impression: Pt presents with a moderate oral dysphagia with lingual weakness and decreased bolus propulsion with solids leading to mild residuals with liquids and moderate to severe residue with solid textures. Oropharyngeal phase also moderately impaired due to slight delay in swallow intiation evident with thin liquids which are silently penetrated and aspirated before the swallow in small amounts. There is also moderate weakness of hyolaryngeal mechanism leading to moderate residue, particularly in the valleculae. The pts function significantly improves with a chin tuck. Airway closure is adequate and there is less residue. Pt is best able to tuck chin with small straw sips. Unsure if pt will follow chin tuck strategy. If so, he may have thin liquids and puree. If not, nectar thick liquids with a second swallow is needed. SLP will f/u Monday. Full supervision until then.     Treatment Recommendation  Therapy as outlined in treatment plan below    Diet Recommendation Dysphagia 1 (Puree);Thin liquid   Liquid Administration via: Straw Medication Administration: Crushed with puree Supervision: Full supervision/cueing for compensatory strategies;Staff to assist with self feeding Compensations: Slow rate;Small sips/bites Postural Changes and/or Swallow Maneuvers: Chin tuck;Seated upright 90 degrees    Other  Recommendations Oral Care Recommendations: Oral care BID   Follow Up Recommendations  Skilled Nursing facility;24 hour supervision/assistance    Frequency and Duration min 2x/week  2 weeks   Pertinent Vitals/Pain NA    SLP Swallow Goals  General HPI: [name]  is a 80 y.o. male, with history of dementia, COPD, remote history of DVT, hypertension, eyelid surgery in the past who lives at his home with his wife, apparently wife was diagnosed with pneumonia a few days ago, patient himself had nausea vomiting and diarrhea about 2 weeks ago which resolved, a few days  ago he started experiencing a productive cough, some shortness of breath and fevers, his symptoms did not improve so family brought him to the ER where his workup was consistent with community-acquired pneumonia, acute renal failure and dehydration along with hyponatremia. Type of Study: Modified Barium Swallowing Study Reason for Referral: Objectively evaluate swallowing function Previous Swallow Assessment: 1 previous MBS in chart; unable to view, however wife denies the need for altered diets in the past Diet Prior to this Study: Dysphagia 1 (puree);Thin liquids Temperature Spikes Noted: No Respiratory Status: Nasal cannula History of Recent Intubation: No Behavior/Cognition: Alert;Cooperative;Pleasant mood;Requires cueing Oral Cavity - Dentition: Dentures, not available;Edentulous Oral Motor / Sensory Function: Impaired - see Bedside swallow eval Self-Feeding Abilities: Able to feed self;Needs assist Patient Positioning: Upright in chair Baseline Vocal Quality: Clear;Low vocal intensity Volitional Cough: Strong;Congested Volitional Swallow: Able to elicit Anatomy: Within functional limits Pharyngeal Secretions: Not observed secondary MBS    Reason for Referral Objectively evaluate swallowing function   Oral Phase Oral Preparation/Oral Phase Oral Phase: Impaired Oral - Nectar Oral - Nectar Cup: Lingual/palatal residue Oral - Thin Oral - Thin Cup: Lingual/palatal residue Oral - Thin Straw: Lingual/palatal residue Oral - Solids Oral - Puree: Lingual/palatal residue Oral - Mechanical Soft: Lingual/palatal residue;Delayed oral transit;Reduced posterior propulsion;Incomplete tongue to palate contact;Lingual pumping   Pharyngeal Phase Pharyngeal Phase Pharyngeal Phase: Impaired Pharyngeal - Nectar Pharyngeal - Nectar Cup: Reduced pharyngeal peristalsis;Reduced epiglottic inversion;Reduced anterior laryngeal mobility;Reduced laryngeal elevation;Reduced tongue base retraction;Reduced  airway/laryngeal closure;Pharyngeal residue - valleculae;Pharyngeal residue - pyriform sinuses Pharyngeal - Thin Pharyngeal - Thin Cup: Reduced pharyngeal peristalsis;Reduced epiglottic inversion;Reduced anterior laryngeal mobility;Reduced laryngeal elevation;Reduced tongue base retraction;Reduced airway/laryngeal closure;Pharyngeal residue - valleculae;Pharyngeal residue - pyriform sinuses;Delayed swallow initiation;Trace aspiration Pharyngeal - Thin Straw: Reduced pharyngeal peristalsis;Reduced epiglottic inversion;Reduced anterior laryngeal mobility;Reduced laryngeal elevation;Reduced tongue base retraction;Reduced airway/laryngeal closure;Pharyngeal residue - valleculae;Pharyngeal residue - pyriform sinuses;Delayed swallow initiation (improved with a chint uck) Pharyngeal - Solids Pharyngeal - Puree: Reduced pharyngeal peristalsis;Reduced epiglottic inversion;Reduced anterior laryngeal mobility;Reduced laryngeal elevation;Reduced tongue base retraction;Reduced airway/laryngeal closure;Pharyngeal residue - valleculae;Pharyngeal residue - pyriform sinuses Pharyngeal - Mechanical Soft: Reduced pharyngeal peristalsis;Reduced epiglottic inversion;Reduced anterior laryngeal mobility;Reduced laryngeal elevation;Reduced tongue base retraction;Reduced airway/laryngeal closure;Pharyngeal residue - valleculae;Pharyngeal residue - pyriform sinuses  Cervical Esophageal Phase    GO    Cervical Esophageal Phase Cervical Esophageal Phase:  (mild esophageal residual no radiologist present to confirm. )        Herbie Baltimore, MA CCC-SLP [phone]  Thelmer Legler, Katherene Ponto 06/22/2013, 12:24 PM

## 2013-06-22 NOTE — Clinical Social Work Placement (Signed)
Clinical Social Work Department CLINICAL SOCIAL WORK PLACEMENT NOTE 06/22/2013  Patient:  [name], [name]  Account Number:  [id number] Admit date:  06/20/2013  Clinical Social Worker:  Lovey Newcomer  Date/time:  06/22/2013 09:53 AM  Clinical Social Work is seeking post-discharge placement for this patient at the following level of care:   [facility]   (*CSW will update this form in Epic as items are completed)   06/21/2013  Patient/family provided with [place] Department of Clinical Social Work's list of facilities offering this level of care within the geographic area requested by the patient (or if unable, by the patient's family).  06/21/2013  Patient/family informed of their freedom to choose among providers that offer the needed level of care, that participate in Medicare, Medicaid or managed care program needed by the patient, have an available bed and are willing to accept the patient.  06/21/2013  Patient/family informed of [facility]' ownership interest in [facility], as well as of the fact that they are under no obligation to receive care at this facility.  PASARR submitted to [employer] on  PASARR number received from [place] on   FL2 transmitted to all facilities in geographic area requested by pt/family on  06/22/2013 FL2 transmitted to all facilities within larger geographic area on   Patient informed that his/her managed care company has contracts with or will negotiate with  certain facilities, including the following:     Patient/family informed of bed offers received:   Patient chooses bed at  Physician recommends and patient chooses bed at    Patient to be transferred to  on   Patient to be transferred to facility by   The following physician request were entered in Epic:   Additional Comments:   Liz Beach, [place], [place], [phone]

## 2013-06-22 NOTE — Progress Notes (Signed)
This patient is receiving the antibiotic(s) Doxycycline by the intravenous route. Based on criteria approved by the Pharmacy and Therapeutics Committee, and the Infectious Disease Division, the antibiotic(s) is / are being converted to equivalent oral dose form(s). These criteria include:  . Patient being treated for a respiratory tract infection, urinary tract infection, cellulitis, or Clostridium Difficile Associated Diarrhea . The patient is not neutropenic and does not exhibit a GI malabsorption state . The patient is eating (either orally or per tube) and/or has been taking other orally administered medications for at least 24 hours. . The patient is improving clinically (physician assessment and a 24-hour Tmax of ? 100.5? F).  If you have questions about this conversion, please contact the pharmacy department. Thank you.  Albertina Parr, PharmD.  Clinical Pharmacist Pager [phone]

## 2013-06-22 NOTE — Progress Notes (Signed)
PATIENT DETAILS Name: [name] Age: 80 y.o. Sex: male Date of Birth: December 02, 1922 Admit Date: 06/20/2013 Admitting Physician Thurnell Lose, MD [id number], Jaymes Graff, MD  Subjective: No major events overnight  Assessment/Plan: Community acquired pneumonia - Afebrile, leukocytosis has resolved. Clinically improved and nontoxic looking.  - Continue with Rocephin and doxycycline-day 3 - Influenza PCR negative - Blood culture 2/12-negative - Streptococcal and Legionella urinary antigen negative  Dehydration - Clinically euvolemic- will stop IV fluids today  Hypernatremia - Resolved with half normal saline. Check electrolytes periodically.  Acute renal failure - Likely prerenal in etiology - Resolved with IV fluids  Dysphagia - Seen by speech therapy-subsequently modified barium swallow done on 2/14-current recommendations are for a dysphagia 1 diet. Currently unable to reach family to discuss this issue. We'll readdress later.  COPD - No evidence of exacerbation-lungs clear. - Albuterol nebulizer as needed, continue Symbicort  Hypertension - Controlled with amlodipine  Dementia with delirium - Expect sundowning to get worse at night - Continue with Zoloft, mirtazapine and galantamine.  Disposition: Remain inpatient-?SNF on discharge  DVT Prophylaxis: Prophylactic Lovenox   Code Status: DNR confirmed with Spouse over the phone on 2/13  Family Communication Spouse over the phone on 2/13 Attempted to reach daughter at [phone]  Procedures:  None  CONSULTS:  None  Time spent 40 minutes-which includes 50% of the time with face-to-face with patient/ family and coordinating care related to the above assessment and plan.  MEDICATIONS: Scheduled Meds: . amLODipine  2.5 mg Oral Daily  . antiseptic oral rinse  15 mL Mouth Rinse BID  . aspirin  81 mg Oral Daily  . budesonide-formoterol  2 puff Inhalation BID  . cefTRIAXone (ROCEPHIN)   IV  1 g Intravenous Q24H  . cholecalciferol  1,000 Units Oral Daily  . doxycycline (VIBRAMYCIN) IV  100 mg Intravenous Q12H  . feeding supplement (ENSURE COMPLETE)  237 mL Oral TID BM  . galantamine  12 mg Oral BID  . heparin  5,000 Units Subcutaneous 3 times per day  . mirtazapine  30 mg Oral QHS  . multivitamin-lutein  1 capsule Oral Daily  . oxybutynin  5 mg Oral BID  . pantoprazole  40 mg Oral Daily  . sertraline  100 mg Oral Daily   Continuous Infusions: . sodium chloride 40 mL/hr (06/22/13 0815)   PRN Meds:.albuterol, alum & mag hydroxide-simeth, guaiFENesin-dextromethorphan, HYDROcodone-acetaminophen, ondansetron (ZOFRAN) IV, ondansetron, polyethylene glycol, polyvinyl alcohol  Antibiotics: Anti-infectives   Start     Dose/Rate Route Frequency Ordered Stop   06/21/13 1600  doxycycline (VIBRAMYCIN) 100 mg in dextrose 5 % 250 mL IVPB     100 mg 125 mL/hr over 120 Minutes Intravenous Every 12 hours 06/21/13 1533     06/21/13 1000  cefTRIAXone (ROCEPHIN) 1 g in dextrose 5 % 50 mL IVPB     1 g 100 mL/hr over 30 Minutes Intravenous Every 24 hours 06/20/13 1756 06/28/13 0959   06/21/13 1000  oseltamivir (TAMIFLU) capsule 30 mg  Status:  Discontinued     30 mg Oral Daily 06/21/13 0850 06/21/13 0918   06/20/13 2200  doxycycline (VIBRA-TABS) tablet 100 mg  Status:  Discontinued     100 mg Oral Every 12 hours 06/20/13 1627 06/20/13 1628   06/20/13 2200  oseltamivir (TAMIFLU) capsule 75 mg  Status:  Discontinued     75 mg Oral 2 times daily 06/20/13 1628 06/21/13 0850   06/20/13 1630  azithromycin (ZITHROMAX)  500 mg in dextrose 5 % 250 mL IVPB  Status:  Discontinued     500 mg 250 mL/hr over 60 Minutes Intravenous  Once 06/20/13 1618 06/20/13 1628   06/20/13 1630  cefTRIAXone (ROCEPHIN) 1 g in dextrose 5 % 50 mL IVPB     1 g 100 mL/hr over 30 Minutes Intravenous  Once 06/20/13 1618 06/20/13 1704   06/20/13 1630  cefTRIAXone (ROCEPHIN) 1 g in dextrose 5 % 50 mL IVPB  Status:   Discontinued     1 g 100 mL/hr over 30 Minutes Intravenous Every 24 hours 06/20/13 1627 06/20/13 1755   06/20/13 1630  doxycycline (VIBRA-TABS) tablet 100 mg  Status:  Discontinued     100 mg Oral  Once 06/20/13 1628 06/20/13 1628   06/20/13 1630  doxycycline (VIBRAMYCIN) 100 mg in dextrose 5 % 250 mL IVPB  Status:  Discontinued     100 mg 125 mL/hr over 120 Minutes Intravenous  Once 06/20/13 1628 06/21/13 1533       PHYSICAL EXAM: Vital signs in last 24 hours: Filed Vitals:   06/22/13 0525 06/22/13 0858 06/22/13 1003 06/22/13 1315  BP: 134/66  130/72 114/51  Pulse: 72  71 65  Temp: 98.3 F (36.8 C)   98.3 F (36.8 C)  TempSrc: Oral   Oral  Resp: 15     Height:      Weight:    58.2 kg (128 lb 4.9 oz)  SpO2: 97% 97%  96%    Weight change:  Filed Weights   06/20/13 2209 06/21/13 0416 06/22/13 1315  Weight: 55.6 kg (122 lb 9.2 oz) 56.8 kg (125 lb 3.5 oz) 58.2 kg (128 lb 4.9 oz)   Body mass index is 19.79 kg/(m^2).   Gen Exam: Awake,pleasantly confused,clear speech.   Neck: Supple, No JVD.   Chest: B/L Clear-few bibasilar rales. No rhonchi CVS: S1 S2 Regular, no murmurs.  Abdomen: soft, BS +, non tender, non distended.  Extremities: no edema, lower extremities warm to touch. Neurologic: Non Focal.   Skin: No Rash.  Wounds: N/A.    Intake/Output from previous day:  Intake/Output Summary (Last 24 hours) at 06/22/13 1342 Last data filed at 06/22/13 0532  Gross per 24 hour  Intake   1560 ml  Output    525 ml  Net   1035 ml     LAB RESULTS: CBC  Recent Labs Lab 06/20/13 1446 06/20/13 1626 06/21/13 0628 06/22/13 0550  WBC 19.5* 16.5* 15.4* 10.0  HGB 13.9 13.4 11.3* 10.6*  HCT 41.2 39.4 34.8* 32.3*  PLT 270 235 201 177  MCV 86.7 86.8 87.0 87.1  MCH 29.3 29.5 28.3 28.6  MCHC 33.7 34.0 32.5 32.8  RDW 14.7 14.9 14.8 14.9  LYMPHSABS 1.9  --   --   --   MONOABS 1.9*  --   --   --   EOSABS 0.0  --   --   --   BASOSABS 0.0  --   --   --     Chemistries    Recent Labs Lab 06/20/13 1446 06/20/13 1626 06/21/13 0628 06/22/13 0550  NA 150*  --  149* 143  K 4.1  --  4.5 3.5*  CL 110  --  117* 111  CO2 20  --  22 20  GLUCOSE 131*  --  124* 143*  BUN 41*  --  30* 22  CREATININE 1.53* 1.43* 1.50* 1.23  CALCIUM 9.0  --  8.3* 8.1*  CBG: No results found for this basename: GLUCAP,  in the last 168 hours  GFR Estimated Creatinine Clearance: 34.2 ml/min (by C-G formula based on Cr of 1.23).  Coagulation profile No results found for this basename: INR, PROTIME,  in the last 168 hours  Cardiac Enzymes  Recent Labs Lab 06/20/13 1446  TROPONINI <0.30    No components found with this basename: POCBNP,  No results found for this basename: DDIMER,  in the last 72 hours No results found for this basename: HGBA1C,  in the last 72 hours No results found for this basename: CHOL, HDL, LDLCALC, TRIG, CHOLHDL, LDLDIRECT,  in the last 72 hours No results found for this basename: TSH, T4TOTAL, FREET3, T3FREE, THYROIDAB,  in the last 72 hours No results found for this basename: VITAMINB12, FOLATE, FERRITIN, TIBC, IRON, RETICCTPCT,  in the last 72 hours No results found for this basename: LIPASE, AMYLASE,  in the last 72 hours  Urine Studies No results found for this basename: UACOL, UAPR, USPG, UPH, UTP, UGL, UKET, UBIL, UHGB, UNIT, UROB, ULEU, UEPI, UWBC, URBC, UBAC, CAST, CRYS, UCOM, BILUA,  in the last 72 hours  MICROBIOLOGY: Recent Results (from the past 240 hour(s))  CULTURE, BLOOD (ROUTINE X 2)     Status: None   Collection Time    06/20/13  4:53 PM      Result Value Ref Range Status   Specimen Description BLOOD HAND LEFT   Final   Special Requests BOTTLES DRAWN AEROBIC AND ANAEROBIC 5CC   Final   Culture  Setup Time     Final   Value: 06/20/2013 20:38     Performed at [facility]   Culture     Final   Value:        BLOOD CULTURE RECEIVED NO GROWTH TO DATE CULTURE WILL BE HELD FOR 5 DAYS BEFORE ISSUING A FINAL NEGATIVE  REPORT     Performed at [facility]   Report Status PENDING   Incomplete  CULTURE, BLOOD (ROUTINE X 2)     Status: None   Collection Time    06/20/13  5:00 PM      Result Value Ref Range Status   Specimen Description BLOOD ARM LEFT   Final   Special Requests BOTTLES DRAWN AEROBIC ONLY 5CC   Final   Culture  Setup Time     Final   Value: 06/20/2013 20:38     Performed at [facility]   Culture     Final   Value:        BLOOD CULTURE RECEIVED NO GROWTH TO DATE CULTURE WILL BE HELD FOR 5 DAYS BEFORE ISSUING A FINAL NEGATIVE REPORT     Performed at [facility]   Report Status PENDING   Incomplete  URINE CULTURE     Status: None   Collection Time    06/21/13  5:55 AM      Result Value Ref Range Status   Specimen Description URINE, CLEAN CATCH   Final   Special Requests NONE   Final   Culture  Setup Time     Final   Value: 06/21/2013 11:35     Performed at [facility]     Final   Value: NO GROWTH     Performed at [facility]   Culture     Final   Value: NO GROWTH     Performed at [facility]   Report Status  06/22/2013 FINAL   Final    RADIOLOGY STUDIES/RESULTS: Dg Chest 2 View  06/20/2013   CLINICAL DATA:  Shortness of breath  EXAM: CHEST  2 VIEW  COMPARISON:  06/17/2012  FINDINGS: Cardiac shadow is stable. Chronic changes are noted in the right lung base but slightly increased from the prior exam which may represent some acute on chronic infiltrate. Some patchy infiltrative changes noted in the left mid lung. Compression deformity is noted at the thoracolumbar junction which appears stable from the prior exam.  IMPRESSION: Bilateral infiltrative changes in the left mid lung and right base. The changes in the right base appear to be acute on chronic.   Electronically Signed   By: Inez Catalina M.D.   On: 06/20/2013 16:04    Oren Binet, MD  [facility] Pager:[phone]  If 7PM-7AM, please contact  night-coverage [email or web address] Password [email or web address] 06/22/2013, 1:42 PM   LOS: 2 days

## 2013-06-23 NOTE — Progress Notes (Deleted)
Per RN-patient had some mild bright red blood per rectum earlier this evening (one episode)-will hold heparin overnight. Repeat CBC in am. INR today is 1.8.    

## 2013-06-23 NOTE — Progress Notes (Addendum)
PATIENT DETAILS Name: [name] Age: 80 y.o. Sex: male Date of Birth: 04-Oct-1922 Admit Date: 06/20/2013 Admitting Physician Thurnell Lose, MD [id number], Jaymes Graff, MD  Subjective: No major events overnight  Assessment/Plan: Community acquired pneumonia - Afebrile, leukocytosis has resolved. Clinically improved and nontoxic looking.  - Continue with Rocephin and doxycycline-day 4 - Influenza PCR negative - Blood culture 2/12-negative - Streptococcal and Legionella urinary antigen negative  Dehydration - Clinically euvolemic- will stop IV fluids today  Hypernatremia - Resolved with half normal saline. Check electrolytes periodically.  Acute renal failure - Likely prerenal in etiology - Resolved with IV fluids  Dysphagia - Seen by speech therapy-subsequently modified barium swallow done on 2/14-current recommendations are for a dysphagia 1 diet. Spoke to spouse on 2/15-agreeable to accept all risks and continue with Dys 1 diet.  COPD - No evidence of exacerbation-lungs clear. - Albuterol nebulizer as needed, continue Symbicort  Hypertension - Controlled with amlodipine  Dementia with delirium - Expect sundowning to get worse at night - Continue with Zoloft, mirtazapine and galantamine.  Disposition: Remain inpatient-SNF on discharge-spouse agreeable with SNF. Discharge on 2/16 if clinical improvement continues  DVT Prophylaxis: Prophylactic Lovenox   Code Status: DNR confirmed with Spouse over the phone on 2/13  Family Communication Spouse over the phone on 2/13 and 2/15 Attempted to reach daughter at [phone]  Procedures:  None  CONSULTS:  None  MEDICATIONS: Scheduled Meds: . amLODipine  2.5 mg Oral Daily  . antiseptic oral rinse  15 mL Mouth Rinse BID  . aspirin  81 mg Oral Daily  . budesonide-formoterol  2 puff Inhalation BID  . cefTRIAXone (ROCEPHIN)  IV  1 g Intravenous Q24H  . cholecalciferol  1,000 Units Oral  Daily  . doxycycline  100 mg Oral Q12H  . feeding supplement (ENSURE COMPLETE)  237 mL Oral TID BM  . galantamine  12 mg Oral BID  . heparin  5,000 Units Subcutaneous 3 times per day  . mirtazapine  30 mg Oral QHS  . multivitamin-lutein  1 capsule Oral Daily  . oxybutynin  5 mg Oral BID  . pantoprazole  40 mg Oral Daily  . sertraline  100 mg Oral Daily   Continuous Infusions: . sodium chloride 20 mL/hr (06/23/13 1025)   PRN Meds:.albuterol, alum & mag hydroxide-simeth, guaiFENesin-dextromethorphan, HYDROcodone-acetaminophen, ondansetron (ZOFRAN) IV, ondansetron, polyethylene glycol, polyvinyl alcohol  Antibiotics: Anti-infectives   Start     Dose/Rate Route Frequency Ordered Stop   06/23/13 1000  doxycycline (VIBRA-TABS) tablet 100 mg     100 mg Oral Every 12 hours 06/22/13 1600     06/21/13 1600  doxycycline (VIBRAMYCIN) 100 mg in dextrose 5 % 250 mL IVPB     100 mg 125 mL/hr over 120 Minutes Intravenous Every 12 hours 06/21/13 1533 06/22/13 1717   06/21/13 1000  cefTRIAXone (ROCEPHIN) 1 g in dextrose 5 % 50 mL IVPB     1 g 100 mL/hr over 30 Minutes Intravenous Every 24 hours 06/20/13 1756 06/28/13 0959   06/21/13 1000  oseltamivir (TAMIFLU) capsule 30 mg  Status:  Discontinued     30 mg Oral Daily 06/21/13 0850 06/21/13 0918   06/20/13 2200  doxycycline (VIBRA-TABS) tablet 100 mg  Status:  Discontinued     100 mg Oral Every 12 hours 06/20/13 1627 06/20/13 1628   06/20/13 2200  oseltamivir (TAMIFLU) capsule 75 mg  Status:  Discontinued     75 mg Oral 2 times  daily 06/20/13 1628 06/21/13 0850   06/20/13 1630  azithromycin (ZITHROMAX) 500 mg in dextrose 5 % 250 mL IVPB  Status:  Discontinued     500 mg 250 mL/hr over 60 Minutes Intravenous  Once 06/20/13 1618 06/20/13 1628   06/20/13 1630  cefTRIAXone (ROCEPHIN) 1 g in dextrose 5 % 50 mL IVPB     1 g 100 mL/hr over 30 Minutes Intravenous  Once 06/20/13 1618 06/20/13 1704   06/20/13 1630  cefTRIAXone (ROCEPHIN) 1 g in dextrose 5  % 50 mL IVPB  Status:  Discontinued     1 g 100 mL/hr over 30 Minutes Intravenous Every 24 hours 06/20/13 1627 06/20/13 1755   06/20/13 1630  doxycycline (VIBRA-TABS) tablet 100 mg  Status:  Discontinued     100 mg Oral  Once 06/20/13 1628 06/20/13 1628   06/20/13 1630  doxycycline (VIBRAMYCIN) 100 mg in dextrose 5 % 250 mL IVPB  Status:  Discontinued     100 mg 125 mL/hr over 120 Minutes Intravenous  Once 06/20/13 1628 06/21/13 1533       PHYSICAL EXAM: Vital signs in last 24 hours: Filed Vitals:   06/23/13 0500 06/23/13 0600 06/23/13 0911 06/23/13 1029  BP:  145/68  146/71  Pulse:  65    Temp:  98.3 F (36.8 C)    TempSrc:  Oral    Resp:  18    Height:      Weight: 58.8 kg (129 lb 10.1 oz)     SpO2:  93% 94%     Weight change:  Filed Weights   06/21/13 0416 06/22/13 1315 06/23/13 0500  Weight: 56.8 kg (125 lb 3.5 oz) 58.2 kg (128 lb 4.9 oz) 58.8 kg (129 lb 10.1 oz)   Body mass index is 19.99 kg/(m^2).   Gen Exam: Awake,pleasantly confused,clear speech.   Neck: Supple, No JVD.   Chest: B/L Clear-few bibasilar rales. No rhonchi or rales CVS: S1 S2 Regular, no murmurs.  Abdomen: soft, BS +, non tender, non distended.  Extremities: no edema, lower extremities warm to touch. Neurologic: Non Focal.   Skin: No Rash.  Wounds: N/A.    Intake/Output from previous day:  Intake/Output Summary (Last 24 hours) at 06/23/13 1045 Last data filed at 06/22/13 1910  Gross per 24 hour  Intake    340 ml  Output      0 ml  Net    340 ml     LAB RESULTS: CBC  Recent Labs Lab 06/20/13 1446 06/20/13 1626 06/21/13 0628 06/22/13 0550  WBC 19.5* 16.5* 15.4* 10.0  HGB 13.9 13.4 11.3* 10.6*  HCT 41.2 39.4 34.8* 32.3*  PLT 270 235 201 177  MCV 86.7 86.8 87.0 87.1  MCH 29.3 29.5 28.3 28.6  MCHC 33.7 34.0 32.5 32.8  RDW 14.7 14.9 14.8 14.9  LYMPHSABS 1.9  --   --   --   MONOABS 1.9*  --   --   --   EOSABS 0.0  --   --   --   BASOSABS 0.0  --   --   --     Chemistries    Recent Labs Lab 06/20/13 1446 06/20/13 1626 06/21/13 0628 06/22/13 0550  NA 150*  --  149* 143  K 4.1  --  4.5 3.5*  CL 110  --  117* 111  CO2 20  --  22 20  GLUCOSE 131*  --  124* 143*  BUN 41*  --  30* 22  CREATININE  1.53* 1.43* 1.50* 1.23  CALCIUM 9.0  --  8.3* 8.1*    CBG: No results found for this basename: GLUCAP,  in the last 168 hours  GFR Estimated Creatinine Clearance: 34.5 ml/min (by C-G formula based on Cr of 1.23).  Coagulation profile No results found for this basename: INR, PROTIME,  in the last 168 hours  Cardiac Enzymes  Recent Labs Lab 06/20/13 1446  TROPONINI <0.30    No components found with this basename: POCBNP,  No results found for this basename: DDIMER,  in the last 72 hours No results found for this basename: HGBA1C,  in the last 72 hours No results found for this basename: CHOL, HDL, LDLCALC, TRIG, CHOLHDL, LDLDIRECT,  in the last 72 hours No results found for this basename: TSH, T4TOTAL, FREET3, T3FREE, THYROIDAB,  in the last 72 hours No results found for this basename: VITAMINB12, FOLATE, FERRITIN, TIBC, IRON, RETICCTPCT,  in the last 72 hours No results found for this basename: LIPASE, AMYLASE,  in the last 72 hours  Urine Studies No results found for this basename: UACOL, UAPR, USPG, UPH, UTP, UGL, UKET, UBIL, UHGB, UNIT, UROB, ULEU, UEPI, UWBC, URBC, UBAC, CAST, CRYS, UCOM, BILUA,  in the last 72 hours  MICROBIOLOGY: Recent Results (from the past 240 hour(s))  CULTURE, BLOOD (ROUTINE X 2)     Status: None   Collection Time    06/20/13  4:53 PM      Result Value Ref Range Status   Specimen Description BLOOD HAND LEFT   Final   Special Requests BOTTLES DRAWN AEROBIC AND ANAEROBIC 5CC   Final   Culture  Setup Time     Final   Value: 06/20/2013 20:38     Performed at [facility]   Culture     Final   Value:        BLOOD CULTURE RECEIVED NO GROWTH TO DATE CULTURE WILL BE HELD FOR 5 DAYS BEFORE ISSUING A FINAL NEGATIVE  REPORT     Performed at [facility]   Report Status PENDING   Incomplete  CULTURE, BLOOD (ROUTINE X 2)     Status: None   Collection Time    06/20/13  5:00 PM      Result Value Ref Range Status   Specimen Description BLOOD ARM LEFT   Final   Special Requests BOTTLES DRAWN AEROBIC ONLY 5CC   Final   Culture  Setup Time     Final   Value: 06/20/2013 20:38     Performed at [facility]   Culture     Final   Value:        BLOOD CULTURE RECEIVED NO GROWTH TO DATE CULTURE WILL BE HELD FOR 5 DAYS BEFORE ISSUING A FINAL NEGATIVE REPORT     Performed at [facility]   Report Status PENDING   Incomplete  URINE CULTURE     Status: None   Collection Time    06/21/13  5:55 AM      Result Value Ref Range Status   Specimen Description URINE, CLEAN CATCH   Final   Special Requests NONE   Final   Culture  Setup Time     Final   Value: 06/21/2013 11:35     Performed at [facility]     Final   Value: NO GROWTH     Performed at [facility]   Culture     Final   Value:  NO GROWTH     Performed at [facility]   Report Status 06/22/2013 FINAL   Final    RADIOLOGY STUDIES/RESULTS: Dg Chest 2 View  06/20/2013   CLINICAL DATA:  Shortness of breath  EXAM: CHEST  2 VIEW  COMPARISON:  06/17/2012  FINDINGS: Cardiac shadow is stable. Chronic changes are noted in the right lung base but slightly increased from the prior exam which may represent some acute on chronic infiltrate. Some patchy infiltrative changes noted in the left mid lung. Compression deformity is noted at the thoracolumbar junction which appears stable from the prior exam.  IMPRESSION: Bilateral infiltrative changes in the left mid lung and right base. The changes in the right base appear to be acute on chronic.   Electronically Signed   By: Inez Catalina M.D.   On: 06/20/2013 16:04    Oren Binet, MD  [facility] Pager:[phone]  If 7PM-7AM, please contact  night-coverage [email or web address] Password [email or web address] 06/23/2013, 10:45 AM   LOS: 3 days

## 2013-06-24 MED ORDER — MOXIFLOXACIN HCL 400 MG PO TABS: 400.0000 mg | ORAL_TABLET | Freq: Every day | ORAL | Status: DC

## 2013-06-24 MED ORDER — POLYETHYLENE GLYCOL 3350 17 G PO PACK: 17.0000 g | PACK | Freq: Every day | ORAL | Status: DC | PRN

## 2013-06-24 MED ORDER — GUAIFENESIN-DM 100-10 MG/5ML PO SYRP: 5.0000 mL | ORAL_SOLUTION | ORAL | Status: DC | PRN

## 2013-06-24 NOTE — Progress Notes (Signed)
Speech Language Pathology Treatment: Dysphagia  Patient Details Name: [name] MRN: [id number] DOB: Jun 27, 1922 Today's Date: 06/24/2013 Time: 1210-1223 SLP Time Calculation (min): 13 min  Assessment / Plan / Recommendation Clinical Impression  Therapy session focused on reinforcing chin tuck. Pt is able to complete with assist. Depends on level of supervision available at SNF whether this will be a successful strategy for pt. SLP f/u at that setting needed.    HPI HPI: [name]  is a 80 y.o. male, with history of dementia, COPD, remote history of DVT, hypertension, eyelid surgery in the past who lives at his home with his wife, apparently wife was diagnosed with pneumonia a few days ago, patient himself had nausea vomiting and diarrhea about 2 weeks ago which resolved, a few days ago he started experiencing a productive cough, some shortness of breath and fevers, his symptoms did not improve so family brought him to the ER where his workup was consistent with community-acquired pneumonia, acute renal failure and dehydration along with hyponatremia.   Pertinent Vitals NA  SLP Plan  Continue with current plan of care    Recommendations Diet recommendations: Dysphagia 1 (puree);Thin liquid Liquids provided via: Straw Medication Administration: Crushed with puree Supervision: Full supervision/cueing for compensatory strategies;Staff to assist with self feeding Compensations: Slow rate;Small sips/bites Postural Changes and/or Swallow Maneuvers: Chin tuck;Seated upright 90 degrees              Oral Care Recommendations: Oral care BID Follow up Recommendations: Skilled Nursing facility;24 hour supervision/assistance Plan: Continue with current plan of care    GO    [facility], MA CCC-SLP [phone]  Lynann Beaver 06/24/2013, 1:56 PM

## 2013-06-24 NOTE — Discharge Summary (Signed)
PATIENT DETAILS Name: [name] Age: 80 y.o. Sex: male Date of Birth: 1922-10-12 MRN: [id number]. Admit Date: 06/20/2013 Admitting Physician: Thurnell Lose, MD [id number], Jaymes Graff, MD  Recommendations for Outpatient Follow-up:  1. Speech therapy followup while at skilled nursing facility 2. Repeat chest x-ray in 6-8 weeks  PRIMARY DISCHARGE DIAGNOSIS:  Active Problems:   Cough   HTN (hypertension)   COPD (chronic obstructive pulmonary disease)   Dementia   Leukocytosis   CAP (community acquired pneumonia)   ARF (acute renal failure)   Dehydration   Dysphagia      PAST MEDICAL HISTORY: Past Medical History  Diagnosis Date  . COPD (chronic obstructive pulmonary disease)   . GERD (gastroesophageal reflux disease)   . Prostate cancer     "had prostate removed and had chemo" (02/09/2012)  . Depression   . DVT (deep venous thrombosis)   . Alzheimer's disease   . High cholesterol   . Asthma   . Hypertension   . Pneumonia     "4-5 times thru my life" (02/09/2012)  . Chronic bronchitis     "get it q year" (02/09/2012)  . Exertional dyspnea     "sometimes" (02/09/2012)  . History of blood transfusion     "when prostate removed" (02/09/2012)  . Arthritis     "in my fingers"  (02/09/2012)    DISCHARGE MEDICATIONS:   Medication List         albuterol (5 MG/ML) 0.5% nebulizer solution  Commonly known as:  PROVENTIL  Take 0.5 mLs (2.5 mg total) by nebulization every 2 (two) hours as needed for wheezing.     albuterol 108 (90 BASE) MCG/ACT inhaler  Commonly known as:  PROVENTIL HFA;VENTOLIN HFA  Inhale 2 puffs into the lungs every 6 (six) hours as needed.     amLODipine 5 MG tablet  Commonly known as:  NORVASC  Take 2.5 mg by mouth daily.     aspirin 81 MG chewable tablet  Chew 81 mg by mouth daily.     budesonide-formoterol 80-4.5 MCG/ACT inhaler  Commonly known as:  SYMBICORT  Inhale 2 puffs into the lungs 2 (two) times daily.     cholecalciferol 1000  UNITS tablet  Commonly known as:  VITAMIN D  Take 1,000 Units by mouth daily.     feeding supplement (ENSURE COMPLETE) Liqd  Take 237 mLs by mouth 3 (three) times daily between meals.     galantamine 12 MG tablet  Commonly known as:  RAZADYNE  Take 12 mg by mouth 2 (two) times daily.     guaiFENesin-dextromethorphan 100-10 MG/5ML syrup  Commonly known as:  ROBITUSSIN DM  Take 5 mLs by mouth every 4 (four) hours as needed for cough.     mirtazapine 15 MG tablet  Commonly known as:  REMERON  Take 30 mg by mouth at bedtime.     moxifloxacin 400 MG tablet  Commonly known as:  AVELOX  Take 1 tablet (400 mg total) by mouth daily at 8 pm. For 3 more days from 06/24/48     multivitamin-lutein Caps capsule  Take 1 capsule by mouth daily.     omeprazole 20 MG capsule  Commonly known as:  PRILOSEC  Take 20 mg by mouth daily.     ondansetron 4 MG tablet  Commonly known as:  ZOFRAN  Take 4 mg by mouth 2 (two) times daily as needed for nausea or vomiting.     oxybutynin 5 MG tablet  Commonly known  as:  DITROPAN  Take 5 mg by mouth 2 (two) times daily.     polyethylene glycol packet  Commonly known as:  MIRALAX / GLYCOLAX  Take 17 g by mouth daily as needed for mild constipation.     polyvinyl alcohol 1.4 % ophthalmic solution  Commonly known as:  LIQUIFILM TEARS  Place 1 drop into both eyes as needed for dry eyes.     sertraline 100 MG tablet  Commonly known as:  ZOLOFT  Take 100 mg by mouth daily.     triamcinolone 0.025 % cream  Commonly known as:  KENALOG  Apply topically 2 (two) times daily.        ALLERGIES:   Allergies  Allergen Reactions  . Eggs Or Egg-Derived Products Nausea And Vomiting and Rash  . Latex Rash  . Peanuts [Peanut Oil] Anaphylaxis and Swelling    "swelling of the face"  . Penicillins Rash  . Erythromycin     "I don't remember"  . Fish Allergy     "I never eat fish except for halibut"  . Sulfa Antibiotics Other (See Comments)    Reaction  unknown    BRIEF HPI:  See H&P, Labs, Consult and Test reports for all details in brief, [name] is a 80 y.o. male, with history of dementia, COPD, remote history of DVT, hypertension, eyelid surgery in the past who lives at his home with his wife, apparently wife was diagnosed with pneumonia a few days prior to this admission who presented to the hospital with nausea, vomiting, cough and shortness of breath. He was also febrile. He was then admitted for further evaluation and treatment. Chest x-ray on admission was consistent with pneumonia.  CONSULTATIONS:   None  PERTINENT RADIOLOGIC STUDIES: Dg Chest 2 View  06/20/2013   CLINICAL DATA:  Shortness of breath  EXAM: CHEST  2 VIEW  COMPARISON:  06/17/2012  FINDINGS: Cardiac shadow is stable. Chronic changes are noted in the right lung base but slightly increased from the prior exam which may represent some acute on chronic infiltrate. Some patchy infiltrative changes noted in the left mid lung. Compression deformity is noted at the thoracolumbar junction which appears stable from the prior exam.  IMPRESSION: Bilateral infiltrative changes in the left mid lung and right base. The changes in the right base appear to be acute on chronic.   Electronically Signed   By: Inez Catalina M.D.   On: 06/20/2013 16:04   Dg Swallowing Func-speech Pathology  06/22/2013   Katherene Ponto Deblois, CCC-SLP     06/22/2013 12:26 PM Objective Swallowing Evaluation: Modified Barium Swallowing Study   Patient Details  Name: [name] MRN: [id number] Date of Birth: 13-May-1922  Today's Date: 06/22/2013 Time: 1137-1206 SLP Time Calculation (min): 29 min  Past Medical History:  Past Medical History  Diagnosis Date  . COPD (chronic obstructive pulmonary disease)   . GERD (gastroesophageal reflux disease)   . Prostate cancer     "had prostate removed and had chemo" (02/09/2012)  . Depression   . DVT (deep venous thrombosis)   . Alzheimer's disease   . High cholesterol   .  Asthma   . Hypertension   . Pneumonia     "4-5 times thru my life" (02/09/2012)  . Chronic bronchitis     "get it q year" (02/09/2012)  . Exertional dyspnea     "sometimes" (02/09/2012)  . History of blood transfusion     "when prostate removed" (02/09/2012)  . Arthritis     "  in my fingers"  (02/09/2012)   Past Surgical History:  Past Surgical History  Procedure Laterality Date  . Tonsillectomy  ~ 1938  . Appendectomy  1950's?  . Cataract extraction w/ intraocular lens  implant, bilateral    . Prostatectomy    . Tear duct probing      "have had it done twice; left eye only?" (02/09/2012)  . Eye surgery     HPI:  [name]  is a 80 y.o. male, with history of dementia, COPD,  remote history of DVT, hypertension, eyelid surgery in the past  who lives at his home with his wife, apparently wife was  diagnosed with pneumonia a few days ago, patient himself had  nausea vomiting and diarrhea about 2 weeks ago which resolved, a  few days ago he started experiencing a productive cough, some  shortness of breath and fevers, his symptoms did not improve so  family brought him to the ER where his workup was consistent with  community-acquired pneumonia, acute renal failure and dehydration  along with hyponatremia.     Assessment / Plan / Recommendation Clinical Impression  Dysphagia Diagnosis: Moderate oral phase dysphagia;Moderate  pharyngeal phase dysphagia Clinical impression: Pt presents with a moderate oral dysphagia  with lingual weakness and decreased bolus propulsion with solids  leading to mild residuals with liquids and moderate to severe  residue with solid textures. Oropharyngeal phase also moderately  impaired due to slight delay in swallow intiation evident with  thin liquids which are silently penetrated and aspirated before  the swallow in small amounts. There is also moderate weakness of  hyolaryngeal mechanism leading to moderate residue, particularly  in the valleculae. The pts function significantly improves  with a  chin tuck. Airway closure is adequate and there is less residue.  Pt is best able to tuck chin with small straw sips. Unsure if pt  will follow chin tuck strategy. If so, he may have thin liquids  and puree. If not, nectar thick liquids with a second swallow is  needed. SLP will f/u Monday. Full supervision until then.     Treatment Recommendation  Therapy as outlined in treatment plan below    Diet Recommendation Dysphagia 1 (Puree);Thin liquid   Liquid Administration via: Straw Medication Administration: Crushed with puree Supervision: Full supervision/cueing for compensatory  strategies;Staff to assist with self feeding Compensations: Slow rate;Small sips/bites Postural Changes and/or Swallow Maneuvers: Chin tuck;Seated  upright 90 degrees    Other  Recommendations Oral Care Recommendations: Oral care BID   Follow Up Recommendations  Skilled Nursing facility;24 hour supervision/assistance    Frequency and Duration min 2x/week  2 weeks   Pertinent Vitals/Pain NA    SLP Swallow Goals     General HPI: [name]  is a 80 y.o. male, with history of  dementia, COPD, remote history of DVT, hypertension, eyelid  surgery in the past who lives at his home with his wife,  apparently wife was diagnosed with pneumonia a few days ago,  patient himself had nausea vomiting and diarrhea about 2 weeks  ago which resolved, a few days ago he started experiencing a  productive cough, some shortness of breath and fevers, his  symptoms did not improve so family brought him to the ER where  his workup was consistent with community-acquired pneumonia,  acute renal failure and dehydration along with hyponatremia. Type of Study: Modified Barium Swallowing Study Reason for Referral: Objectively evaluate swallowing function Previous Swallow Assessment: 1 previous MBS in chart;  unable to  view, however wife denies the need for altered diets in the past Diet Prior to this Study: Dysphagia 1 (puree);Thin liquids Temperature Spikes  Noted: No Respiratory Status: Nasal cannula History of Recent Intubation: No Behavior/Cognition: Alert;Cooperative;Pleasant mood;Requires  cueing Oral Cavity - Dentition: Dentures, not available;Edentulous Oral Motor / Sensory Function: Impaired - see Bedside swallow  eval Self-Feeding Abilities: Able to feed self;Needs assist Patient Positioning: Upright in chair Baseline Vocal Quality: Clear;Low vocal intensity Volitional Cough: Strong;Congested Volitional Swallow: Able to elicit Anatomy: Within functional limits Pharyngeal Secretions: Not observed secondary MBS    Reason for Referral Objectively evaluate swallowing function   Oral Phase Oral Preparation/Oral Phase Oral Phase: Impaired Oral - Nectar Oral - Nectar Cup: Lingual/palatal residue Oral - Thin Oral - Thin Cup: Lingual/palatal residue Oral - Thin Straw: Lingual/palatal residue Oral - Solids Oral - Puree: Lingual/palatal residue Oral - Mechanical Soft: Lingual/palatal residue;Delayed oral  transit;Reduced posterior propulsion;Incomplete tongue to palate  contact;Lingual pumping   Pharyngeal Phase Pharyngeal Phase Pharyngeal Phase: Impaired Pharyngeal - Nectar Pharyngeal - Nectar Cup: Reduced pharyngeal peristalsis;Reduced  epiglottic inversion;Reduced anterior laryngeal mobility;Reduced  laryngeal elevation;Reduced tongue base retraction;Reduced  airway/laryngeal closure;Pharyngeal residue -  valleculae;Pharyngeal residue - pyriform sinuses Pharyngeal - Thin Pharyngeal - Thin Cup: Reduced pharyngeal peristalsis;Reduced  epiglottic inversion;Reduced anterior laryngeal mobility;Reduced  laryngeal elevation;Reduced tongue base retraction;Reduced  airway/laryngeal closure;Pharyngeal residue -  valleculae;Pharyngeal residue - pyriform sinuses;Delayed swallow  initiation;Trace aspiration Pharyngeal - Thin Straw: Reduced pharyngeal peristalsis;Reduced  epiglottic inversion;Reduced anterior laryngeal mobility;Reduced  laryngeal elevation;Reduced tongue base  retraction;Reduced  airway/laryngeal closure;Pharyngeal residue -  valleculae;Pharyngeal residue - pyriform sinuses;Delayed swallow  initiation (improved with a chint uck) Pharyngeal - Solids Pharyngeal - Puree: Reduced pharyngeal peristalsis;Reduced  epiglottic inversion;Reduced anterior laryngeal mobility;Reduced  laryngeal elevation;Reduced tongue base retraction;Reduced  airway/laryngeal closure;Pharyngeal residue -  valleculae;Pharyngeal residue - pyriform sinuses Pharyngeal - Mechanical Soft: Reduced pharyngeal  peristalsis;Reduced epiglottic inversion;Reduced anterior  laryngeal mobility;Reduced laryngeal elevation;Reduced tongue  base retraction;Reduced airway/laryngeal closure;Pharyngeal  residue - valleculae;Pharyngeal residue - pyriform sinuses  Cervical Esophageal Phase    GO    Cervical Esophageal Phase Cervical Esophageal Phase:  (mild esophageal residual no  radiologist present to confirm. )        Mason [place], MA CCC-SLP [phone]  DeBlois, Katherene Ponto 06/22/2013, 12:24 PM      PERTINENT LAB RESULTS: CBC:  Recent Labs  06/22/13 0550  WBC 10.0  HGB 10.6*  HCT 32.3*  PLT 177   CMET CMP     Component Value Date/Time   NA 143 06/22/2013 0550   K 3.5* 06/22/2013 0550   CL 111 06/22/2013 0550   CO2 20 06/22/2013 0550   GLUCOSE 143* 06/22/2013 0550   BUN 22 06/22/2013 0550   CREATININE 1.23 06/22/2013 0550   CALCIUM 8.1* 06/22/2013 0550   PROT 7.8 02/09/2012 1750   ALBUMIN 3.6 02/09/2012 1750   AST 16 02/09/2012 1750   ALT 9 02/09/2012 1750   ALKPHOS 226* 02/09/2012 1750   BILITOT 1.1 02/09/2012 1750   GFRNONAA 51* 06/22/2013 0550   GFRAA 59* 06/22/2013 0550    GFR Estimated Creatinine Clearance: 34.5 ml/min (by C-G formula based on Cr of 1.23). No results found for this basename: LIPASE, AMYLASE,  in the last 72 hours No results found for this basename: CKTOTAL, CKMB, CKMBINDEX, TROPONINI,  in the last 72 hours No components found with this basename: POCBNP,  No results found  for this basename: DDIMER,  in the last 72 hours No results found  for this basename: HGBA1C,  in the last 72 hours No results found for this basename: CHOL, HDL, LDLCALC, TRIG, CHOLHDL, LDLDIRECT,  in the last 72 hours No results found for this basename: TSH, T4TOTAL, FREET3, T3FREE, THYROIDAB,  in the last 72 hours No results found for this basename: VITAMINB12, FOLATE, FERRITIN, TIBC, IRON, RETICCTPCT,  in the last 72 hours Coags: No results found for this basename: PT, INR,  in the last 72 hours Microbiology: Recent Results (from the past 240 hour(s))  CULTURE, BLOOD (ROUTINE X 2)     Status: None   Collection Time    06/20/13  4:53 PM      Result Value Ref Range Status   Specimen Description BLOOD HAND LEFT   Final   Special Requests BOTTLES DRAWN AEROBIC AND ANAEROBIC 5CC   Final   Culture  Setup Time     Final   Value: 06/20/2013 20:38     Performed at [facility]   Culture     Final   Value:        BLOOD CULTURE RECEIVED NO GROWTH TO DATE CULTURE WILL BE HELD FOR 5 DAYS BEFORE ISSUING A FINAL NEGATIVE REPORT     Performed at [facility]   Report Status PENDING   Incomplete  CULTURE, BLOOD (ROUTINE X 2)     Status: None   Collection Time    06/20/13  5:00 PM      Result Value Ref Range Status   Specimen Description BLOOD ARM LEFT   Final   Special Requests BOTTLES DRAWN AEROBIC ONLY 5CC   Final   Culture  Setup Time     Final   Value: 06/20/2013 20:38     Performed at [facility]   Culture     Final   Value:        BLOOD CULTURE RECEIVED NO GROWTH TO DATE CULTURE WILL BE HELD FOR 5 DAYS BEFORE ISSUING A FINAL NEGATIVE REPORT     Performed at [facility]   Report Status PENDING   Incomplete  URINE CULTURE     Status: None   Collection Time    06/21/13  5:55 AM      Result Value Ref Range Status   Specimen Description URINE, CLEAN CATCH   Final   Special Requests NONE   Final   Culture  Setup Time     Final   Value: 06/21/2013 11:35      Performed at [place]     Final   Value: NO GROWTH     Performed at [facility]   Culture     Final   Value: NO GROWTH     Performed at [facility]   Report Status 06/22/2013 FINAL   Final     BRIEF HOSPITAL COURSE:  Community acquired pneumonia  - Patient was admitted, and started on Rocephin and doxycycline. He did have leukocytosis on admission. - Patient is now Afebrile, leukocytosis has resolved. Clinically significantly improved and nontoxic looking.  - Today is day 5 of Rocephin and doxycycline, since clinically improve, he will be transitioned to Avelox for 3 more days. Please repeat a chest x-ray in 6-8 weeks. Although likely community acquired pneumonia, given his history of dysphagia, suspect some amount of aspiration. Patient is on a dysphagia 1 diet. He will definitely need a speech therapy followup while at the skilled nursing facility.  Microbiology: - Influenza PCR negative  -  Blood culture 2/12-negative so far - Streptococcal and Legionella urinary antigen negative  - Urine culture negative  Dehydration  - Patient did have clinical evidence of dehydration on admission. He also had a history of nausea and vomiting. He was started on IV fluids. Nausea and vomiting resolved with supportive care. He is clinically euvolemic. He has been discontinued off IV fluids.  Hypernatremia  - Resolved with half normal saline. Check electrolytes periodically.   Acute renal failure  - Likely prerenal in etiology  - Resolved with IV fluids   Dysphagia  - Seen by speech therapy-subsequently modified barium swallow done on 2/14-current recommendations are for a dysphagia 1 diet. Spoke to spouse on 2/15-agreeable to accept all risks and continue with Dys 1 diet.  - Please have speech therapy follow patient while at skilled nursing facility  COPD  - No evidence of exacerbation-lungs clear.  - Albuterol nebulizer as needed, continue  Symbicort   Hypertension  - Controlled with amlodipine   Dementia with delirium  - Only mild delirium while hospitalized, mostly very alert and following commands.  - Continue with Zoloft, mirtazapine and galantamine.  TODAY-DAY OF DISCHARGE:  Subjective:   [name] today has no headache,no chest abdominal pain,no new weakness tingling or numbness. He is significantly better and without any acute events for the past 2 days.  Objective:   Blood pressure 131/50, pulse 69, temperature 98.2 F (36.8 C), temperature source Oral, resp. rate 18, height 5' 7.5" (1.715 m), weight 58.7 kg (129 lb 6.6 oz), SpO2 94.00%.  Intake/Output Summary (Last 24 hours) at 06/24/13 1009 Last data filed at 06/24/13 0852  Gross per 24 hour  Intake    100 ml  Output    800 ml  Net   -700 ml   Filed Weights   06/22/13 1315 06/23/13 0500 06/24/13 0601  Weight: 58.2 kg (128 lb 4.9 oz) 58.8 kg (129 lb 10.1 oz) 58.7 kg (129 lb 6.6 oz)    Exam Awake and mostly Alert, No new F.N deficits, Normal affect [name].AT,PERRAL Supple Neck,No JVD, No cervical lymphadenopathy appriciated.  Symmetrical Chest wall movement, Good air movement bilaterally, CTAB RRR,No Gallops,Rubs or new Murmurs, No Parasternal Heave +ve B.Sounds, Abd Soft, Non tender, No organomegaly appriciated, No rebound -guarding or rigidity. No Cyanosis, Clubbing or edema, No new Rash or bruise  DISCHARGE CONDITION: Stable  DISPOSITION: SNF  DISCHARGE INSTRUCTIONS:    Activity:  As tolerated with Full fall precautions use walker/cane & assistance as needed  Diet recommendation: Dysphagia 1 (Puree);Thin liquid  Liquid Administration via: Cup;No straw  Medication Administration: Whole meds with puree  Supervision: Patient able to self feed;Full supervision/cueing for compensatory strategies  Compensations: Slow rate;Small sips/bites  Postural Changes and/or Swallow Maneuvers: Seated upright 90 degrees;Upright 30-60 min after meal    CODE STATUS DO NOT RESUSCITATE       Discharge Orders   Future Orders Complete By Expires   Diet - low sodium heart healthy  As directed    Scheduling Instructions:     Dysphagia 1 diet   Increase activity slowly  As directed       Follow-up Information   Follow up with FRIED, ROBERT L, MD. Schedule an appointment as soon as possible for a visit in 1 week. (After discharge from skilled nursing facility.)    Specialty:  Family Medicine   Contact information:   [address] [place] [address] [phone]        Total Time spent on  discharge equals 45 minutes.  SignedOren Binet 06/24/2013 10:09 AM

## 2013-06-24 NOTE — Progress Notes (Signed)
Patient discharged to [facility] IV d/ced (skin intact) Scrotum reddened. Otherwise, skin clean, dry, and intact. Tele d/ced. Report called. No complaints of pain or otherwise. Ptar to transport to facility. [place], [employer]

## 2013-06-24 NOTE — Progress Notes (Signed)
Physical Therapy Treatment Patient Details Name: [name] MRN: [id number] DOB: 1923/04/01 Today's Date: 06/24/2013 Time: 4259-5638 PT Time Calculation (min): 30 min  PT Assessment / Plan / Recommendation  History of Present Illness [name]  is a 80 y.o. male, with history of dementia, COPD, remote history of DVT, hypertension, eyelid surgery in the past who lives at his home with his wife, apparently wife was diagnosed with pneumonia a few days ago, patient himself had nausea vomiting and diarrhea about 2 weeks ago which resolved, a few days ago he started experiencing a productive cough, some shortness of breath and fevers, his symptoms did not improve so family brought him to the ER where his workup was consistent with community-acquired pneumonia, acute renal failure and dehydration along with hyponatremia.   PT Comments   Pt progressing very slowly, performed bed mobility with less assistance but continues to fatigue very quickly, even sitting EOB. Was unsafe for ambulation today due to fatigue. Transferred to chair and performed exercises in chair. Pt with SOB and wheezing during treatment, 1L O2 applied once in chair and RN notified. Pt c/o left leg, foot, and great toe pain. PT will continue to follow until SNF.  Follow Up Recommendations  SNF     Does the patient have the potential to tolerate intense rehabilitation     Barriers to Discharge        Equipment Recommendations  None recommended by PT    Recommendations for Other Services    Frequency Min 3X/week   Progress towards PT Goals Progress towards PT goals: Progressing toward goals  Plan Current plan remains appropriate    Precautions / Restrictions Precautions Precautions: Fall Restrictions Weight Bearing Restrictions: No   Pertinent Vitals/Pain O2 sats 91% on RA    Mobility  Bed Mobility Overal bed mobility: Needs Assistance Bed Mobility: Supine to Sit Supine to sit: Supervision;HOB elevated General  bed mobility comments: able to get to EOB with use of rail and HOB elevated. Needed increased time and HR increased to 150 with effort, qiockly returned to 70s Transfers Overall transfer level: Needs assistance Equipment used: Rolling walker (2 wheeled) Transfers: Sit to/from [facility] Sit to Stand: Min assist Stand pivot transfers: Mod assist General transfer comment: vc's for hand placement and to rise from bed. Pt very fatigued just from sitting EOB and trembling with effort of standing. Assistance needed to turn RW and for balance, pt very unsteady. Ambulation/Gait General Gait Details: did not ambulate today as pt unsafe due to fatigue, exercises performed instead    Exercises General Exercises - Lower Extremity Ankle Circles/Pumps: AROM;Both;10 reps;Seated Long Arc Quad: AROM;Both;15 reps;Seated (with resistance to right , not left due to pain) Straight Leg Raises: AROM;Both;5 reps;Seated Hip Flexion/Marching: AROM;Both;10 reps;Seated Toe Raises: AROM;Both;10 reps;Seated Heel Raises: AROM;Both;10 reps;Seated   PT Diagnosis:    PT Problem List:   PT Treatment Interventions:     PT Goals (current goals can now be found in the care plan section) Acute Rehab PT Goals Patient Stated Goal: get stronger PT Goal Formulation: Patient unable to participate in goal setting Time For Goal Achievement: 06/28/13 Potential to Achieve Goals: Good  Visit Information  Last PT Received On: 06/24/13 Assistance Needed: +1 History of Present Illness: [name]  is a 80 y.o. male, with history of dementia, COPD, remote history of DVT, hypertension, eyelid surgery in the past who lives at his home with his wife, apparently wife was diagnosed with pneumonia a few days ago,  patient himself had nausea vomiting and diarrhea about 2 weeks ago which resolved, a few days ago he started experiencing a productive cough, some shortness of breath and fevers, his symptoms did not improve so  family brought him to the ER where his workup was consistent with community-acquired pneumonia, acute renal failure and dehydration along with hyponatremia.    Subjective Data  Subjective: pt reports that his left great toe, foot, and leg is bothering him more Patient Stated Goal: get stronger   Cognition  Cognition Arousal/Alertness: Awake/alert Behavior During Therapy: WFL for tasks assessed/performed Overall Cognitive Status: History of cognitive impairments - at baseline Memory: Decreased short-term memory    Balance  Balance Overall balance assessment: Needs assistance Sitting-balance support: Single extremity supported;Feet supported Sitting balance-Leahy Scale: Poor Sitting balance - Comments: sat EOB x 12 mins, at first was able to sit without UE support but began to fatigue with posterior lean and needed min A to prevent fall bkwds and left. Continued to have difficulty maintaining sitting once fatigued Postural control: Posterior lean;Left lateral lean Standing balance support: Bilateral upper extremity supported;During functional activity Standing balance-Leahy Scale: Poor Standing balance comment: needed UE support and min/ mod A to maintain standing  End of Session PT - End of Session Equipment Utilized During Treatment: Gait belt;Oxygen Activity Tolerance: Patient limited by fatigue Patient left: in chair;with call bell/phone within reach;with chair alarm set Nurse Communication: Mobility status;Other (comment) (O2 applied after transfer)   [place], [place]  [place], [place] 06/24/2013, 10:47 AM

## 2013-06-24 NOTE — Clinical Social Work Placement (Signed)
Clinical Social Work Department CLINICAL SOCIAL WORK PLACEMENT NOTE 06/24/2013  Patient:  [name], [name]  Account Number:  [id number] Admit date:  06/20/2013  Clinical Social Worker:  Lovey Newcomer  Date/time:  06/22/2013 09:53 AM  Clinical Social Work is seeking post-discharge placement for this patient at the following level of care:   [place]   (*CSW will update this form in Epic as items are completed)   06/21/2013  Patient/family provided with [place] Department of Clinical Social Work's list of facilities offering this level of care within the geographic area requested by the patient (or if unable, by the patient's family).  06/21/2013  Patient/family informed of their freedom to choose among providers that offer the needed level of care, that participate in Medicare, Medicaid or managed care program needed by the patient, have an available bed and are willing to accept the patient.  06/21/2013  Patient/family informed of [facility]' ownership interest in [facility], as well as of the fact that they are under no obligation to receive care at this facility.  PASARR submitted to [employer] on  PASARR number received from [place] on   FL2 transmitted to all facilities in geographic area requested by pt/family on  06/22/2013 FL2 transmitted to all facilities within larger geographic area on   Patient informed that his/her managed care company has contracts with or will negotiate with  certain facilities, including the following:     Patient/family informed of bed offers received:  06/24/2013 Patient chooses bed at [facility] Physician recommends and patient chooses bed at    Patient to be transferred to [facility] on  06/24/2013 Patient to be transferred to facility by Ambulance  The following physician request were entered in Epic:   Additional Comments: Per MD patient ready to [facility]. RN, wife, and facility aware of DC. RN given  number for report. DC packet on chart. Ambulance transport requested for patient. Wife has completed paperwork at facility. CSW signing off.   Liz [place], [place], [place], [phone]

## 2013-06-25 ENCOUNTER — Encounter: Payer: Self-pay | Admitting: Adult Health

## 2013-06-25 ENCOUNTER — Non-Acute Institutional Stay (SKILLED_NURSING_FACILITY): Payer: Medicare HMO | Admitting: Adult Health

## 2013-06-25 DIAGNOSIS — F329 Major depressive disorder, single episode, unspecified: Secondary | ICD-10-CM

## 2013-06-25 DIAGNOSIS — F3289 Other specified depressive episodes: Secondary | ICD-10-CM

## 2013-06-25 DIAGNOSIS — I10 Essential (primary) hypertension: Secondary | ICD-10-CM

## 2013-06-25 DIAGNOSIS — J449 Chronic obstructive pulmonary disease, unspecified: Secondary | ICD-10-CM

## 2013-06-25 DIAGNOSIS — J189 Pneumonia, unspecified organism: Secondary | ICD-10-CM

## 2013-06-25 DIAGNOSIS — F0390 Unspecified dementia without behavioral disturbance: Secondary | ICD-10-CM

## 2013-06-25 DIAGNOSIS — F32A Depression, unspecified: Secondary | ICD-10-CM | POA: Insufficient documentation

## 2013-06-25 NOTE — Progress Notes (Signed)
Patient ID: NIKOLAI WILCZAK, male   DOB: 1922-11-07, 80 y.o.   MRN: [id number]               PROGRESS NOTE  DATE: 06/25/2013  FACILITY: Nursing Home Location: [facility]  LEVEL OF CARE: SNF (31)  Acute Visit  CHIEF COMPLAINT:  Follow-up hospitalization  HISTORY OF PRESENT ILLNESS: This is an 80 year old male who has been admitted to [facility] on 06/24/13 from [facility] with primary discharge diagnosis of Community acquired pneumonia. He has been admitted for a short-term rehabilitation.  REASSESSMENT OF ONGOING PROBLEM(S):  HTN: Pt 's HTN remains stable.  Denies CP, sob, DOE, pedal edema, headaches, dizziness or visual disturbances.  No complications from the medications currently being used.  Last BP :126/54  DEMENTIA: The dementia remaines stable and continues to function adequately in the current living environment with supervision.  The patient has had little changes in behavior. No complications noted from the medications presently being used.  COPD: the COPD remains stable.  Pt denies sob, cough, wheezing or declining exercise tolerance.  No complications from the medications presently being used.   PAST MEDICAL HISTORY : Reviewed.  No changes.  CURRENT MEDICATIONS: Reviewed per [facility]  REVIEW OF SYSTEMS:  GENERAL: no change in appetite, no fatigue, no weight changes, no fever, chills or weakness RESPIRATORY: no cough, SOB, DOE, wheezing, hemoptysis CARDIAC: no chest pain, edema or palpitations GI: no abdominal pain, diarrhea, constipation, heart burn, nausea or vomiting  PHYSICAL EXAMINATION  VS:  See VS section  GENERAL: no acute distress, normal body habitus NECK: supple, trachea midline, no neck masses, no thyroid tenderness, no thyromegaly LYMPHATICS: no LAN in the neck, no supraclavicular LAN RESPIRATORY: breathing is even & unlabored, BS CTAB CARDIAC: RRR, no murmur,no extra heart sounds, no edema GI: abdomen soft, normal BS, no  masses, no tenderness, no hepatomegaly, no splenomegaly PSYCHIATRIC: the patient is alert & oriented to person, affect & behavior appropriate  LABS/RADIOLOGY: Labs reviewed: Basic Metabolic Panel:  Recent Labs  06/20/13 1446 06/20/13 1626 06/21/13 0628 06/22/13 0550  NA 150*  --  149* 143  K 4.1  --  4.5 3.5*  CL 110  --  117* 111  CO2 20  --  22 20  GLUCOSE 131*  --  124* 143*  BUN 41*  --  30* 22  CREATININE 1.53* 1.43* 1.50* 1.23  CALCIUM 9.0  --  8.3* 8.1*   CBC:  Recent Labs  06/20/13 1446 06/20/13 1626 06/21/13 0628 06/22/13 0550  WBC 19.5* 16.5* 15.4* 10.0  NEUTROABS 15.7*  --   --   --   HGB 13.9 13.4 11.3* 10.6*  HCT 41.2 39.4 34.8* 32.3*  MCV 86.7 86.8 87.0 87.1  PLT 270 235 201 177   Cardiac Enzymes:  Recent Labs  06/20/13 1446  TROPONINI <0.30    ASSESSMENT/PLAN:  Community acquired pneumonia - continue Avelox  COPD - stable  Hypertension - well-controlled  Dementia - stable  Depression - stable     CPT CODE: 27517  Seth Bake - NP [facility] [phone]

## 2013-06-26 LAB — CULTURE, BLOOD (ROUTINE X 2)
CULTURE: NO GROWTH
Culture: NO GROWTH

## 2013-07-04 ENCOUNTER — Encounter: Payer: Self-pay | Admitting: *Deleted

## 2013-07-05 ENCOUNTER — Non-Acute Institutional Stay (SKILLED_NURSING_FACILITY): Payer: Medicare HMO | Admitting: Internal Medicine

## 2013-07-05 DIAGNOSIS — J449 Chronic obstructive pulmonary disease, unspecified: Secondary | ICD-10-CM

## 2013-07-05 DIAGNOSIS — I10 Essential (primary) hypertension: Secondary | ICD-10-CM

## 2013-07-05 DIAGNOSIS — E860 Dehydration: Secondary | ICD-10-CM

## 2013-07-05 DIAGNOSIS — F329 Major depressive disorder, single episode, unspecified: Secondary | ICD-10-CM

## 2013-07-05 DIAGNOSIS — F3289 Other specified depressive episodes: Secondary | ICD-10-CM

## 2013-07-05 DIAGNOSIS — F0390 Unspecified dementia without behavioral disturbance: Secondary | ICD-10-CM

## 2013-07-05 DIAGNOSIS — N179 Acute kidney failure, unspecified: Secondary | ICD-10-CM

## 2013-07-05 DIAGNOSIS — E870 Hyperosmolality and hypernatremia: Secondary | ICD-10-CM

## 2013-07-05 DIAGNOSIS — F32A Depression, unspecified: Secondary | ICD-10-CM

## 2013-07-05 DIAGNOSIS — R1310 Dysphagia, unspecified: Secondary | ICD-10-CM

## 2013-07-05 DIAGNOSIS — J189 Pneumonia, unspecified organism: Secondary | ICD-10-CM

## 2013-07-06 ENCOUNTER — Encounter: Payer: Self-pay | Admitting: Internal Medicine

## 2013-07-06 DIAGNOSIS — R1310 Dysphagia, unspecified: Secondary | ICD-10-CM | POA: Insufficient documentation

## 2013-07-06 DIAGNOSIS — E870 Hyperosmolality and hypernatremia: Secondary | ICD-10-CM | POA: Insufficient documentation

## 2013-07-06 NOTE — Assessment & Plan Note (Signed)
Stable - continue home meds 

## 2013-07-06 NOTE — Assessment & Plan Note (Signed)
Resolved with 1/2 NS

## 2013-07-06 NOTE — Progress Notes (Signed)
MRN: [id number] Name: [name]  Sex: male Age: 80 y.o. DOB: 08/29/1922  Jane #: Ron[address] Facility/Room: 306 Level Of Care: SNF Provider: Inocencio Homes D Emergency Contacts: Extended Emergency Contact Information Primary Emergency Contact: Sharmon Leyden Address: [address] [place], [place] [address] Johnnette Litter of [place] Phone: [phone] Relation: Spouse Secondary Emergency Contact: Poplin,Nan Address: [address]          [place], [place] [address] Johnnette Litter of [place] Phone: [phone] Mobile Phone: [phone] Relation: Friend   Allergies: Eggs or egg-derived products; Latex; Peanuts; Penicillins; Erythromycin; Fish allergy; and Sulfa antibiotics  Chief Complaint  Patient presents with  . nursing home admission    HPI: Patient is 80 y.o. male who IS ADMITTED FOR OT/PT after being hospitalized for PNA.  Past Medical History  Diagnosis Date  . COPD (chronic obstructive pulmonary disease)   . GERD (gastroesophageal reflux disease)   . Prostate cancer     "had prostate removed and had chemo" (02/09/2012)  . Depression   . DVT (deep venous thrombosis)   . Alzheimer's disease   . High cholesterol   . Asthma   . Hypertension   . Pneumonia     "4-5 times thru my life" (02/09/2012)  . Chronic bronchitis     "get it q year" (02/09/2012)  . Exertional dyspnea     "sometimes" (02/09/2012)  . History of blood transfusion     "when prostate removed" (02/09/2012)  . Arthritis     "in my fingers"  (02/09/2012)    Past Surgical History  Procedure Laterality Date  . Tonsillectomy  ~ 1938  . Appendectomy  1950's?  . Cataract extraction w/ intraocular lens  implant, bilateral    . Prostatectomy    . Tear duct probing      "have had it done twice; left eye only?" (02/09/2012)  . Eye surgery        Medication List       This list is accurate as of: 07/05/13 11:59 PM.  Always use your most recent med list.                albuterol (5 MG/ML) 0.5% nebulizer solution  Commonly known as:  PROVENTIL  Take 0.5 mLs (2.5 mg total) by nebulization every 2 (two) hours as needed for wheezing.     albuterol 108 (90 BASE) MCG/ACT inhaler  Commonly known as:  PROVENTIL HFA;VENTOLIN HFA  Inhale 2 puffs into the lungs every 6 (six) hours as needed for wheezing.     amLODipine 5 MG tablet  Commonly known as:  NORVASC  Take 2.5 mg by mouth daily.     aspirin 81 MG chewable tablet  Chew 81 mg by mouth daily. For prophylaxis.     budesonide-formoterol 80-4.5 MCG/ACT inhaler  Commonly known as:  SYMBICORT  Inhale 2 puffs into the lungs 2 (two) times daily. For COPD     cholecalciferol 1000 UNITS tablet  Commonly known as:  VITAMIN D  Take 1,000 Units by mouth daily. For supplement     feeding supplement (ENSURE COMPLETE) Liqd  Take 237 mLs by mouth 3 (three) times daily between meals.     galantamine 12 MG tablet  Commonly known as:  RAZADYNE  Take 12 mg by mouth 2 (two) times daily. For dementia     guaiFENesin-dextromethorphan 100-10 MG/5ML syrup  Commonly known as:  ROBITUSSIN DM  Take 5 mLs by  mouth every 4 (four) hours as needed for cough.     mirtazapine 15 MG tablet  Commonly known as:  REMERON  Take 30 mg by mouth at bedtime. For dementia     moxifloxacin 400 MG tablet  Commonly known as:  AVELOX  Take 1 tablet (400 mg total) by mouth daily at 8 pm. For 3 more days from 06/24/48     multivitamin-lutein Caps capsule  Take 1 capsule by mouth daily.     omeprazole 20 MG capsule  Commonly known as:  PRILOSEC  Take 20 mg by mouth daily. For GERD     ondansetron 4 MG tablet  Commonly known as:  ZOFRAN  Take 4 mg by mouth 2 (two) times daily as needed for nausea or vomiting.     oxybutynin 5 MG tablet  Commonly known as:  DITROPAN  Take 5 mg by mouth 2 (two) times daily. For overactive bladder     polyethylene glycol packet  Commonly known as:  MIRALAX / GLYCOLAX  Take 17 g by mouth daily as  needed for mild constipation.     polyvinyl alcohol 1.4 % ophthalmic solution  Commonly known as:  LIQUIFILM TEARS  Place 1 drop into both eyes as needed for dry eyes.     sertraline 100 MG tablet  Commonly known as:  ZOLOFT  Take 100 mg by mouth daily. For depression.     triamcinolone 0.025 % cream  Commonly known as:  KENALOG  Apply topically 2 (two) times daily.        No orders of the defined types were placed in this encounter.    Immunization History  Administered Date(s) Administered  . PPD Test 06/24/2013  . Pneumococcal Polysaccharide-23 06/22/2013    History  Substance Use Topics  . Smoking status: Former Smoker -- 1.00 packs/day for 44 years    Types: Cigarettes  . Smokeless tobacco: Never Used     Comment: 02/09/2012 " stopped smoking cigarrette > 30 years ago"  . Alcohol Use: 1.2 oz/week    1 Cans of beer, 1 Shots of liquor per week     Comment: 02/09/2012 "beer w/dinner  or screwdriver 1-2 X/wk"    Family history is noncontributory    Review of Systems  DATA OBTAINED: from patient GENERAL: Feels well no fevers, fatigue, appetite changes SKIN: No itching, rash or wounds EYES: No eye pain, redness, discharge EARS: No earache, tinnitus, change in hearing NOSE: No congestion, drainage or bleeding  MOUTH/THROAT: No mouth or tooth pain, No sore throat, No difficulty chewing or swallowing  RESPIRATORY: No cough, wheezing, SOB CARDIAC: No chest pain, palpitations, lower extremity edema  GI: No abdominal pain, No N/V/D or constipation, No heartburn or reflux  GU: No dysuria, frequency or urgency, or incontinence  MUSCULOSKELETAL: No unrelieved bone/joint pain NEUROLOGIC: No headache, dizziness or focal weakness PSYCHIATRIC: No overt anxiety or sadness. Sleeps well. No behavior issue.   Filed Vitals:   07/06/13 1753  BP: 130/54  Pulse: 70  Temp: 97.1 F (36.2 C)  Resp: 18    Physical Exam  GENERAL APPEARANCE: Alert, conversant. Appropriately  groomed. No acute distress.  SKIN: No diaphoresis rash, or wounds HEAD: Normocephalic, atraumatic  EYES: Conjunctiva/lids clear. Pupils round, reactive. EOMs intact.  EARS: External exam WNL, canals clear. Hearing grossly normal.  NOSE: No deformity or discharge.  MOUTH/THROAT: Lips w/o lesions  RESPIRATORY: Breathing is even, unlabored. Lung sounds are clear   CARDIOVASCULAR: Heart RRR no murmurs, rubs or gallops.  No peripheral edema.  GASTROINTESTINAL: Abdomen is soft, non-tender, not distended w/ normal bowel sounds GENITOURINARY: Bladder non tender, not distended  MUSCULOSKELETAL: No abnormal joints or musculature NEUROLOGIC: Oriented X3. Cranial nerves 2-12 grossly intact. Moves all extremities no tremor. PSYCHIATRIC: Mood and affect appropriate to situation, no behavioral issues  Patient Active Problem List   Diagnosis Date Noted  . Hypernatremia 07/06/2013  . Dysphagia, unspecified(787.20) 07/06/2013  . Depression 06/25/2013  . Leukocytosis 06/20/2013  . CAP (community acquired pneumonia) 06/20/2013  . ARF (acute renal failure) 06/20/2013  . Dehydration 06/20/2013  . Pneumonia 02/10/2012  . Nausea & vomiting 02/09/2012  . Diarrhea 02/09/2012  . Hypoxia 02/09/2012  . Cough 02/09/2012  . Urinary incontinence 02/09/2012  . HTN (hypertension) 02/09/2012  . COPD (chronic obstructive pulmonary disease) 02/09/2012  . Dementia 02/09/2012    CBC    Component Value Date/Time   WBC 10.0 06/22/2013 0550   RBC 3.71* 06/22/2013 0550   HGB 10.6* 06/22/2013 0550   HCT 32.3* 06/22/2013 0550   PLT 177 06/22/2013 0550   MCV 87.1 06/22/2013 0550   LYMPHSABS 1.9 06/20/2013 1446   MONOABS 1.9* 06/20/2013 1446   EOSABS 0.0 06/20/2013 1446   BASOSABS 0.0 06/20/2013 1446    CMP     Component Value Date/Time   NA 143 06/22/2013 0550   K 3.5* 06/22/2013 0550   CL 111 06/22/2013 0550   CO2 20 06/22/2013 0550   GLUCOSE 143* 06/22/2013 0550   BUN 22 06/22/2013 0550   CREATININE 1.23 06/22/2013  0550   CALCIUM 8.1* 06/22/2013 0550   PROT 7.8 02/09/2012 1750   ALBUMIN 3.6 02/09/2012 1750   AST 16 02/09/2012 1750   ALT 9 02/09/2012 1750   ALKPHOS 226* 02/09/2012 1750   BILITOT 1.1 02/09/2012 1750   GFRNONAA 51* 06/22/2013 0550   GFRAA 59* 06/22/2013 0550    Assessment and Plan  CAP (community acquired pneumonia) Treated with Rocephin and doxycycline in hosp;d/c from hosp on avelox for 3 more days; inf neg, BC neg, Urine neg, strep and legionella neg  Dehydration Resolved with IVF;there had been some vomiting and diarhe  Hypernatremia Resolved with 1/2 NS  ARF (acute renal failure) Resolved with IVF  Dysphagia, unspecified(787.20) Modified barium of 2/14;dysphagia 1 diet rec  COPD (chronic obstructive pulmonary disease) Stable-continue home meds  HTN (hypertension) Controlled with norvasc  Dementia On remeron and razadyne  Depression Continue Zoloft    Hennie Duos, MD

## 2013-07-06 NOTE — Assessment & Plan Note (Signed)
Modified barium of 2/14;dysphagia 1 diet rec

## 2013-07-06 NOTE — Assessment & Plan Note (Signed)
Treated with Rocephin and doxycycline in hosp;d/c from hosp on avelox for 3 more days; inf neg, BC neg, Urine neg, strep and legionella neg

## 2013-07-06 NOTE — Assessment & Plan Note (Signed)
On remeron and razadyne

## 2013-07-06 NOTE — Assessment & Plan Note (Signed)
Resolved with IVF 

## 2013-07-06 NOTE — Assessment & Plan Note (Signed)
Continue Zoloft 

## 2013-07-06 NOTE — Assessment & Plan Note (Signed)
Resolved with IVF;there had been some vomiting and diarhe

## 2013-07-06 NOTE — Assessment & Plan Note (Signed)
Controlled with norvasc

## 2013-08-16 ENCOUNTER — Non-Acute Institutional Stay (SKILLED_NURSING_FACILITY): Payer: Commercial Managed Care - HMO | Admitting: Adult Health

## 2013-08-16 ENCOUNTER — Encounter: Payer: Self-pay | Admitting: Adult Health

## 2013-08-16 DIAGNOSIS — R32 Unspecified urinary incontinence: Secondary | ICD-10-CM

## 2013-08-16 DIAGNOSIS — J449 Chronic obstructive pulmonary disease, unspecified: Secondary | ICD-10-CM

## 2013-08-16 DIAGNOSIS — J189 Pneumonia, unspecified organism: Secondary | ICD-10-CM

## 2013-08-16 DIAGNOSIS — F32A Depression, unspecified: Secondary | ICD-10-CM

## 2013-08-16 DIAGNOSIS — F0390 Unspecified dementia without behavioral disturbance: Secondary | ICD-10-CM

## 2013-08-16 DIAGNOSIS — F3289 Other specified depressive episodes: Secondary | ICD-10-CM

## 2013-08-16 DIAGNOSIS — F329 Major depressive disorder, single episode, unspecified: Secondary | ICD-10-CM

## 2013-08-16 DIAGNOSIS — I10 Essential (primary) hypertension: Secondary | ICD-10-CM

## 2013-08-16 NOTE — Progress Notes (Signed)
Patient ID: [name], male   DOB: 1923-03-26, 80 y.o.   MRN: [id number]         PROGRESS NOTE  DATE:  08/16/13  FACILITY: Nursing Home Location: [facility]  LEVEL OF CARE: SNF (31)  Routine Visit  CHIEF COMPLAINT:  Manage Pneumonia, COPD, Hypertension and Dementia  HISTORY OF PRESENT ILLNESS: This is an 80 year old male who complains of SOB during therapy. Chest x-ray shows patchy pneumonia at the lung bases.  REASSESSMENT OF ONGOING PROBLEM(S):  HTN: Pt 's HTN remains stable.  Denies CP, sob, DOE, pedal edema, headaches, dizziness or visual disturbances.  No complications from the medications currently being used.  Last BP :108/58  DEPRESSION: The depression remains stable. Patient denies ongoing feelings of sadness, insomnia, anedhonia or lack of appetite. No complications reported from the medications currently being used. Staff do not report behavioral problems.  URINARY INCONTINENCE: The urinary incontinence remains stable.  Patient denies worsening urgency, leakage or nocturia.  No complications reported from the medication currently being used.   PAST MEDICAL HISTORY : Reviewed.  No changes.  CURRENT MEDICATIONS: Reviewed per [facility]  REVIEW OF SYSTEMS:  GENERAL: no change in appetite, no fatigue, no weight changes, no fever, chills or weakness RESPIRATORY: no cough, hemoptysis, +wheezing and SOB CARDIAC: no chest pain, edema or palpitations GI: no abdominal pain, diarrhea, constipation, heart burn, nausea or vomiting  PHYSICAL EXAMINATION  GENERAL: no acute distress, normal body habitus NECK: supple, trachea midline, no neck masses, no thyroid tenderness, no thyromegaly LYMPHATICS: no LAN in the neck, no supraclavicular LAN RESPIRATORY: breathing is even & unlabored, BS + minimal wheezing on left lower lung field CARDIAC: RRR, no murmur,no extra heart sounds, no edema GI: abdomen soft, normal BS, no masses, no tenderness, no hepatomegaly, no  splenomegaly PSYCHIATRIC: the patient is alert & oriented to person, affect & behavior appropriate  LABS/RADIOLOGY: 07/30/13 NA 138 K 4.4  Glucose 86  BUN 34  Creatinine 1.6 CA 8.9 WBC 9.0 hemoglobin 11.2 hematocrit 36.4 07/23/13 sodium 135 potassium 4.9 glucose 117 BUN 34 creatinine 1.9 calcium 9.1 07/22/13 WBC 15.9 hemoglobin 10.8 hematocrit 34.4 Labs reviewed: Basic Metabolic Panel:  Recent Labs  06/20/13 1446 06/20/13 1626 06/21/13 0628 06/22/13 0550  NA 150*  --  149* 143  K 4.1  --  4.5 3.5*  CL 110  --  117* 111  CO2 20  --  22 20  GLUCOSE 131*  --  124* 143*  BUN 41*  --  30* 22  CREATININE 1.53* 1.43* 1.50* 1.23  CALCIUM 9.0  --  8.3* 8.1*   CBC:  Recent Labs  06/20/13 1446 06/20/13 1626 06/21/13 0628 06/22/13 0550  WBC 19.5* 16.5* 15.4* 10.0  NEUTROABS 15.7*  --   --   --   HGB 13.9 13.4 11.3* 10.6*  HCT 41.2 39.4 34.8* 32.3*  MCV 86.7 86.8 87.0 87.1  PLT 270 235 201 177   Cardiac Enzymes:  Recent Labs  06/20/13 1446  TROPONINI <0.30    ASSESSMENT/PLAN:  Community acquired pneumonia - start Avelox 400 mg by mouth daily x7 days  COPD - stable; continue Symbicort  Hypertension - well-controlled; continue Norvasc  Dementia - stable; continue Galantamine  Depression - stable; continue Zoloft and Remeron  Urinary incontinence - continue Ditropan     CPT CODE: 00938  Monina Vargas - NP [facility] [phone]

## 2013-09-04 ENCOUNTER — Non-Acute Institutional Stay (SKILLED_NURSING_FACILITY): Payer: Commercial Managed Care - HMO | Admitting: Adult Health

## 2013-09-04 DIAGNOSIS — F0390 Unspecified dementia without behavioral disturbance: Secondary | ICD-10-CM

## 2013-09-04 DIAGNOSIS — R32 Unspecified urinary incontinence: Secondary | ICD-10-CM

## 2013-09-04 DIAGNOSIS — F3289 Other specified depressive episodes: Secondary | ICD-10-CM

## 2013-09-04 DIAGNOSIS — F32A Depression, unspecified: Secondary | ICD-10-CM

## 2013-09-04 DIAGNOSIS — J449 Chronic obstructive pulmonary disease, unspecified: Secondary | ICD-10-CM

## 2013-09-04 DIAGNOSIS — I10 Essential (primary) hypertension: Secondary | ICD-10-CM

## 2013-09-04 DIAGNOSIS — F329 Major depressive disorder, single episode, unspecified: Secondary | ICD-10-CM

## 2013-09-05 ENCOUNTER — Encounter: Payer: Self-pay | Admitting: Adult Health

## 2013-09-05 NOTE — Progress Notes (Signed)
Patient ID: [name], male   DOB: 07-Mar-1923, 80 y.o.   MRN: [id number]         PROGRESS NOTE  DATE:  09/04/13  FACILITY: Nursing Home Location: [facility]  LEVEL OF CARE: SNF (31)  Acute Visit  CHIEF COMPLAINT:  Discharge Notes  HISTORY OF PRESENT ILLNESS: This is an 80 year old male who is for discharge home with Home health PT, OT, Nursing, CNA and Education officer, museum. DME: Oxygen  - patient desats to 80s during exertion due to diagnosis of COPD. He has been admitted to [facility] on 06/24/13 from [facility] with primary discharge diagnosis of Community acquired pneumonia. Patient was admitted to this facility for short-term rehabilitation after the patient's recent hospitalization.  Patient has completed SNF rehabilitation and therapy has cleared the patient for discharge.   REASSESSMENT OF ONGOING PROBLEM(S):  HTN: Pt 's HTN remains stable.  Denies CP, sob, DOE, pedal edema, headaches, dizziness or visual disturbances.  No complications from the medications currently being used.  Last BP :135/62  COPD: the COPD remains stable.  Pt denies sob, cough, wheezing or declining exercise tolerance.  No complications from the medications presently being used.  DEMENTIA: The dementia remaines stable and continues to function adequately in the current living environment with supervision.  The patient has had little changes in behavior. No complications noted from the medications presently being used.   PAST MEDICAL HISTORY : Reviewed.  No changes.  CURRENT MEDICATIONS: Reviewed per [facility]  REVIEW OF SYSTEMS:  GENERAL: no change in appetite, no fatigue, no weight changes, no fever, chills or weakness RESPIRATORY: no cough, hemoptysis, +wheezing and SOB CARDIAC: no chest pain, edema or palpitations GI: no abdominal pain, diarrhea, constipation, heart burn, nausea or vomiting  PHYSICAL EXAMINATION  GENERAL: no acute distress, normal body habitus NECK: supple,  trachea midline, no neck masses, no thyroid tenderness, no thyromegaly RESPIRATORY: breathing is even & unlabored, BS + minimal wheezing on left lower lung field CARDIAC: RRR, no murmur,no extra heart sounds, no edema GI: abdomen soft, normal BS, no masses, no tenderness, no hepatomegaly, no splenomegaly EXTREMITIES:  Able to move all 4 extremities PSYCHIATRIC: the patient is alert & oriented to person, affect & behavior appropriate  LABS/RADIOLOGY: 07/30/13 NA 138 K 4.4  Glucose 86  BUN 34  Creatinine 1.6 CA 8.9 WBC 9.0 hemoglobin 11.2 hematocrit 36.4 07/23/13 sodium 135 potassium 4.9 glucose 117 BUN 34 creatinine 1.9 calcium 9.1 07/22/13 WBC 15.9 hemoglobin 10.8 hematocrit 34.4 Labs reviewed: Basic Metabolic Panel:  Recent Labs  06/20/13 1446 06/20/13 1626 06/21/13 0628 06/22/13 0550  NA 150*  --  149* 143  K 4.1  --  4.5 3.5*  CL 110  --  117* 111  CO2 20  --  22 20  GLUCOSE 131*  --  124* 143*  BUN 41*  --  30* 22  CREATININE 1.53* 1.43* 1.50* 1.23  CALCIUM 9.0  --  8.3* 8.1*   CBC:  Recent Labs  06/20/13 1446 06/20/13 1626 06/21/13 0628 06/22/13 0550  WBC 19.5* 16.5* 15.4* 10.0  NEUTROABS 15.7*  --   --   --   HGB 13.9 13.4 11.3* 10.6*  HCT 41.2 39.4 34.8* 32.3*  MCV 86.7 86.8 87.0 87.1  PLT 270 235 201 177   Cardiac Enzymes:  Recent Labs  06/20/13 1446  TROPONINI <0.30    ASSESSMENT/PLAN:  Community acquired pneumonia - Resolved  COPD - stable; continue Symbicort  Hypertension - well-controlled; continue  Norvasc  Dementia - stable; continue Galantamine  Depression - stable; continue Zoloft and Remeron  Urinary incontinence - continue Ditropan   I have filled out patient's discharge paperwork and written prescriptions.  Patient will receive home health PT, OT, Social worker, Nursing and CNA.  DME provided:O2 @ 2L/min via [employer]  Total discharge time: Greater than 30 minutes Discharge time involved coordination of the discharge process with social  worker, nursing staff and therapy department. Medical justification for home health services/DME verified.   CPT CODE: 40981 Seth Bake - NP [facility] [phone]

## 2013-09-15 DIAGNOSIS — F0390 Unspecified dementia without behavioral disturbance: Secondary | ICD-10-CM

## 2013-09-15 DIAGNOSIS — Z8701 Personal history of pneumonia (recurrent): Secondary | ICD-10-CM

## 2013-09-15 DIAGNOSIS — H4020X0 Unspecified primary angle-closure glaucoma, stage unspecified: Secondary | ICD-10-CM

## 2013-09-15 DIAGNOSIS — J441 Chronic obstructive pulmonary disease with (acute) exacerbation: Secondary | ICD-10-CM

## 2013-11-04 ENCOUNTER — Other Ambulatory Visit: Payer: Self-pay | Admitting: Adult Health

## 2014-06-16 ENCOUNTER — Encounter (HOSPITAL_COMMUNITY): Payer: Self-pay

## 2014-06-16 ENCOUNTER — Emergency Department (HOSPITAL_COMMUNITY): Payer: PPO

## 2014-06-16 ENCOUNTER — Inpatient Hospital Stay (HOSPITAL_COMMUNITY)
Admission: EM | Admit: 2014-06-16 | Discharge: 2014-06-20 | DRG: 178 | Disposition: A | Discharge status: Skilled Nursing Facility | Payer: PPO | Attending: Internal Medicine | Admitting: Internal Medicine

## 2014-06-16 DIAGNOSIS — Z86718 Personal history of other venous thrombosis and embolism: Secondary | ICD-10-CM | POA: Diagnosis not present

## 2014-06-16 DIAGNOSIS — Z91012 Allergy to eggs: Secondary | ICD-10-CM | POA: Diagnosis not present

## 2014-06-16 DIAGNOSIS — Z7982 Long term (current) use of aspirin: Secondary | ICD-10-CM | POA: Diagnosis not present

## 2014-06-16 DIAGNOSIS — Z9841 Cataract extraction status, right eye: Secondary | ICD-10-CM

## 2014-06-16 DIAGNOSIS — Z66 Do not resuscitate: Secondary | ICD-10-CM | POA: Diagnosis present

## 2014-06-16 DIAGNOSIS — N183 Chronic kidney disease, stage 3 (moderate): Secondary | ICD-10-CM | POA: Diagnosis present

## 2014-06-16 DIAGNOSIS — E780 Pure hypercholesterolemia: Secondary | ICD-10-CM | POA: Diagnosis present

## 2014-06-16 DIAGNOSIS — Z87891 Personal history of nicotine dependence: Secondary | ICD-10-CM | POA: Diagnosis not present

## 2014-06-16 DIAGNOSIS — Z8546 Personal history of malignant neoplasm of prostate: Secondary | ICD-10-CM

## 2014-06-16 DIAGNOSIS — Z91013 Allergy to seafood: Secondary | ICD-10-CM | POA: Diagnosis not present

## 2014-06-16 DIAGNOSIS — Z880 Allergy status to penicillin: Secondary | ICD-10-CM

## 2014-06-16 DIAGNOSIS — E785 Hyperlipidemia, unspecified: Secondary | ICD-10-CM | POA: Diagnosis present

## 2014-06-16 DIAGNOSIS — J449 Chronic obstructive pulmonary disease, unspecified: Secondary | ICD-10-CM | POA: Diagnosis present

## 2014-06-16 DIAGNOSIS — Z961 Presence of intraocular lens: Secondary | ICD-10-CM | POA: Diagnosis present

## 2014-06-16 DIAGNOSIS — Z91040 Latex allergy status: Secondary | ICD-10-CM

## 2014-06-16 DIAGNOSIS — D649 Anemia, unspecified: Secondary | ICD-10-CM | POA: Diagnosis present

## 2014-06-16 DIAGNOSIS — F329 Major depressive disorder, single episode, unspecified: Secondary | ICD-10-CM | POA: Diagnosis present

## 2014-06-16 DIAGNOSIS — R059 Cough, unspecified: Secondary | ICD-10-CM

## 2014-06-16 DIAGNOSIS — I129 Hypertensive chronic kidney disease with stage 1 through stage 4 chronic kidney disease, or unspecified chronic kidney disease: Secondary | ICD-10-CM | POA: Diagnosis present

## 2014-06-16 DIAGNOSIS — Z882 Allergy status to sulfonamides status: Secondary | ICD-10-CM

## 2014-06-16 DIAGNOSIS — F0280 Dementia in other diseases classified elsewhere without behavioral disturbance: Secondary | ICD-10-CM | POA: Diagnosis present

## 2014-06-16 DIAGNOSIS — F32A Depression, unspecified: Secondary | ICD-10-CM | POA: Diagnosis present

## 2014-06-16 DIAGNOSIS — Z91010 Allergy to peanuts: Secondary | ICD-10-CM | POA: Diagnosis not present

## 2014-06-16 DIAGNOSIS — Z881 Allergy status to other antibiotic agents status: Secondary | ICD-10-CM | POA: Diagnosis not present

## 2014-06-16 DIAGNOSIS — Z9842 Cataract extraction status, left eye: Secondary | ICD-10-CM

## 2014-06-16 DIAGNOSIS — N179 Acute kidney failure, unspecified: Secondary | ICD-10-CM | POA: Diagnosis present

## 2014-06-16 DIAGNOSIS — J479 Bronchiectasis, uncomplicated: Secondary | ICD-10-CM | POA: Diagnosis present

## 2014-06-16 DIAGNOSIS — E860 Dehydration: Secondary | ICD-10-CM | POA: Diagnosis present

## 2014-06-16 DIAGNOSIS — K219 Gastro-esophageal reflux disease without esophagitis: Secondary | ICD-10-CM | POA: Diagnosis present

## 2014-06-16 DIAGNOSIS — G309 Alzheimer's disease, unspecified: Secondary | ICD-10-CM | POA: Diagnosis present

## 2014-06-16 DIAGNOSIS — J690 Pneumonitis due to inhalation of food and vomit: Secondary | ICD-10-CM

## 2014-06-16 DIAGNOSIS — R05 Cough: Secondary | ICD-10-CM

## 2014-06-16 DIAGNOSIS — J189 Pneumonia, unspecified organism: Secondary | ICD-10-CM

## 2014-06-16 DIAGNOSIS — M1990 Unspecified osteoarthritis, unspecified site: Secondary | ICD-10-CM | POA: Diagnosis present

## 2014-06-16 DIAGNOSIS — D696 Thrombocytopenia, unspecified: Secondary | ICD-10-CM | POA: Diagnosis present

## 2014-06-16 DIAGNOSIS — R0602 Shortness of breath: Secondary | ICD-10-CM | POA: Diagnosis present

## 2014-06-16 LAB — CBC WITH DIFFERENTIAL/PLATELET
BASOS PCT: 0 % (ref 0–1)
Basophils Absolute: 0 10*3/uL (ref 0.0–0.1)
Eosinophils Absolute: 0 10*3/uL (ref 0.0–0.7)
Eosinophils Relative: 0 % (ref 0–5)
HCT: 42.8 % (ref 39.0–52.0)
Hemoglobin: 13.7 g/dL (ref 13.0–17.0)
Lymphocytes Relative: 4 % — ABNORMAL LOW (ref 12–46)
Lymphs Abs: 0.5 10*3/uL — ABNORMAL LOW (ref 0.7–4.0)
MCH: 27.4 pg (ref 26.0–34.0)
MCHC: 32 g/dL (ref 30.0–36.0)
MCV: 85.6 fL (ref 78.0–100.0)
Monocytes Absolute: 1 10*3/uL (ref 0.1–1.0)
Monocytes Relative: 9 % (ref 3–12)
Neutro Abs: 9.4 10*3/uL — ABNORMAL HIGH (ref 1.7–7.7)
Neutrophils Relative %: 87 % — ABNORMAL HIGH (ref 43–77)
PLATELETS: 110 10*3/uL — AB (ref 150–400)
RBC: 5 MIL/uL (ref 4.22–5.81)
RDW: 16 % — ABNORMAL HIGH (ref 11.5–15.5)
WBC: 10.9 10*3/uL — ABNORMAL HIGH (ref 4.0–10.5)

## 2014-06-16 LAB — CBC
HCT: 39.3 % (ref 39.0–52.0)
Hemoglobin: 12.2 g/dL — ABNORMAL LOW (ref 13.0–17.0)
MCH: 26.8 pg (ref 26.0–34.0)
MCHC: 31 g/dL (ref 30.0–36.0)
MCV: 86.4 fL (ref 78.0–100.0)
Platelets: 114 10*3/uL — ABNORMAL LOW (ref 150–400)
RBC: 4.55 MIL/uL (ref 4.22–5.81)
RDW: 16.1 % — ABNORMAL HIGH (ref 11.5–15.5)
WBC: 9.4 10*3/uL (ref 4.0–10.5)

## 2014-06-16 LAB — COMPREHENSIVE METABOLIC PANEL
ALT: 11 U/L (ref 0–53)
AST: 22 U/L (ref 0–37)
Albumin: 4.1 g/dL (ref 3.5–5.2)
Alkaline Phosphatase: 64 U/L (ref 39–117)
Anion gap: 7 (ref 5–15)
BILIRUBIN TOTAL: 1.1 mg/dL (ref 0.3–1.2)
BUN: 30 mg/dL — ABNORMAL HIGH (ref 6–23)
CO2: 26 mmol/L (ref 19–32)
CREATININE: 1.73 mg/dL — AB (ref 0.50–1.35)
Calcium: 9.3 mg/dL (ref 8.4–10.5)
Chloride: 105 mmol/L (ref 96–112)
GFR calc Af Amer: 39 mL/min — ABNORMAL LOW (ref >90)
GFR calc non Af Amer: 33 mL/min — ABNORMAL LOW (ref >90)
Glucose, Bld: 118 mg/dL — ABNORMAL HIGH (ref 70–99)
Potassium: 4.3 mmol/L (ref 3.5–5.1)
Sodium: 138 mmol/L (ref 135–145)
TOTAL PROTEIN: 7.5 g/dL (ref 6.0–8.3)

## 2014-06-16 LAB — CREATININE, SERUM
CREATININE: 1.64 mg/dL — AB (ref 0.50–1.35)
GFR calc non Af Amer: 35 mL/min — ABNORMAL LOW (ref >90)
GFR, EST AFRICAN AMERICAN: 41 mL/min — AB (ref >90)

## 2014-06-16 LAB — BRAIN NATRIURETIC PEPTIDE: B Natriuretic Peptide: 65.6 pg/mL (ref 0.0–100.0)

## 2014-06-16 MED ORDER — OCUVITE-LUTEIN PO CAPS
1.0000 | ORAL_CAPSULE | Freq: Every day | ORAL | Status: DC
  Administered 2014-06-17 – 2014-06-20 (×4): 1 via ORAL
  Filled 2014-06-16 (×4): qty 1

## 2014-06-16 MED ORDER — GALANTAMINE HYDROBROMIDE 4 MG PO TABS
12.0000 mg | ORAL_TABLET | Freq: Two times a day (BID) | ORAL | Status: DC
  Administered 2014-06-16 – 2014-06-20 (×8): 12 mg via ORAL
  Filled 2014-06-16 (×9): qty 3

## 2014-06-16 MED ORDER — KCL IN DEXTROSE-NACL 20-5-0.45 MEQ/L-%-% IV SOLN
INTRAVENOUS | Status: DC
  Administered 2014-06-16: 75 mL/h via INTRAVENOUS
  Administered 2014-06-17: 1000 mL via INTRAVENOUS
  Administered 2014-06-18: 16:00:00 via INTRAVENOUS
  Filled 2014-06-16 (×5): qty 1000

## 2014-06-16 MED ORDER — DEXTROSE 5 % IV SOLN
2.0000 g | Freq: Once | INTRAVENOUS | Status: AC
  Administered 2014-06-16: 2 g via INTRAVENOUS
  Filled 2014-06-16: qty 2

## 2014-06-16 MED ORDER — DEXTROSE 5 % IV SOLN: 2.0000 g | Freq: Three times a day (TID) | INTRAVENOUS | Status: DC

## 2014-06-16 MED ORDER — SERTRALINE HCL 100 MG PO TABS: 100.0000 mg | ORAL_TABLET | Freq: Every day | ORAL | Status: DC

## 2014-06-16 MED ORDER — VANCOMYCIN HCL IN DEXTROSE 1-5 GM/200ML-% IV SOLN
1000.0000 mg | Freq: Once | INTRAVENOUS | Status: AC
Start: 2014-06-16 — End: 2014-06-16
  Administered 2014-06-16: 1000 mg via INTRAVENOUS
  Filled 2014-06-16: qty 200

## 2014-06-16 MED ORDER — PANTOPRAZOLE SODIUM 40 MG PO TBEC
40.0000 mg | DELAYED_RELEASE_TABLET | Freq: Every day | ORAL | Status: DC
  Administered 2014-06-17 – 2014-06-20 (×4): 40 mg via ORAL
  Filled 2014-06-16 (×4): qty 1

## 2014-06-16 MED ORDER — SERTRALINE HCL 50 MG PO TABS
150.0000 mg | ORAL_TABLET | Freq: Every day | ORAL | Status: DC
  Administered 2014-06-17 – 2014-06-20 (×4): 150 mg via ORAL
  Filled 2014-06-16 (×4): qty 1

## 2014-06-16 MED ORDER — VANCOMYCIN HCL IN DEXTROSE 750-5 MG/150ML-% IV SOLN
750.0000 mg | INTRAVENOUS | Status: DC
  Administered 2014-06-17 – 2014-06-19 (×3): 750 mg via INTRAVENOUS
  Filled 2014-06-16 (×3): qty 150

## 2014-06-16 MED ORDER — DEXTROSE 5 % IV SOLN
2.0000 g | INTRAVENOUS | Status: DC
  Administered 2014-06-17 – 2014-06-18 (×2): 2 g via INTRAVENOUS
  Filled 2014-06-16 (×2): qty 2

## 2014-06-16 MED ORDER — ALBUTEROL SULFATE (2.5 MG/3ML) 0.083% IN NEBU: 2.5000 mg | INHALATION_SOLUTION | RESPIRATORY_TRACT | Status: DC | PRN

## 2014-06-16 MED ORDER — SODIUM CHLORIDE 0.9 % IV BOLUS (SEPSIS)
500.0000 mL | Freq: Once | INTRAVENOUS | Status: AC
  Administered 2014-06-16: 500 mL via INTRAVENOUS

## 2014-06-16 MED ORDER — OXYBUTYNIN CHLORIDE 5 MG PO TABS
5.0000 mg | ORAL_TABLET | Freq: Two times a day (BID) | ORAL | Status: DC
  Administered 2014-06-16 – 2014-06-20 (×8): 5 mg via ORAL
  Filled 2014-06-16 (×9): qty 1

## 2014-06-16 MED ORDER — HEPARIN SODIUM (PORCINE) 5000 UNIT/ML IJ SOLN
5000.0000 [IU] | Freq: Three times a day (TID) | INTRAMUSCULAR | Status: DC
  Administered 2014-06-17 – 2014-06-20 (×11): 5000 [IU] via SUBCUTANEOUS
  Filled 2014-06-16 (×14): qty 1

## 2014-06-16 MED ORDER — ALBUTEROL SULFATE (2.5 MG/3ML) 0.083% IN NEBU
2.5000 mg | INHALATION_SOLUTION | Freq: Four times a day (QID) | RESPIRATORY_TRACT | Status: DC
  Administered 2014-06-16 – 2014-06-20 (×14): 2.5 mg via RESPIRATORY_TRACT
  Filled 2014-06-16 (×15): qty 3

## 2014-06-16 MED ORDER — BUDESONIDE-FORMOTEROL FUMARATE 80-4.5 MCG/ACT IN AERO
2.0000 | INHALATION_SPRAY | Freq: Two times a day (BID) | RESPIRATORY_TRACT | Status: DC
  Administered 2014-06-16 – 2014-06-20 (×8): 2 via RESPIRATORY_TRACT
  Filled 2014-06-16: qty 6.9

## 2014-06-16 NOTE — ED Provider Notes (Signed)
CSN: [id number]     Arrival date & time 06/16/14  75 History   First MD Initiated Contact with Patient 06/16/14 1400     Chief Complaint  Patient presents with  . Shortness of Breath  . Cough      HPI  Expand All Collapse All   Per EMS- Patient has been taking antibiotics x 1 week for probable pneumonia according to the family. Patient's family reports increased SOB today. Patient has a productive cough with yellow/green sputum. EMS reports scatted expiratory wheezing and rhonchi on the right.      Past Medical History  Diagnosis Date  . COPD (chronic obstructive pulmonary disease)   . GERD (gastroesophageal reflux disease)   . Prostate cancer     "had prostate removed and had chemo" (02/09/2012)  . Depression   . DVT (deep venous thrombosis)   . Alzheimer's disease   . High cholesterol   . Asthma   . Hypertension   . Pneumonia     "4-5 times thru my life" (02/09/2012)  . Chronic bronchitis     "get it q year" (02/09/2012)  . Exertional dyspnea     "sometimes" (02/09/2012)  . History of blood transfusion     "when prostate removed" (02/09/2012)  . Arthritis     "in my fingers"  (02/09/2012)   Past Surgical History  Procedure Laterality Date  . Tonsillectomy  ~ 1938  . Appendectomy  1950's?  . Cataract extraction w/ intraocular lens  implant, bilateral    . Prostatectomy    . Tear duct probing      "have had it done twice; left eye only?" (02/09/2012)  . Eye surgery     History reviewed. No pertinent family history. History  Substance Use Topics  . Smoking status: Former Smoker -- 1.00 packs/day for 44 years    Types: Cigarettes  . Smokeless tobacco: Never Used     Comment: 02/09/2012 " stopped smoking cigarrette > 30 years ago"  . Alcohol Use: 1.2 oz/week    1 Cans of beer, 1 Shots of liquor per week     Comment: 02/09/2012 "beer w/dinner  or screwdriver 1-2 X/wk"    Review of Systems  Constitutional: Positive for activity change, appetite change and fatigue.  Negative for fever.  Respiratory: Positive for shortness of breath and wheezing.   Gastrointestinal: Positive for nausea and diarrhea. Negative for vomiting.      Allergies  Eggs or egg-derived products; Latex; Peanuts; Penicillins; Erythromycin; Fish allergy; Sulfa antibiotics; and Kenalog  Home Medications   Prior to Admission medications   Medication Sig Start Date End Date Taking? Authorizing Provider  Acetaminophen (APAP) 325 MG tablet Take 650 mg by mouth every 6 (six) hours as needed for pain.   Yes Historical Provider, MD  budesonide-formoterol (SYMBICORT) 80-4.5 MCG/ACT inhaler Inhale 2 puffs into the lungs 2 (two) times daily. For COPD   Yes Historical Provider, MD  cholecalciferol (VITAMIN D) 1000 UNITS tablet Take 1,000 Units by mouth daily. For supplement   Yes Historical Provider, MD  feeding supplement (ENSURE COMPLETE) LIQD Take 237 mLs by mouth 3 (three) times daily between meals. 02/13/12  Yes Marianne L York, PA-C  galantamine (RAZADYNE) 12 MG tablet Take 12 mg by mouth 2 (two) times daily. For dementia   Yes Historical Provider, MD  levofloxacin (LEVAQUIN) 500 MG tablet Take 500 mg by mouth daily.   Yes Historical Provider, MD  multivitamin-lutein (OCUVITE-LUTEIN) CAPS Take 1 capsule by mouth daily.  Yes Historical Provider, MD  omeprazole (PRILOSEC) 20 MG capsule Take 20 mg by mouth daily. For GERD   Yes Historical Provider, MD  oxybutynin (DITROPAN) 5 MG tablet Take 5 mg by mouth 2 (two) times daily. For overactive bladder   Yes Historical Provider, MD  polyvinyl alcohol (LIQUIFILM TEARS) 1.4 % ophthalmic solution Place 1 drop into both eyes as needed for dry eyes.   Yes Historical Provider, MD  PROVENTIL HFA 108 (90 BASE) MCG/ACT inhaler INHALE 2 PUFFS BY MOUTH EVERY 6 HOURS AS NEEDED 11/04/13  Yes Tiffany L Reed, DO  White Petrolatum-Mineral Oil (SYSTANE NIGHTTIME OP) Apply 1 drop to eye at bedtime as needed.   Yes Historical Provider, MD  albuterol (PROVENTIL) (5  MG/ML) 0.5% nebulizer solution Take 0.5 mLs (2.5 mg total) by nebulization every 2 (two) hours as needed for wheezing. Patient not taking: Reported on 06/16/2014 02/13/12   Bobby Rumpf York, PA-C  amLODipine (NORVASC) 5 MG tablet Take 2.5 mg by mouth daily.     Historical Provider, MD  aspirin 81 MG chewable tablet Chew 81 mg by mouth daily. For prophylaxis.    Historical Provider, MD  guaiFENesin-dextromethorphan (ROBITUSSIN DM) 100-10 MG/5ML syrup Take 5 mLs by mouth every 4 (four) hours as needed for cough. Patient not taking: Reported on 06/16/2014 06/24/13   Jonetta Osgood, MD  mirtazapine (REMERON) 15 MG tablet Take 30 mg by mouth at bedtime. For dementia    Historical Provider, MD  ondansetron (ZOFRAN) 4 MG tablet Take 4 mg by mouth 2 (two) times daily as needed for nausea or vomiting.    Historical Provider, MD  polyethylene glycol (MIRALAX / GLYCOLAX) packet Take 17 g by mouth daily as needed for mild constipation. Patient not taking: Reported on 06/16/2014 06/24/13   Jonetta Osgood, MD  sertraline (ZOLOFT) 100 MG tablet Take 150 mg by mouth daily. For depression.    Historical Provider, MD  triamcinolone (KENALOG) 0.025 % cream Apply topically 2 (two) times daily. Patient not taking: Reported on 06/16/2014 02/13/12   Bobby Rumpf York, PA-C   BP 123/63 mmHg  Pulse 74  Temp(Src) 97.7 F (36.5 C) (Oral)  Resp 16  Ht 5\' 8"  (1.727 m)  Wt 125 lb (56.7 kg)  BMI 19.01 kg/m2  SpO2 92% Physical Exam  Constitutional: He is oriented to person, place, and time. He appears well-developed and well-nourished. No distress.  HENT:  Head: Normocephalic and atraumatic.  Eyes: Pupils are equal, round, and reactive to light.  Neck: Normal range of motion.  Cardiovascular: Normal rate and intact distal pulses.   Pulmonary/Chest: No respiratory distress. He has wheezes.  Abdominal: Normal appearance. He exhibits no distension. There is no rebound and no guarding.  Musculoskeletal: Normal range of motion.   Neurological: He is alert and oriented to person, place, and time. No cranial nerve deficit.  Skin: Skin is warm and dry. No rash noted.  Psychiatric: He has a normal mood and affect. His behavior is normal.  Nursing note and vitals reviewed.   ED Course  Procedures (including critical care time) Labs Review Labs Reviewed  CBC WITH DIFFERENTIAL/PLATELET - Abnormal; Notable for the following:    WBC 10.9 (*)    RDW 16.0 (*)    Platelets 110 (*)    Neutrophils Relative % 87 (*)    Neutro Abs 9.4 (*)    Lymphocytes Relative 4 (*)    Lymphs Abs 0.5 (*)    All other components within normal limits  COMPREHENSIVE  METABOLIC PANEL - Abnormal; Notable for the following:    Glucose, Bld 118 (*)    BUN 30 (*)    Creatinine, Ser 1.73 (*)    GFR calc non Af Amer 33 (*)    GFR calc Af Amer 39 (*)    All other components within normal limits  CBC - Abnormal; Notable for the following:    Hemoglobin 12.2 (*)    RDW 16.1 (*)    Platelets 114 (*)    All other components within normal limits  CREATININE, SERUM - Abnormal; Notable for the following:    Creatinine, Ser 1.64 (*)    GFR calc non Af Amer 35 (*)    GFR calc Af Amer 41 (*)    All other components within normal limits  BASIC METABOLIC PANEL - Abnormal; Notable for the following:    Glucose, Bld 119 (*)    BUN 25 (*)    Creatinine, Ser 1.51 (*)    Calcium 8.2 (*)    GFR calc non Af Amer 39 (*)    GFR calc Af Amer 45 (*)    All other components within normal limits  CBC - Abnormal; Notable for the following:    Hemoglobin 11.7 (*)    HCT 36.5 (*)    RDW 16.1 (*)    Platelets 101 (*)    All other components within normal limits  CBC - Abnormal; Notable for the following:    Hemoglobin 11.6 (*)    HCT 35.8 (*)    RDW 16.0 (*)    Platelets 93 (*)    All other components within normal limits  BASIC METABOLIC PANEL - Abnormal; Notable for the following:    Glucose, Bld 106 (*)    GFR calc non Af Amer 54 (*)    GFR calc Af  Amer 63 (*)    All other components within normal limits  BASIC METABOLIC PANEL - Abnormal; Notable for the following:    Glucose, Bld 104 (*)    GFR calc non Af Amer 49 (*)    GFR calc Af Amer 57 (*)    All other components within normal limits  CBC - Abnormal; Notable for the following:    Hemoglobin 12.3 (*)    HCT 38.6 (*)    RDW 15.8 (*)    Platelets 126 (*)    All other components within normal limits  CULTURE, BLOOD (ROUTINE X 2)  CULTURE, BLOOD (ROUTINE X 2)  CULTURE, EXPECTORATED SPUTUM-ASSESSMENT  GRAM STAIN  BRAIN NATRIURETIC PEPTIDE  HIV ANTIBODY (ROUTINE TESTING)  LEGIONELLA ANTIGEN, URINE  STREP PNEUMONIAE URINARY ANTIGEN    Imaging Review Dg Chest 2 View  06/19/2014   CLINICAL DATA:  Pneumonia. Chronic interstitial lung disease. Prostate carcinoma.  EXAM: CHEST  2 VIEW  COMPARISON:  06/16/2014 and 06/20/2013  FINDINGS: Coarse pulmonary opacity is seen in the peripheral lung fields predominantly in the lung bases which shows some interval worsening. This may be due to progression of chronic interstitial lung disease, or superimposed pneumonia or less likely edema.  Ill-defined nodular opacities are again seen in both midlung fields which may be inflammatory or neoplastic.  No evidence pleural effusion.  Heart size is within normal limits.  IMPRESSION: Coarse pulmonary opacity with bibasilar predominance, and some interval worsening. This may be due to progression of chronic interstitial lung disease, or superimposed acute process such as pneumonia or less likely edema.  Ill-defined nodular opacities in both mid lung fields, which may be  inflammatory or neoplastic. Chest CT is recommended for further evaluation.   Electronically Signed   By: Earle Gell M.D.   On: 06/19/2014 08:58   Ct Chest W Contrast  06/19/2014   CLINICAL DATA:  Abnormal chest radiograph.  EXAM: CT CHEST WITH CONTRAST  TECHNIQUE: Multidetector CT imaging of the chest was performed during intravenous  contrast administration.  CONTRAST:  73mL OMNIPAQUE IOHEXOL 300 MG/ML  SOLN  COMPARISON:  Chest radiograph - earlier same day; 06/20/2013 chest CT- 01/03/2011  FINDINGS: There is cystic bronchiectasis involving the right lower lobe (representative images 42 and 45, series 5). progressed since the 2012 examination with worsening interstitial and consolidative opacities. The central pulmonary airways remain remain patent.  There are consolidative opacities within the dependent portion of the left lower lobe (image 52, series 5) intra favored to represent a combination of atelectasis and scar. Grossly unchanged slightly asymmetric biapical pleural parenchymal thickening, left greater than right.  Pleural-parenchymal thickening about the superior aspects of the right major fissure. No pleural effusion or pneumothorax.  The sub solid opacity within the left lung apex is grossly unchanged since the 2012 examination, measuring 1.4 x 0.9 cm (image 22, series 5) as is the approximately 0.9 cm nodule within the right lung apex (image 14, series 5) both of which are favored to be the sequela of prior infection.  Shotty suprahilar nodal conglomeration is not enlarged by size criteria measuring approximately 0.9 cm in greatest short axis diameter. No bulky mediastinal, hilar or axillary lymphadenopathy.  Normal heart size. No pericardial effusion. Although this examination was not tailored for the evaluation the pulmonary arteries, there are no discrete filling defects within the central pulmonary arterial tree to suggest central pulmonary embolism. Normal caliber of the main pulmonary artery. Moderate amount of mixed calcified and noncalcified atherosclerotic plaque within a normal caliber thoracic aorta. No definite thoracic aortic dissection or periaortic stranding.  Note is made of an approximately 1.4 cm hypo attenuating nodule within in the left lobe of the thyroid, unchanged since the 2012 examination.  Limited evaluation  of the upper abdomen demonstrates an approximately 0.6 cm stone within the neck of an otherwise normal appearing gallbladder.  Grossly unchanged moderate (approximately 40%) compression deformity involving the superior endplate of the O87 vertebral body, similar to prior chest radiograph performed 06/2013.  IMPRESSION: 1. Progressive cystic bronchiectasis involving the right lower lobe with associated worsening interstitial consolidative airspace opacities - favored to represent the progression of chronic atypical infection (such as MAC), though ongoing aspiration could have a similar appearance. Clinical correlation is advised. 2. Nodules within the bilateral upper lobes are unchanged since the 2012 examination and thus of benign etiology, likely the sequela of prior infection and/or inflammation. 3. Incidentally noted cholelithiasis without evidence of cholecystitis. 4. Unchanged moderate (approximately 40%) compression deformity involving the superior endplate of the O67 vertebral body, similar to prior chest radiograph performed 06/2013.   Electronically Signed   By: Sandi Mariscal M.D.   On: 06/19/2014 22:05     EKG Interpretation   Date/Time:  Monday June 16 2014 13:28:22 EST Ventricular Rate:  88 PR Interval:  164 QRS Duration: 151 QT Interval:  401 QTC Calculation: 485 R Axis:   -52 Text Interpretation:  Sinus rhythm Probable left atrial enlargement Left  bundle branch block No significant change since last tracing Confirmed by  Avereigh Spainhower  MD, Aseret Hoffman ([id number]) on 06/16/2014 2:18:43 PM     Patient admitted MDM   Final diagnoses:  Cough  Dot Lanes, MD 06/20/14 1150

## 2014-06-16 NOTE — Progress Notes (Signed)
ANTIBIOTIC CONSULT NOTE - INITIAL  Pharmacy Consult for Vancomycin and Cefepime Indication: pneumonia  Allergies  Allergen Reactions  . Eggs Or Egg-Derived Products Nausea And Vomiting and Rash  . Latex Rash  . Peanuts [Peanut Oil] Anaphylaxis and Swelling    "swelling of the face"  . Penicillins Rash  . Erythromycin     "I don't remember"  . Fish Allergy     "I never eat fish except for halibut"  . Sulfa Antibiotics Other (See Comments)    Reaction unknown  . Kenalog [Triamcinolone Acetonide] Rash   Patient Measurements: Height: 5\' 8"  (172.7 cm) Weight: 125 lb (56.7 kg) IBW/kg (Calculated) : 68.4  Vital Signs: Temp: 98.4 F (36.9 C) (02/08 1645) Temp Source: Oral (02/08 1645) BP: 119/61 mmHg (02/08 1645) Pulse Rate: 77 (02/08 1645) Intake/Output from previous day:   Intake/Output from this shift:    Labs:  Recent Labs  06/16/14 1428 06/16/14 1721  WBC 10.9* 9.4  HGB 13.7 12.2*  PLT 110* 114*  CREATININE 1.73* 1.64*   Estimated Creatinine Clearance: 24.5 mL/min (by C-G formula based on Cr of 1.64). No results for input(s): VANCOTROUGH, VANCOPEAK, VANCORANDOM, GENTTROUGH, GENTPEAK, GENTRANDOM, TOBRATROUGH, TOBRAPEAK, TOBRARND, AMIKACINPEAK, AMIKACINTROU, AMIKACIN in the last 72 hours.   Microbiology: No results found for this or any previous visit (from the past 720 hour(s)).  Medical History: Past Medical History  Diagnosis Date  . COPD (chronic obstructive pulmonary disease)   . GERD (gastroesophageal reflux disease)   . Prostate cancer     "had prostate removed and had chemo" (02/09/2012)  . Depression   . DVT (deep venous thrombosis)   . Alzheimer's disease   . High cholesterol   . Asthma   . Hypertension   . Pneumonia     "4-5 times thru my life" (02/09/2012)  . Chronic bronchitis     "get it q year" (02/09/2012)  . Exertional dyspnea     "sometimes" (02/09/2012)  . History of blood transfusion     "when prostate removed" (02/09/2012)  .  Arthritis     "in my fingers"  (02/09/2012)    Medications:  Anti-infectives    Start     Dose/Rate Route Frequency Ordered Stop   06/16/14 1800  vancomycin (VANCOCIN) IVPB 1000 mg/200 mL premix     1,000 mg200 mL/hr over 60 Minutes Intravenous  Once 06/16/14 1705     06/16/14 1715  aztreonam (AZACTAM) 2 g in dextrose 5 % 50 mL IVPB     2 g100 mL/hr over 30 Minutes Intravenous  Once 06/16/14 1708 06/16/14 2022   06/16/14 1645  aztreonam (AZACTAM) 2 g in dextrose 5 % 50 mL IVPB  Status:  Discontinued     2 g100 mL/hr over 30 Minutes Intravenous 3 times per day 06/16/14 1639 06/16/14 1807     Assessment: 81yo M w/ weakness and SOB unimproved on Levaquin PTA. CXR indicated possible atypical pneumonia. Azactam and Vancomycin single-doses ordered in the ED. Admitted and pharmacy was asked to dose Vanc and Cefepime. Reports rash w/ penicillins but has tolerated Rocephin.  2/8 >> Cefepime >> 2/8 >> Vanc >>   Tmax: AF WBCs: slightly elevated Renal: ARF, SCr 1.64, CrCl 25CG, 31N  2/8 blood x 2: sent / sputum: ordered   Goal of Therapy:  Vancomycin trough level 15-20 mcg/ml  Appropriate antibiotic dosing for renal function; eradication of infection  Plan:  Vancomycin 750mg  IV q24h - starting on 2/9. Cefepime 2g IV q24h - starting 8hrs after Azactam  dose. Measure Vanc trough at steady state. Follow up renal fxn, culture results, and clinical course.  Romeo Rabon, PharmD, pager [phone]. 06/16/2014,8:30 PM.

## 2014-06-16 NOTE — ED Notes (Signed)
Per EMS- Patient has been taking antibiotics x 1 week for probable pneumonia according to the family. Patient's family reports increased SOB today. Patient has a productive cough with yellow/green sputum. EMS reports scatted expiratory wheezing and rhonchi on the right.

## 2014-06-16 NOTE — H&P (Signed)
[facility] History and Physical  [name] POE:[id number] DOB: 1922-06-17 DOA: 06/16/2014  Referring physician: Dr. Audie Pinto PCP: Abigail Miyamoto, MD   Chief Complaint: weakness and sob  HPI: [name] is a 81 y.o. male  With prior history of guillan barre, COPD, hypertension, depression. Who presents to the hospital complaining of weakness and shortness of breath. This has been going on for the last week. He has tried Levaquin with not much improvement. Given persistent symptoms the patient presented to the ED for further evaluation and recommendations. The patient also endorses poor oral intake during this time denies any hemoptysis. Wife at bedside reports that he did have a pulmonary nodule which was worked up roughly a year ago.  While in the ED patient had chest x-ray which reported suspicion for atypical pneumonia. We were consulted for further medical evaluation recommendations   Review of Systems:  Constitutional:  No weight loss, night sweats, Fevers, chills, + fatigue.  HEENT:  No headaches, Difficulty swallowing,Tooth/dental problems,Sore throat,  No sneezing, itching, ear ache, nasal congestion, post nasal drip,  Cardio-vascular:  No chest pain, Orthopnea, PND, swelling in lower extremities, anasarca, dizziness, palpitations  GI:  No heartburn, indigestion, abdominal pain, nausea, vomiting, diarrhea, change in bowel habits,+ loss of appetite  Resp:  + shortness of breath with exertion or at rest. No excess mucus, no productive cough, + non-productive cough, No coughing up of blood.No change in color of mucus.No wheezing.No chest wall deformity  Skin:  no rash or lesions.  GU:  no dysuria, change in color of urine, no urgency or frequency. No flank pain.  Musculoskeletal:  No joint pain or swelling. No decreased range of motion. No back pain.  Psych:  No change in mood or affect. No depression or anxiety. No memory loss.   Past Medical History    Diagnosis Date  . COPD (chronic obstructive pulmonary disease)   . GERD (gastroesophageal reflux disease)   . Prostate cancer     "had prostate removed and had chemo" (02/09/2012)  . Depression   . DVT (deep venous thrombosis)   . Alzheimer's disease   . High cholesterol   . Asthma   . Hypertension   . Pneumonia     "4-5 times thru my life" (02/09/2012)  . Chronic bronchitis     "get it q year" (02/09/2012)  . Exertional dyspnea     "sometimes" (02/09/2012)  . History of blood transfusion     "when prostate removed" (02/09/2012)  . Arthritis     "in my fingers"  (02/09/2012)   Past Surgical History  Procedure Laterality Date  . Tonsillectomy  ~ 1938  . Appendectomy  1950's?  . Cataract extraction w/ intraocular lens  implant, bilateral    . Prostatectomy    . Tear duct probing      "have had it done twice; left eye only?" (02/09/2012)  . Eye surgery     Social History:  reports that he has quit smoking. His smoking use included Cigarettes. He has a 44 pack-year smoking history. He has never used smokeless tobacco. He reports that he drinks about 1.2 oz of alcohol per week. He reports that he does not use illicit drugs.  Allergies  Allergen Reactions  . Eggs Or Egg-Derived Products Nausea And Vomiting and Rash  . Latex Rash  . Peanuts [Peanut Oil] Anaphylaxis and Swelling    "swelling of the face"  . Penicillins Rash  . Erythromycin     "I  don't remember"  . Fish Allergy     "I never eat fish except for halibut"  . Sulfa Antibiotics Other (See Comments)    Reaction unknown  . Kenalog [Triamcinolone Acetonide] Rash    History reviewed. No pertinent family history.   Prior to Admission medications   Medication Sig Start Date End Date Taking? Authorizing Provider  Acetaminophen (APAP) 325 MG tablet Take 650 mg by mouth every 6 (six) hours as needed for pain.   Yes Historical Provider, MD  budesonide-formoterol (SYMBICORT) 80-4.5 MCG/ACT inhaler Inhale 2 puffs into the  lungs 2 (two) times daily. For COPD   Yes Historical Provider, MD  cholecalciferol (VITAMIN D) 1000 UNITS tablet Take 1,000 Units by mouth daily. For supplement   Yes Historical Provider, MD  feeding supplement (ENSURE COMPLETE) LIQD Take 237 mLs by mouth 3 (three) times daily between meals. 02/13/12  Yes Marianne L York, PA-C  galantamine (RAZADYNE) 12 MG tablet Take 12 mg by mouth 2 (two) times daily. For dementia   Yes Historical Provider, MD  levofloxacin (LEVAQUIN) 500 MG tablet Take 500 mg by mouth daily.   Yes Historical Provider, MD  multivitamin-lutein (OCUVITE-LUTEIN) CAPS Take 1 capsule by mouth daily.   Yes Historical Provider, MD  omeprazole (PRILOSEC) 20 MG capsule Take 20 mg by mouth daily. For GERD   Yes Historical Provider, MD  oxybutynin (DITROPAN) 5 MG tablet Take 5 mg by mouth 2 (two) times daily. For overactive bladder   Yes Historical Provider, MD  polyvinyl alcohol (LIQUIFILM TEARS) 1.4 % ophthalmic solution Place 1 drop into both eyes as needed for dry eyes.   Yes Historical Provider, MD  PROVENTIL HFA 108 (90 BASE) MCG/ACT inhaler INHALE 2 PUFFS BY MOUTH EVERY 6 HOURS AS NEEDED 11/04/13  Yes Tiffany L Reed, DO  White Petrolatum-Mineral Oil (SYSTANE NIGHTTIME OP) Apply 1 drop to eye at bedtime as needed.   Yes Historical Provider, MD  albuterol (PROVENTIL) (5 MG/ML) 0.5% nebulizer solution Take 0.5 mLs (2.5 mg total) by nebulization every 2 (two) hours as needed for wheezing. Patient not taking: Reported on 06/16/2014 02/13/12   Bobby Rumpf York, PA-C  amLODipine (NORVASC) 5 MG tablet Take 2.5 mg by mouth daily.     Historical Provider, MD  aspirin 81 MG chewable tablet Chew 81 mg by mouth daily. For prophylaxis.    Historical Provider, MD  guaiFENesin-dextromethorphan (ROBITUSSIN DM) 100-10 MG/5ML syrup Take 5 mLs by mouth every 4 (four) hours as needed for cough. Patient not taking: Reported on 06/16/2014 06/24/13   Jonetta Osgood, MD  mirtazapine (REMERON) 15 MG tablet Take 30  mg by mouth at bedtime. For dementia    Historical Provider, MD  ondansetron (ZOFRAN) 4 MG tablet Take 4 mg by mouth 2 (two) times daily as needed for nausea or vomiting.    Historical Provider, MD  polyethylene glycol (MIRALAX / GLYCOLAX) packet Take 17 g by mouth daily as needed for mild constipation. Patient not taking: Reported on 06/16/2014 06/24/13   Jonetta Osgood, MD  sertraline (ZOLOFT) 100 MG tablet Take 100 mg by mouth daily. For depression.    Historical Provider, MD  triamcinolone (KENALOG) 0.025 % cream Apply topically 2 (two) times daily. Patient not taking: Reported on 06/16/2014 02/13/12   Melton Alar, PA-C   Physical Exam: Filed Vitals:   06/16/14 1326 06/16/14 1330 06/16/14 1334  BP:  127/67   Pulse:  89   Temp:  97.9 F (36.6 C)   TempSrc:  Oral  Resp:  17   SpO2: 99% 91% 99%    Wt Readings from Last 3 Encounters:  09/04/13 54.613 kg (120 lb 6.4 oz)  08/16/13 55.067 kg (121 lb 6.4 oz)  06/25/13 56.7 kg (125 lb)    General:  Appears calm and comfortable Eyes: PERRL, normal lids, irises & conjunctiva ENT: grossly normal hearing, lips & tongue Neck: no LAD, masses or thyromegaly Cardiovascular: RRR, no m/r/g. No LE edema. Respiratory: + rhales BL, no wheezes, equal chest rise Abdomen: soft, nt, nd Skin: no rash or induration seen on limited exam Musculoskeletal: grossly normal tone BUE/BLE Psychiatric: grossly normal mood and affect, speech fluent and appropriate Neurologic:hard of hearing, no facial asymmetry, moves extremities equally          Labs on Admission:  Basic Metabolic Panel:  Recent Labs Lab 06/16/14 1428  NA 138  K 4.3  CL 105  CO2 26  GLUCOSE 118*  BUN 30*  CREATININE 1.73*  CALCIUM 9.3   Liver Function Tests:  Recent Labs Lab 06/16/14 1428  AST 22  ALT 11  ALKPHOS 64  BILITOT 1.1  PROT 7.5  ALBUMIN 4.1   No results for input(s): LIPASE, AMYLASE in the last 168 hours. No results for input(s): AMMONIA in the last 168  hours. CBC:  Recent Labs Lab 06/16/14 1428  WBC 10.9*  NEUTROABS 9.4*  HGB 13.7  HCT 42.8  MCV 85.6  PLT 110*   Cardiac Enzymes: No results for input(s): CKTOTAL, CKMB, CKMBINDEX, TROPONINI in the last 168 hours.  BNP (last 3 results)  Recent Labs  06/16/14 1428  BNP 65.6    ProBNP (last 3 results) No results for input(s): PROBNP in the last 8760 hours.  CBG: No results for input(s): GLUCAP in the last 168 hours.  Radiological Exams on Admission: Dg Chest 2 View  06/16/2014   CLINICAL DATA:  Cough. History of prostate cancer. COPD and asthma.  EXAM: CHEST  2 VIEW  COMPARISON:  06/20/2013  FINDINGS: Coarse interstitial accentuation in the lungs with scattered irregular linear opacities, biapical scarring, and finer interstitial opacification at the lung bases. Spiculated nodule, right mid lung scattered scarring. Atherosclerotic aortic arch.  IMPRESSION: 1. Scattered scarring and bilateral interstitial opacities which may reflect atypical pneumonia or an unusual pattern of edema. 2. Spiculated nodule in the right mid lung. Lung cancer not excluded. Consider followup chest CT in the short-term in order to further assess. 3. Biapical scarring. 4. Atherosclerotic aortic arch.   Electronically Signed   By: Sherryl Barters M.D.   On: 06/16/2014 14:29    EKG: Independently reviewed. Sinus Rhythm with no st elevations or depressions  Assessment/Plan   CAP (community acquired pneumonia) -Pneumonia order set placed please review for details - We'll cover with a history in am and vancomycin  Active Problems:   COPD (chronic obstructive pulmonary disease) - Given no wheezing will avoid steroids in lieu of active infection - We'll continue bronchodilators and Symbicort    ARF (acute renal failure) - Most likely prerenal as such will place on gentle fluid hydration    Dehydration - Most likely secondary to principal problem. Place on gentle fluid hydration and reassess next  a.m.    Depression -Stable continue Zoloft   Code Status: dnr DVT Prophylaxis: heparin Family Communication: Discussed with family at bedside Disposition Plan: MedSurg  Time spent: > 45 minutes  Velvet Bathe [facility] Pager [phone]

## 2014-06-16 NOTE — ED Notes (Signed)
Bed: WA09 Expected date:  Expected time:  Means of arrival:  Comments: EMS 

## 2014-06-16 NOTE — ED Notes (Signed)
Pt in [place]

## 2014-06-16 NOTE — Progress Notes (Signed)
Utilization Review completed.  Rilyn Upshaw RN CM  

## 2014-06-16 NOTE — ED Notes (Signed)
MD at bedside. 

## 2014-06-17 DIAGNOSIS — N179 Acute kidney failure, unspecified: Secondary | ICD-10-CM

## 2014-06-17 LAB — BASIC METABOLIC PANEL
ANION GAP: 6 (ref 5–15)
BUN: 25 mg/dL — ABNORMAL HIGH (ref 6–23)
CALCIUM: 8.2 mg/dL — AB (ref 8.4–10.5)
CO2: 25 mmol/L (ref 19–32)
CREATININE: 1.51 mg/dL — AB (ref 0.50–1.35)
Chloride: 104 mmol/L (ref 96–112)
GFR calc non Af Amer: 39 mL/min — ABNORMAL LOW (ref >90)
GFR, EST AFRICAN AMERICAN: 45 mL/min — AB (ref >90)
Glucose, Bld: 119 mg/dL — ABNORMAL HIGH (ref 70–99)
Potassium: 4.4 mmol/L (ref 3.5–5.1)
Sodium: 135 mmol/L (ref 135–145)

## 2014-06-17 LAB — CBC
HEMATOCRIT: 36.5 % — AB (ref 39.0–52.0)
Hemoglobin: 11.7 g/dL — ABNORMAL LOW (ref 13.0–17.0)
MCH: 27.3 pg (ref 26.0–34.0)
MCHC: 32.1 g/dL (ref 30.0–36.0)
MCV: 85.1 fL (ref 78.0–100.0)
Platelets: 101 10*3/uL — ABNORMAL LOW (ref 150–400)
RBC: 4.29 MIL/uL (ref 4.22–5.81)
RDW: 16.1 % — ABNORMAL HIGH (ref 11.5–15.5)
WBC: 8.3 10*3/uL (ref 4.0–10.5)

## 2014-06-17 NOTE — Progress Notes (Signed)
[facility] PROGRESS NOTE  KAVI ALMQUIST ZHY:[id number] DOB: 03/19/1923 DOA: 06/16/2014 PCP: Abigail Miyamoto, MD  Assessment/Plan: CAP (community acquired pneumonia) -Pneumonia order set placed please review for details - We'll cover cefepime am and vancomycin  Active Problems:  COPD (chronic obstructive pulmonary disease) - Given no wheezing will avoid steroids in lieu of active infection - We'll continue bronchodilators and Symbicort   ARF (acute renal failure) - Most likely prerenal as such will continue on gentle fluid hydration   Dehydration - Most likely secondary to principal problem.  - improving on gentle fluid rehydration   Depression -Stable continue Zoloft  Code Status: DNR Family Communication: discussed with daughter at bedside Disposition Plan: Pending improvement in condition   Consultants:  None  Procedures:  None  Antibiotics:  Cefepime  Vancomycin  HPI/Subjective: Pt has no new complaints. No acute issues overnight.  Objective: Filed Vitals:   06/17/14 0454  BP: 132/45  Pulse: 67  Temp: 98.2 F (36.8 C)  Resp: 20    Intake/Output Summary (Last 24 hours) at 06/17/14 1557 Last data filed at 06/17/14 0841  Gross per 24 hour  Intake   2390 ml  Output    150 ml  Net   2240 ml   Filed Weights   06/16/14 1800  Weight: 56.7 kg (125 lb)    Exam:   General:  Pt in nad, alert and awake  Cardiovascular: rrr, no mrg  Respiratory: no wheezes, rhales, equal chest rise  Abdomen: soft, Nd, no guarding  Musculoskeletal: no cyanosis or clubbing   Data Reviewed: Basic Metabolic Panel:  Recent Labs Lab 06/16/14 1428 06/16/14 1721 06/17/14 0535  NA 138  --  135  K 4.3  --  4.4  CL 105  --  104  CO2 26  --  25  GLUCOSE 118*  --  119*  BUN 30*  --  25*  CREATININE 1.73* 1.64* 1.51*  CALCIUM 9.3  --  8.2*   Liver Function Tests:  Recent Labs Lab 06/16/14 1428  AST 22  ALT 11  ALKPHOS 64  BILITOT 1.1  PROT  7.5  ALBUMIN 4.1   No results for input(s): LIPASE, AMYLASE in the last 168 hours. No results for input(s): AMMONIA in the last 168 hours. CBC:  Recent Labs Lab 06/16/14 1428 06/16/14 1721 06/17/14 0535  WBC 10.9* 9.4 8.3  NEUTROABS 9.4*  --   --   HGB 13.7 12.2* 11.7*  HCT 42.8 39.3 36.5*  MCV 85.6 86.4 85.1  PLT 110* 114* 101*   Cardiac Enzymes: No results for input(s): CKTOTAL, CKMB, CKMBINDEX, TROPONINI in the last 168 hours. BNP (last 3 results)  Recent Labs  06/16/14 1428  BNP 65.6    ProBNP (last 3 results) No results for input(s): PROBNP in the last 8760 hours.  CBG: No results for input(s): GLUCAP in the last 168 hours.  No results found for this or any previous visit (from the past 240 hour(s)).   Studies: Dg Chest 2 View  06/16/2014   CLINICAL DATA:  Cough. History of prostate cancer. COPD and asthma.  EXAM: CHEST  2 VIEW  COMPARISON:  06/20/2013  FINDINGS: Coarse interstitial accentuation in the lungs with scattered irregular linear opacities, biapical scarring, and finer interstitial opacification at the lung bases. Spiculated nodule, right mid lung scattered scarring. Atherosclerotic aortic arch.  IMPRESSION: 1. Scattered scarring and bilateral interstitial opacities which may reflect atypical pneumonia or an unusual pattern of edema. 2. Spiculated nodule in the  right mid lung. Lung cancer not excluded. Consider followup chest CT in the short-term in order to further assess. 3. Biapical scarring. 4. Atherosclerotic aortic arch.   Electronically Signed   By: Sherryl Barters M.D.   On: 06/16/2014 14:29    Scheduled Meds: . albuterol  2.5 mg Nebulization Q6H  . budesonide-formoterol  2 puff Inhalation BID  . ceFEPime (MAXIPIME) IV  2 g Intravenous Q24H  . galantamine  12 mg Oral BID  . heparin  5,000 Units Subcutaneous 3 times per day  . multivitamin-lutein  1 capsule Oral Daily  . oxybutynin  5 mg Oral BID  . pantoprazole  40 mg Oral Daily  . sertraline   150 mg Oral Daily  . vancomycin  750 mg Intravenous Q24H   Continuous Infusions: . dextrose 5 % and 0.45 % NaCl with KCl 20 mEq/L 1,000 mL (06/17/14 1218)     Time spent: > 35 minutes    Velvet Bathe  [facility] Pager [phone] If 7PM-7AM, please contact night-coverage at [email or web address], password [email or web address] 06/17/2014, 3:57 PM  LOS: 1 day

## 2014-06-18 DIAGNOSIS — N189 Chronic kidney disease, unspecified: Secondary | ICD-10-CM

## 2014-06-18 DIAGNOSIS — J449 Chronic obstructive pulmonary disease, unspecified: Secondary | ICD-10-CM

## 2014-06-18 LAB — STREP PNEUMONIAE URINARY ANTIGEN: STREP PNEUMO URINARY ANTIGEN: NEGATIVE

## 2014-06-18 LAB — HIV ANTIBODY (ROUTINE TESTING W REFLEX): HIV SCREEN 4TH GENERATION: NONREACTIVE

## 2014-06-18 LAB — LEGIONELLA ANTIGEN, URINE

## 2014-06-18 MED ORDER — DEXTROSE 5 % IV SOLN
1.0000 g | INTRAVENOUS | Status: DC
  Administered 2014-06-19 – 2014-06-20 (×2): 1 g via INTRAVENOUS
  Filled 2014-06-18 (×2): qty 1

## 2014-06-18 NOTE — Progress Notes (Signed)
PT Cancellation Note  Patient Details Name: [name] MRN: [id number] DOB: January 05, 1923   Cancelled Treatment:    Reason Eval/Treat Not Completed: Patient declined, no reason specified (pt became agitated with MD, told MD to let him sleep and to "get the hell out". Will attempt PT later. )   [name] 06/18/2014, 9:55 AM [phone]

## 2014-06-18 NOTE — Evaluation (Signed)
SLP Cancellation Note  Patient Details Name: [name] MRN: [id number] DOB: [birth year]   Cancelled treatment:       Reason Eval/Treat Not Completed: Patient declined, no reason specified   Luanna Salk, [facility] SLP [phone]

## 2014-06-18 NOTE — Progress Notes (Signed)
ANTIBIOTIC CONSULT NOTE - Follow-up  Pharmacy Consult for Vancomycin and Cefepime Indication: pneumonia  Allergies  Allergen Reactions  . Eggs Or Egg-Derived Products Nausea And Vomiting and Rash  . Latex Rash  . Peanuts [Peanut Oil] Anaphylaxis and Swelling    "swelling of the face"  . Penicillins Rash    Tolerates ceftriaxone  . Erythromycin     "I don't remember"  . Fish Allergy     "I never eat fish except for halibut"  . Sulfa Antibiotics Other (See Comments)    Reaction unknown  . Kenalog [Triamcinolone Acetonide] Rash   Patient Measurements: Height: 5\' 8"  (172.7 cm) Weight: 125 lb (56.7 kg) IBW/kg (Calculated) : 68.4  Vital Signs: Temp: 97.8 F (36.6 C) (02/10 0535) Temp Source: Oral (02/09 2112) BP: 119/56 mmHg (02/10 0535) Pulse Rate: 72 (02/10 0535) Intake/Output from previous day: 02/09 0701 - 02/10 0700 In: 120 [P.O.:120] Out: 825 [Urine:825] Intake/Output from this shift:    Labs:  Recent Labs  06/16/14 1428 06/16/14 1721 06/17/14 0535  WBC 10.9* 9.4 8.3  HGB 13.7 12.2* 11.7*  PLT 110* 114* 101*  CREATININE 1.73* 1.64* 1.51*   Estimated Creatinine Clearance: 26.6 mL/min (by C-G formula based on Cr of 1.51). No results for input(s): VANCOTROUGH, VANCOPEAK, VANCORANDOM, GENTTROUGH, GENTPEAK, GENTRANDOM, TOBRATROUGH, TOBRAPEAK, TOBRARND, AMIKACINPEAK, AMIKACINTROU, AMIKACIN in the last 72 hours.   Microbiology: No results found for this or any previous visit (from the past 720 hour(s)).  Medical History: Past Medical History  Diagnosis Date  . COPD (chronic obstructive pulmonary disease)   . GERD (gastroesophageal reflux disease)   . Prostate cancer     "had prostate removed and had chemo" (02/09/2012)  . Depression   . DVT (deep venous thrombosis)   . Alzheimer's disease   . High cholesterol   . Asthma   . Hypertension   . Pneumonia     "4-5 times thru my life" (02/09/2012)  . Chronic bronchitis     "get it q year" (02/09/2012)  .  Exertional dyspnea     "sometimes" (02/09/2012)  . History of blood transfusion     "when prostate removed" (02/09/2012)  . Arthritis     "in my fingers"  (02/09/2012)    Medications:  Anti-infectives    Start     Dose/Rate Route Frequency Ordered Stop   06/17/14 1200  vancomycin (VANCOCIN) IVPB 750 mg/150 ml premix     750 mg 150 mL/hr over 60 Minutes Intravenous Every 24 hours 06/16/14 2031     06/17/14 0400  ceFEPIme (MAXIPIME) 2 g in dextrose 5 % 50 mL IVPB     2 g 100 mL/hr over 30 Minutes Intravenous Every 24 hours 06/16/14 2031     06/16/14 1800  vancomycin (VANCOCIN) IVPB 1000 mg/200 mL premix     1,000 mg 200 mL/hr over 60 Minutes Intravenous  Once 06/16/14 1705 06/16/14 2051   06/16/14 1715  aztreonam (AZACTAM) 2 g in dextrose 5 % 50 mL IVPB     2 g 100 mL/hr over 30 Minutes Intravenous  Once 06/16/14 1708 06/16/14 2022   06/16/14 1645  aztreonam (AZACTAM) 2 g in dextrose 5 % 50 mL IVPB  Status:  Discontinued     2 g 100 mL/hr over 30 Minutes Intravenous 3 times per day 06/16/14 1639 06/16/14 1807     Assessment: 81yo M w/ weakness and SOB unimproved on Levaquin PTA. CXR indicated possible atypical pneumonia. Azactam and Vancomycin single-doses ordered in the ED. Admitted and  pharmacy was asked to dose Vanc and Cefepime. Reports rash w/ penicillins but has tolerated Rocephin.  2/8 >> Cefepime >> 2/8 >> Vanc >>   Tmax: AF WBCs: slightly elevated Renal: SCr improving  -CrCl 27CG, 31N  2/8 blood x 2: sent / sputum: ordered   Goal of Therapy:  Vancomycin trough level 15-20 mcg/ml  Appropriate antibiotic dosing for renal function; eradication of infection  Plan:  Continue Vancomycin 750mg  IV q24 Reduce to Cefepime 1g IV q24h  Measure Vanc trough at steady state. Follow up renal fxn, culture results, and clinical course.  Ralene Bathe, PharmD, BCPS 06/18/2014, 7:37 AM  Pager: [phone]

## 2014-06-18 NOTE — Progress Notes (Addendum)
Clinical Social Work Department CLINICAL SOCIAL WORK PLACEMENT NOTE 06/18/2014  Patient:  MYCAH, FORMICA  Account Number:  [id number] Admit date:  06/16/2014  Clinical Social Worker:  Sindy Messing, LCSW  Date/time:  06/18/2014 11:30 AM  Clinical Social Work is seeking post-discharge placement for this patient at the following level of care:   [place]   (*CSW will update this form in Epic as items are completed)   06/18/2014  Patient/family provided with [place] Department of Clinical Social Work's list of facilities offering this level of care within the geographic area requested by the patient (or if unable, by the patient's family).  06/18/2014  Patient/family informed of their freedom to choose among providers that offer the needed level of care, that participate in Medicare, Medicaid or managed care program needed by the patient, have an available bed and are willing to accept the patient.  06/18/2014  Patient/family informed of [facility]' ownership interest in [facility], as well as of the fact that they are under no obligation to receive care at this facility.  PASARR submitted to EDS on existing # PASARR number received on   FL2 transmitted to all facilities in geographic area requested by pt/family on  06/18/2014 FL2 transmitted to all facilities within larger geographic area on   Patient informed that his/her managed care company has contracts with or will negotiate with  certain facilities, including the following:     Patient/family informed of bed offers received:  06/18/14 Patient chooses bed at  [facility] Physician recommends and patient chooses bed at    Patient to be transferred to [facility] on 06/20/14   Patient to be transferred to facility by [facility] Patient and family notified of transfer on 06/20/14 Name of family member notified:  Dtr Gabriel Cirri) via phone and L/M with wife Rosaria Ferries) via phone  The following physician request were  entered in Epic:   Additional Comments:

## 2014-06-18 NOTE — Progress Notes (Signed)
Clinical Social Work Department BRIEF PSYCHOSOCIAL ASSESSMENT 06/18/2014  Patient:  [name], [name]     Account Number:  [id number]     Admit date:  06/16/2014  Clinical Social Worker:  Earlie Server  Date/Time:  06/18/2014 11:30 AM  Referred by:  Physician  Date Referred:  06/18/2014 Referred for  SNF Placement   Other Referral:   Interview type:  Patient Other interview type:    PSYCHOSOCIAL DATA Living Status:  FAMILY Admitted from facility:   Level of care:   Primary support name:  [name] Primary support relationship to patient:  CHILD, ADULT Degree of support available:   Strong    CURRENT CONCERNS Current Concerns  Post-Acute Placement   Other Concerns:    SOCIAL WORK ASSESSMENT / PLAN CSW received referral to assist with DC planning. CSW reviewed chart and met with patient and wife at bedside. CSW introduced myself and explained role.    Patient laying in bed and minimally engaged. Patient allowed his wife to answer majority of questions. Patient was living at home with wife and wife reports that patient has been weak and needing additional assistance. Wife reports patient went to [facility] in the past and would like to return. CSW provided SNF list and explained process. CSW explained that prior authorization through insurance would need to be obtained prior to DC to SNF. CSW explained if [facility] is unavailable then wife could review list for alternative options. Wife asked that CSW contact dtr [name]) as well. CSW spoke with dtr via phone and explained process as well. Dtr is hopeful for [facility] since patient is familiar with that SNF.    CSW completed FL2 and faxed out. CSW will follow up with bed offers.   Assessment/plan status:  Psychosocial Support/Ongoing Assessment of Needs Other assessment/ plan:   Information/referral to community resources:   SNF information    PATIENT'S/FAMILY'S RESPONSE TO PLAN OF CARE: Patient laying in bed and  not engaged during assessment. Wife reports she is concerned about patient and wishes he would be more motivated to interact with staff. Wife reports she cannot provide as much care for patient as he needs because he is too weak and she feels he needs SNF. Wife reports she is familiar with process and is helpful that patient can go back to [facility] because she felt he received good care there in the past. Dtr engaged during assessment and reports that she is trying to be supportive. Dtr reports that mother is on dialysis and she knows that she needs assistance when caring for patient. Dtr reports she is a good support for patient and wife and will assist as needed. Dtr reports she will talk with mom in order to make decision about SNF placement.       [place], [place] [phone]

## 2014-06-18 NOTE — Evaluation (Addendum)
Occupational Therapy Evaluation Patient Details Name: [name] MRN: [id number] DOB: 03/26/1923 Today's Date: 06/18/2014    History of Present Illness pt was admitted for weakness and SOB.  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  He will benefit from skilled OT to increase safety and independence with adls.  Pt performed a lot of ADLs himself at home and has had a recent decline.  He currently needs mod to max A for LB adls and mod A for transfers.  Goals in acute are for min A.    Follow Up Recommendations  SNF    Equipment Recommendations  3 in 1 bedside comode    Recommendations for Other Services       Precautions / Restrictions Precautions Precautions: Fall Precaution Comments: wife reports no h/o falls Restrictions Weight Bearing Restrictions: No  Although was placed on strict bedrest 2/8, Jen from PT spoke to Dr. Algis Liming, who said it was OK for him to get OOB.     Mobility Bed Mobility Overal bed mobility: Needs Assistance Bed Mobility: Rolling;Sidelying to Sit Rolling: Mod assist      Sit to sidelying: Max assist General bed mobility comments: cues for technique; assist to roll to back  Transfers Overall transfer level: Needs assistance Equipment used: Rolling walker (2 wheeled) Transfers: Sit to/from Stand Sit to Stand: Mod assist Stand pivot transfers: Mod assist       General transfer comment: assist to rise and steady.  Pt shaky during transfer    Balance Overall balance assessment: Needs assistance   Sitting balance-Leahy Scale: Fair       Standing balance-Leahy Scale: Poor                              ADL Overall ADL's : Needs assistance/impaired     Grooming: Wash/dry hands;Set up   Upper Body Bathing: Moderate assistance;Sitting   Lower Body Bathing: Maximal assistance;Sit to/from stand   Upper Body Dressing :  Moderate assistance;Sitting   Lower Body Dressing: Maximal assistance;Sit to/from stand   Toilet Transfer: Moderate assistance;Stand-pivot;RW (from recliner to bed)             General ADL Comments: pt on pureed diet:  poor intake.  Pt sat up for one hour and was fatiqued.     Vision     Perception     Praxis      Pertinent Vitals/Pain Pain Assessment: No/denies pain     Hand Dominance     Extremity/Trunk Assessment Upper Extremity Assessment Upper Extremity Assessment: Generalized weakness      Cervical / Trunk Assessment Cervical / Trunk Assessment: Kyphotic   Communication Communication Communication: HOH   Cognition Arousal/Alertness: Awake/alert Behavior During Therapy: WFL for tasks assessed/performed Overall Cognitive Status: History of cognitive impairments - at baseline                     General Comments       Exercises       Shoulder Instructions      Home Living Family/patient expects to be discharged to:: Skilled nursing facility Living Arrangements: Spouse/significant other Available Help at Discharge: Family (wife goes to dialysis 3x/week, pt goes to adult daycare those days)   Home Access: Level entry     Home Layout: One level  Home Equipment: Simpson - 4 wheels;Shower seat;Cane - single point;Wheelchair - manual;Grab bars - tub/shower          Prior Functioning/Environment Level of Independence: Needs assistance  Gait / Transfers Assistance Needed: independent with rollator, no falls per wife ADL's / Homemaking Assistance Needed: wife assists with bathing/dressing--pt performed a lot himself (min A), assist to transfer into tub shower   Comments: ot has been needing increased assistance.      OT Diagnosis: Generalized weakness   OT Problem List: Decreased strength;Decreased activity tolerance;Decreased cognition;Decreased safety awareness;Impaired balance (sitting and/or standing)   OT  Treatment/Interventions: Self-care/ADL training;DME and/or AE instruction;Balance training;Patient/family education;Therapeutic activities    OT Goals(Current goals can be found in the care plan section) Acute Rehab OT Goals Patient Stated Goal: to get strength back OT Goal Formulation: With patient Time For Goal Achievement: 07/02/14 Potential to Achieve Goals: Good ADL Goals Pt Will Perform Lower Body Bathing: with min assist;sit to/from stand Pt Will Perform Lower Body Dressing: with mod assist;sit to/from stand Pt Will Transfer to Toilet: with min assist;stand pivot transfer;bedside commode Pt Will Perform Toileting - Clothing Manipulation and hygiene: with min assist;sit to/from stand Additional ADL Goal #1: pt will complete UB adls with supervision and cues as needed  OT Frequency: Min 2X/week   Barriers to D/C:            Co-evaluation              End of Session    Activity Tolerance: Patient limited by fatigue Patient left: in bed;with call bell/phone within reach;with bed alarm set;with family/visitor present   Time: 4496-7591 OT Time Calculation (min): 14 min Charges:  OT General Charges $OT Visit: 1 Procedure OT Evaluation $Initial OT Evaluation Tier I: 1 Procedure G-Codes:    Sarrah Fiorenza July 05, 2014, 2:25 PM   Lesle Chris, OTR/L [phone] July 05, 2014

## 2014-06-18 NOTE — Evaluation (Addendum)
Physical Therapy Evaluation Patient Details Name: [name] MRN: [id number] DOB: [birth year] Today's Date: 06/18/2014   History of Present Illness  [name] is an 81 y.o. male with h/o Alzheimers, guillian barre in 1992, COPD, hypertension, depression. Who presents to the hospital complaining of weakness and shortness of breath. This has been going on for the last week.   Clinical Impression  Pt admitted with above diagnosis. Pt currently with functional limitations due to the deficits listed below (see PT Problem List).Pt will benefit from skilled PT to increase their independence and safety with mobility to allow discharge to the venue listed below.    Mod to max assist for bed to recliner transfer. Pt is normally independent walking with a rollator. He is deconditioned and weak, his wife stated he hasn't eaten in 1 week. He would benefit from ST-SNF, wife agreeable.   Noted pt has bed rest order, Dr. Algis [name] gave verbal permission to get pt up OOB.      Follow Up Recommendations SNF    Equipment Recommendations  None recommended by PT    Recommendations for Other Services       Precautions / Restrictions Precautions Precautions: Fall Precaution Comments: wife reports no h/o falls Restrictions Weight Bearing Restrictions: No      Mobility  Bed Mobility Overal bed mobility: Needs Assistance Bed Mobility: Rolling;Sidelying to Sit Rolling: Mod assist Sidelying to sit: Max assist       General bed mobility comments: pt 50%, cues for technique, assist to raise trunk  Transfers Overall transfer level: Needs assistance Equipment used: Rolling walker (2 wheeled) Transfers: Sit to/from [facility] Sit to Stand: Max assist Stand pivot transfers: Mod assist       General transfer comment: assist to scoot to EOB, manual/verbal cues for set up, assist to rise, pt 40%; SPT with RW, pt shaky, took shuffling steps; pt became SOB with activity however  pulse ox did not get a reading, used 2L O2 throughout tx  Ambulation/Gait                Stairs            Wheelchair Mobility    Modified Rankin (Stroke Patients Only)       Balance Overall balance assessment: Needs assistance   Sitting balance-Leahy Scale: Fair       Standing balance-Leahy Scale: Poor                               Pertinent Vitals/Pain Pain Assessment: No/denies pain    Home Living Family/patient expects to be discharged to:: Private residence Living Arrangements: Spouse/significant other Available Help at Discharge: Family (wife goes to dialysis 3x/week, pt goes to adult daycare those days)   Home Access: Level entry     Home Layout: One level Home Equipment: Environmental consultant - 4 wheels;Shower seat;Cane - single point;Wheelchair - manual;Grab bars - tub/shower      Prior Function Level of Independence: Needs assistance   Gait / Transfers Assistance Needed: independent with rollator, no falls per wife  ADL's / Homemaking Assistance Needed: wife assists with bathing/dressing, assist to transfer into tub shower        Hand Dominance        Extremity/Trunk Assessment   Upper Extremity Assessment: Generalized weakness           Lower Extremity Assessment: Generalized weakness (knee extension 4/5 bilaterally)      Cervical /  Trunk Assessment: Kyphotic  Communication   Communication: HOH  Cognition Arousal/Alertness: Awake/alert Behavior During Therapy: WFL for tasks assessed/performed Overall Cognitive Status: History of cognitive impairments - at baseline                      General Comments      Exercises        Assessment/Plan    PT Assessment Patient needs continued PT services  PT Diagnosis Difficulty walking;Generalized weakness   PT Problem List Decreased strength;Decreased mobility;Decreased activity tolerance;Decreased balance;Cardiopulmonary status limiting activity  PT Treatment  Interventions Gait training;Functional mobility training;Therapeutic activities;Patient/family education;Balance training;Therapeutic exercise;DME instruction   PT Goals (Current goals can be found in the Care Plan section) Acute Rehab PT Goals Patient Stated Goal: to get strength back PT Goal Formulation: With family Time For Goal Achievement: 07/02/14 Potential to Achieve Goals: Fair    Frequency Min 3X/week   Barriers to discharge        Co-evaluation               End of Session Equipment Utilized During Treatment: Gait belt;Oxygen Activity Tolerance: Patient limited by fatigue Patient left: in chair;with call bell/phone within reach;with family/visitor present Nurse Communication: Mobility status         Time: 8299-3716 PT Time Calculation (min) (ACUTE ONLY): 29 min   Charges:   PT Evaluation $Initial PT Evaluation Tier I: 1 Procedure PT Treatments $Therapeutic Activity: 8-22 mins   PT G Codes:        [name] 06/18/2014, 1:00 PM [phone]

## 2014-06-18 NOTE — Progress Notes (Signed)
PROGRESS NOTE    [name] MGQ:[id number] DOB: 09/25/1922 DOA: 06/16/2014 PCP: Abigail Miyamoto, MD  HPI/Brief narrative 81 year old male with history of COPD, GERD, prostate cancer, depression, DVT, Alzheimer's, HLD, HTN, presented to ED on 06/16/14 with weakness and dyspnea. Failed outpatient levofloxacin treatment. In the ED, chest x-ray suspicious for atypical pneumonia.   Assessment/Plan:  1. Community acquired pneumonia: Due to atypical nature on chest x-ray, presumptively started on IV vancomycin and cefepime. Blood cultures 2: Negative to date. Improving. Follow chest x-ray in a.m. 2. COPD: Stable. Continue bronchodilators and Symbicort. 3. Acute on stage III chronic kidney disease: Creatinine now at baseline. 4. Dehydration: Resolved. 5. Depression: Continue Zoloft. 6. History of Alzheimer's: Patient not cooperative to interview or exam this morning 7. Anemia and thrombocytopenia: Follow CBC in a.m. Unclear etiology..   Code Status: DO NOT RESUSCITATE Family Communication: None at bedside Disposition Plan: Home in medically stable   Consultants:  None  Procedures:  None  Antibiotics:  IV cefepime and vancomycin.    Subjective: Patient states that he feels okay and did not want to be disturbed this morning. Refusing to answer questions and wanting to go back to sleep.  Objective: Filed Vitals:   06/17/14 2112 06/18/14 0535 06/18/14 0825 06/18/14 1331  BP: 125/59 119/56    Pulse: 77 72    Temp: 98.8 F (37.1 C) 97.8 F (36.6 C)    TempSrc: Oral     Resp: 18 18    Height:      Weight:      SpO2: 100% 97% 98% 95%    Intake/Output Summary (Last 24 hours) at 06/18/14 1812 Last data filed at 06/18/14 0535  Gross per 24 hour  Intake      0 ml  Output    825 ml  Net   -825 ml   Filed Weights   06/16/14 1800  Weight: 56.7 kg (125 lb)     Exam:  General exam: Pleasant elderly male lying comfortably in bed. Respiratory system: Diminished breath  sounds in the bases but otherwise clear to auscultation. No increased work of breathing. Cardiovascular system: S1 & S2 heard, RRR. No JVD, murmurs, gallops, clicks or pedal edema. Gastrointestinal system: Abdomen is nondistended, soft and nontender. Normal bowel sounds heard. Central nervous system: Sleeping but easily arousable and oriented only to self. No focal neurological deficits. Extremities: Symmetric 5 x 5 power.   Data Reviewed: Basic Metabolic Panel:  Recent Labs Lab 06/16/14 1428 06/16/14 1721 06/17/14 0535  NA 138  --  135  K 4.3  --  4.4  CL 105  --  104  CO2 26  --  25  GLUCOSE 118*  --  119*  BUN 30*  --  25*  CREATININE 1.73* 1.64* 1.51*  CALCIUM 9.3  --  8.2*   Liver Function Tests:  Recent Labs Lab 06/16/14 1428  AST 22  ALT 11  ALKPHOS 64  BILITOT 1.1  PROT 7.5  ALBUMIN 4.1   No results for input(s): LIPASE, AMYLASE in the last 168 hours. No results for input(s): AMMONIA in the last 168 hours. CBC:  Recent Labs Lab 06/16/14 1428 06/16/14 1721 06/17/14 0535  WBC 10.9* 9.4 8.3  NEUTROABS 9.4*  --   --   HGB 13.7 12.2* 11.7*  HCT 42.8 39.3 36.5*  MCV 85.6 86.4 85.1  PLT 110* 114* 101*   Cardiac Enzymes: No results for input(s): CKTOTAL, CKMB, CKMBINDEX, TROPONINI in the last 168 hours. BNP (  last 3 results) No results for input(s): PROBNP in the last 8760 hours. CBG: No results for input(s): GLUCAP in the last 168 hours.  Recent Results (from the past 240 hour(s))  Culture, blood (routine x 2) Call MD if unable to obtain prior to antibiotics being given     Status: None (Preliminary result)   Collection Time: 06/16/14  5:21 PM  Result Value Ref Range Status   Specimen Description BLOOD LEFT ANTECUBITAL  Final   Special Requests BOTTLES DRAWN AEROBIC AND ANAEROBIC 3 CC EACH  Final   Culture   Final           BLOOD CULTURE RECEIVED NO GROWTH TO DATE CULTURE WILL BE HELD FOR 5 DAYS BEFORE ISSUING A FINAL NEGATIVE REPORT Performed at  [facility]    Report Status PENDING  Incomplete  Culture, blood (routine x 2) Call MD if unable to obtain prior to antibiotics being given     Status: None (Preliminary result)   Collection Time: 06/16/14  5:36 PM  Result Value Ref Range Status   Specimen Description BLOOD RIGHT ANTECUBITAL  Final   Special Requests BOTTLES DRAWN AEROBIC AND ANAEROBIC 5 CC EACH  Final   Culture   Final           BLOOD CULTURE RECEIVED NO GROWTH TO DATE CULTURE WILL BE HELD FOR 5 DAYS BEFORE ISSUING A FINAL NEGATIVE REPORT Performed at [facility]    Report Status PENDING  Incomplete         Studies: No results found.      Scheduled Meds: . albuterol  2.5 mg Nebulization Q6H  . budesonide-formoterol  2 puff Inhalation BID  . [START ON 06/19/2014] ceFEPime (MAXIPIME) IV  1 g Intravenous Q24H  . galantamine  12 mg Oral BID  . heparin  5,000 Units Subcutaneous 3 times per day  . multivitamin-lutein  1 capsule Oral Daily  . oxybutynin  5 mg Oral BID  . pantoprazole  40 mg Oral Daily  . sertraline  150 mg Oral Daily  . vancomycin  750 mg Intravenous Q24H   Continuous Infusions: . dextrose 5 % and 0.45 % NaCl with KCl 20 mEq/L 75 mL/hr at 06/18/14 1601    Active Problems:   COPD (chronic obstructive pulmonary disease)   CAP (community acquired pneumonia)   ARF (acute renal failure)   Dehydration   Depression    Time spent: 25 minutes.    Vernell Leep, MD, FACP, FHM. [facility] Pager [phone]  If 7PM-7AM, please contact night-coverage [email or web address] Password [email or web address] 06/18/2014, 6:12 PM    LOS: 2 days

## 2014-06-19 ENCOUNTER — Inpatient Hospital Stay (HOSPITAL_COMMUNITY): Payer: PPO

## 2014-06-19 LAB — BASIC METABOLIC PANEL
Anion gap: 7 (ref 5–15)
BUN: 16 mg/dL (ref 6–23)
CO2: 23 mmol/L (ref 19–32)
Calcium: 8.6 mg/dL (ref 8.4–10.5)
Chloride: 109 mmol/L (ref 96–112)
Creatinine, Ser: 1.15 mg/dL (ref 0.50–1.35)
GFR calc Af Amer: 63 mL/min — ABNORMAL LOW (ref >90)
GFR calc non Af Amer: 54 mL/min — ABNORMAL LOW (ref >90)
Glucose, Bld: 106 mg/dL — ABNORMAL HIGH (ref 70–99)
POTASSIUM: 4.2 mmol/L (ref 3.5–5.1)
SODIUM: 139 mmol/L (ref 135–145)

## 2014-06-19 LAB — CBC
HEMATOCRIT: 35.8 % — AB (ref 39.0–52.0)
HEMOGLOBIN: 11.6 g/dL — AB (ref 13.0–17.0)
MCH: 27.4 pg (ref 26.0–34.0)
MCHC: 32.4 g/dL (ref 30.0–36.0)
MCV: 84.4 fL (ref 78.0–100.0)
Platelets: 93 10*3/uL — ABNORMAL LOW (ref 150–400)
RBC: 4.24 MIL/uL (ref 4.22–5.81)
RDW: 16 % — AB (ref 11.5–15.5)
WBC: 6.4 10*3/uL (ref 4.0–10.5)

## 2014-06-19 MED ORDER — IOHEXOL 300 MG/ML  SOLN
80.0000 mL | Freq: Once | INTRAMUSCULAR | Status: AC | PRN
  Administered 2014-06-19: 80 mL via INTRAVENOUS

## 2014-06-19 NOTE — Progress Notes (Addendum)
PROGRESS NOTE    [name] ZJI:[id number] DOB: 25-Jul-1922 DOA: 06/16/2014 PCP: Abigail Miyamoto, MD  HPI/Brief narrative 81 year old male with history of COPD, GERD, prostate cancer, depression, DVT, Alzheimer's, HLD, HTN, presented to ED on 06/16/14 with weakness and dyspnea. Failed outpatient levofloxacin treatment. In the ED, chest x-ray suspicious for atypical pneumonia.   Assessment/Plan:  1. Community acquired pneumonia: Due to atypical nature on chest x-ray, presumptively started on IV vancomycin and cefepime. Blood cultures 2: Negative to date. Improving. Chest x-ray: report as below. BCx NGT > DC Vancomycin. Will get CT chest with contrast as recommended to further evaluate-? ILD/PNA/edema/chronic aspiration . 2. COPD: Stable. Continue bronchodilators and Symbicort. 3. Acute on stage III chronic kidney disease: Creatinine has normalized. 4. Dehydration: Resolved. 5. Depression: Continue Zoloft. 6. History of Alzheimer's: Patient doing much better this morning and fully coherent. 7. Anemia and thrombocytopenia: Follow CBC in a.m. Unclear etiology.. Stable.   Code Status: DO NOT RESUSCITATE Family Communication: Discussed with spouse on 06/19/14. Disposition Plan: Home when medically stable   Consultants:  None  Procedures:  None  Antibiotics:  IV cefepime  IV vancomycin-DC'd   Subjective: Denies complaints. Denies cough or dyspnea. Former smoker but has not smoked in 35+ years. Denies exposure to industrial chemicals.  Objective: Filed Vitals:   06/19/14 0801 06/19/14 1021 06/19/14 1536 06/19/14 1725  BP:    124/65  Pulse:    90  Temp:    98.3 F (36.8 C)  TempSrc:    Oral  Resp:    16  Height:      Weight:      SpO2: 93% 95% 94% 92%    Intake/Output Summary (Last 24 hours) at 06/19/14 1741 Last data filed at 06/19/14 1704  Gross per 24 hour  Intake      0 ml  Output   1175 ml  Net  -1175 ml   Filed Weights   06/16/14 1800  Weight: 56.7 kg  (125 lb)     Exam:  General exam: Pleasant elderly male lying comfortably in bed. Respiratory system: Diminished breath sounds in the bases but otherwise clear to auscultation. No increased work of breathing. Cardiovascular system: S1 & S2 heard, RRR. No JVD, murmurs, gallops, clicks or pedal edema. Gastrointestinal system: Abdomen is nondistended, soft and nontender. Normal bowel sounds heard. Central nervous system: Sleeping but easily arousable and oriented only to self. No focal neurological deficits. Extremities: Symmetric 5 x 5 power.   Data Reviewed: Basic Metabolic Panel:  Recent Labs Lab 06/16/14 1428 06/16/14 1721 06/17/14 0535 06/19/14 0450  NA 138  --  135 139  K 4.3  --  4.4 4.2  CL 105  --  104 109  CO2 26  --  25 23  GLUCOSE 118*  --  119* 106*  BUN 30*  --  25* 16  CREATININE 1.73* 1.64* 1.51* 1.15  CALCIUM 9.3  --  8.2* 8.6   Liver Function Tests:  Recent Labs Lab 06/16/14 1428  AST 22  ALT 11  ALKPHOS 64  BILITOT 1.1  PROT 7.5  ALBUMIN 4.1   No results for input(s): LIPASE, AMYLASE in the last 168 hours. No results for input(s): AMMONIA in the last 168 hours. CBC:  Recent Labs Lab 06/16/14 1428 06/16/14 1721 06/17/14 0535 06/19/14 0450  WBC 10.9* 9.4 8.3 6.4  NEUTROABS 9.4*  --   --   --   HGB 13.7 12.2* 11.7* 11.6*  HCT 42.8 39.3 36.5* 35.8*  MCV 85.6 86.4 85.1 84.4  PLT 110* 114* 101* 93*   Cardiac Enzymes: No results for input(s): CKTOTAL, CKMB, CKMBINDEX, TROPONINI in the last 168 hours. BNP (last 3 results) No results for input(s): PROBNP in the last 8760 hours. CBG: No results for input(s): GLUCAP in the last 168 hours.  Recent Results (from the past 240 hour(s))  Culture, blood (routine x 2) Call MD if unable to obtain prior to antibiotics being given     Status: None (Preliminary result)   Collection Time: 06/16/14  5:21 PM  Result Value Ref Range Status   Specimen Description BLOOD LEFT ANTECUBITAL  Final   Special  Requests BOTTLES DRAWN AEROBIC AND ANAEROBIC 3 CC EACH  Final   Culture   Final           BLOOD CULTURE RECEIVED NO GROWTH TO DATE CULTURE WILL BE HELD FOR 5 DAYS BEFORE ISSUING A FINAL NEGATIVE REPORT Performed at [facility]    Report Status PENDING  Incomplete  Culture, blood (routine x 2) Call MD if unable to obtain prior to antibiotics being given     Status: None (Preliminary result)   Collection Time: 06/16/14  5:36 PM  Result Value Ref Range Status   Specimen Description BLOOD RIGHT ANTECUBITAL  Final   Special Requests BOTTLES DRAWN AEROBIC AND ANAEROBIC 5 CC EACH  Final   Culture   Final           BLOOD CULTURE RECEIVED NO GROWTH TO DATE CULTURE WILL BE HELD FOR 5 DAYS BEFORE ISSUING A FINAL NEGATIVE REPORT Performed at [facility]    Report Status PENDING  Incomplete         Studies: Dg Chest 2 View  06/19/2014   CLINICAL DATA:  Pneumonia. Chronic interstitial lung disease. Prostate carcinoma.  EXAM: CHEST  2 VIEW  COMPARISON:  06/16/2014 and 06/20/2013  FINDINGS: Coarse pulmonary opacity is seen in the peripheral lung fields predominantly in the lung bases which shows some interval worsening. This may be due to progression of chronic interstitial lung disease, or superimposed pneumonia or less likely edema.  Ill-defined nodular opacities are again seen in both midlung fields which may be inflammatory or neoplastic.  No evidence pleural effusion.  Heart size is within normal limits.  IMPRESSION: Coarse pulmonary opacity with bibasilar predominance, and some interval worsening. This may be due to progression of chronic interstitial lung disease, or superimposed acute process such as pneumonia or less likely edema.  Ill-defined nodular opacities in both mid lung fields, which may be inflammatory or neoplastic. Chest CT is recommended for further evaluation.   Electronically Signed   By: Earle Gell M.D.   On: 06/19/2014 08:58        Scheduled Meds: .  albuterol  2.5 mg Nebulization Q6H  . budesonide-formoterol  2 puff Inhalation BID  . ceFEPime (MAXIPIME) IV  1 g Intravenous Q24H  . galantamine  12 mg Oral BID  . heparin  5,000 Units Subcutaneous 3 times per day  . multivitamin-lutein  1 capsule Oral Daily  . oxybutynin  5 mg Oral BID  . pantoprazole  40 mg Oral Daily  . sertraline  150 mg Oral Daily  . vancomycin  750 mg Intravenous Q24H   Continuous Infusions:    Active Problems:   COPD (chronic obstructive pulmonary disease)   CAP (community acquired pneumonia)   ARF (acute renal failure)   Dehydration   Depression    Time spent: 25 minutes.  Vernell Leep, MD, FACP, FHM. [facility] Pager [phone]  If 7PM-7AM, please contact night-coverage [email or web address] Password [email or web address] 06/19/2014, 5:41 PM    LOS: 3 days

## 2014-06-19 NOTE — Evaluation (Signed)
Clinical/Bedside Swallow Evaluation Patient Details  Name: [name] MRN: [id number] Date of Birth: Sep 08, 1922  Today's Date: 06/19/2014 Time: SLP Start Time (ACUTE ONLY): 0830 SLP Stop Time (ACUTE ONLY): 0903 SLP Time Calculation (min) (ACUTE ONLY): 33 min  Past Medical History:  Past Medical History  Diagnosis Date  . COPD (chronic obstructive pulmonary disease)   . GERD (gastroesophageal reflux disease)   . Prostate cancer     "had prostate removed and had chemo" (02/09/2012)  . Depression   . DVT (deep venous thrombosis)   . Alzheimer's disease   . High cholesterol   . Asthma   . Hypertension   . Pneumonia     "4-5 times thru my life" (02/09/2012)  . Chronic bronchitis     "get it q year" (02/09/2012)  . Exertional dyspnea     "sometimes" (02/09/2012)  . History of blood transfusion     "when prostate removed" (02/09/2012)  . Arthritis     "in my fingers"  (02/09/2012)   Past Surgical History:  Past Surgical History  Procedure Laterality Date  . Tonsillectomy  ~ 1938  . Appendectomy  1950's?  . Cataract extraction w/ intraocular lens  implant, bilateral    . Prostatectomy    . Tear duct probing      "have had it done twice; left eye only?" (02/09/2012)  . Eye surgery     HPI:  [name] is an 81 y.o. male with h/o Alzheimers, guillian barre in 1992, COPD, hypertension, GERD, pna, bronchitis, porstate cancer, HTN, depression, dysphagia admitted with recent weakness/shortness of breath - diagnosed with HCAP.      Assessment / Plan / Recommendation Clinical Impression  Pt presents with ongoing symptoms of dysphagia revealed during MBS from 06/2013.  Today pt without overt indications of aspiration (silent aspiration on prior MBS).  Oral coordination difficulties noted with delayed transiting. SLP observed pt consuming applesauce, orange juice and softened cracker. Pt has upper dentures not in room currently but reports he eats without them at times.  Multiple swallows  noted with liquids and immediate belching without pt reporting regurgitation.    Pt does admit to occasional issues with nasal regurgitation with liquids but did not expand on information except to say this does not happen often.    Pt is excessively xerostomic (reddened oral tissues) and suspect this contributes to his dysphagia.  Pt also became mildly dyspenic with intake which may lead to episodic aspiration.    Realistically suspect component of chronic aspiration that this pt manages when well.  Given pt's cognitive disease, he will likely not follow compensation strategies.  Also with concern for dehydration upon admit, continuing thin liquid may be feasible option, monitoring tolerance closely.   Educated pt to recommendations using teach back and provided written strategies.    Given pt's dementia, will follow up x1 for family education as no family present at this time.    Defer to MD to determine indication for MBS, but doubt would change pt's care plan.      Aspiration Risk  Moderate    Diet Recommendation Dysphagia 3 (Mechanical Soft);Thin liquid   Liquid Administration via: Cup;No straw Medication Administration: Whole meds with puree Supervision: Patient able to self feed;Intermittent supervision to cue for compensatory strategies Compensations: Slow rate;Small sips/bites Postural Changes and/or Swallow Maneuvers: Seated upright 90 degrees;Upright 30-60 min after meal (stop intake if pt short of breath or coughing)    Other  Recommendations Oral Care Recommendations: Oral care BID  Follow Up Recommendations   (tbd)    Frequency and Duration min 1 x/week  1 week   Pertinent Vitals/Pain Afebrile, decrease     Swallow Study Prior Functional Status   pt reports eating solids at home with and without upper denture, he denies dysphagia    General Date of Onset: 06/19/14 HPI: [name] is an 81 y.o. male with h/o Alzheimers, guillian barre in 1992, COPD, hypertension,  GERD, pna, bronchitis, prostate cancer, HTN, depression, dysphagia admitted with recent weakness/shortness of breath - diagnosed with HCAP.    Type of Study: Bedside swallow evaluation Previous Swallow Assessment: MBS 06/22/2013 moderate oropharyngeal dysphagia with minimal silent aspiration - dys1/thin with chin tuck Diet Prior to this Study: Dysphagia 1 (puree);Thin liquids Temperature Spikes Noted: No Respiratory Status: Room air Behavior/Cognition: Alert;Cooperative;Pleasant mood Oral Cavity - Dentition: Edentulous;Dentures, not available (only upper denture) Self-Feeding Abilities: Able to feed self Patient Positioning: Upright in bed Baseline Vocal Quality: Clear Volitional Cough:  (dnt) Volitional Swallow: Able to elicit    Oral/Motor/Sensory Function Overall Oral Motor/Sensory Function: Appears within functional limits for tasks assessed   Ice Chips Ice chips: Not tested   Thin Liquid Thin Liquid: Impaired Presentation: Cup;Self Fed Oral Phase Impairments: Reduced lingual movement/coordination Pharyngeal  Phase Impairments: Multiple swallows Other Comments: belching noted after swallow    Nectar Thick Nectar Thick Liquid: Not tested   Honey Thick Honey Thick Liquid: Not tested   Puree Puree: Within functional limits Presentation: Self Fed;Spoon Oral Phase Impairments: Reduced lingual movement/coordination   Solid   GO    Solid: Impaired Presentation: Self Fed Oral Phase Impairments: Reduced lingual movement/coordination;Impaired anterior to posterior transit;Impaired mastication Pharyngeal Phase Impairments: Suspected delayed Swallow Other Comments: softened graham cracker       Luanna Salk, [facility] SLP [phone]

## 2014-06-20 ENCOUNTER — Inpatient Hospital Stay (HOSPITAL_COMMUNITY): Payer: PPO

## 2014-06-20 DIAGNOSIS — R1310 Dysphagia, unspecified: Secondary | ICD-10-CM

## 2014-06-20 DIAGNOSIS — J690 Pneumonitis due to inhalation of food and vomit: Principal | ICD-10-CM

## 2014-06-20 DIAGNOSIS — J470 Bronchiectasis with acute lower respiratory infection: Secondary | ICD-10-CM

## 2014-06-20 LAB — CBC
HCT: 38.6 % — ABNORMAL LOW (ref 39.0–52.0)
Hemoglobin: 12.3 g/dL — ABNORMAL LOW (ref 13.0–17.0)
MCH: 26.6 pg (ref 26.0–34.0)
MCHC: 31.9 g/dL (ref 30.0–36.0)
MCV: 83.5 fL (ref 78.0–100.0)
Platelets: 126 10*3/uL — ABNORMAL LOW (ref 150–400)
RBC: 4.62 MIL/uL (ref 4.22–5.81)
RDW: 15.8 % — AB (ref 11.5–15.5)
WBC: 7.3 10*3/uL (ref 4.0–10.5)

## 2014-06-20 LAB — BASIC METABOLIC PANEL
ANION GAP: 9 (ref 5–15)
BUN: 18 mg/dL (ref 6–23)
CO2: 24 mmol/L (ref 19–32)
Calcium: 9 mg/dL (ref 8.4–10.5)
Chloride: 104 mmol/L (ref 96–112)
Creatinine, Ser: 1.25 mg/dL (ref 0.50–1.35)
GFR calc Af Amer: 57 mL/min — ABNORMAL LOW (ref >90)
GFR calc non Af Amer: 49 mL/min — ABNORMAL LOW (ref >90)
GLUCOSE: 104 mg/dL — AB (ref 70–99)
Potassium: 4.4 mmol/L (ref 3.5–5.1)
SODIUM: 137 mmol/L (ref 135–145)

## 2014-06-20 MED ORDER — CEPHALEXIN 500 MG PO CAPS
500.0000 mg | ORAL_CAPSULE | Freq: Three times a day (TID) | ORAL | Status: DC
  Filled 2014-06-20 (×3): qty 1

## 2014-06-20 MED ORDER — METRONIDAZOLE 500 MG PO TABS
500.0000 mg | ORAL_TABLET | Freq: Three times a day (TID) | ORAL | Status: DC
  Filled 2014-06-20 (×3): qty 1

## 2014-06-20 MED ORDER — CEPHALEXIN 500 MG PO CAPS: 500.0000 mg | ORAL_CAPSULE | Freq: Two times a day (BID) | ORAL | Status: DC

## 2014-06-20 MED ORDER — METRONIDAZOLE 500 MG PO TABS: 500.0000 mg | ORAL_TABLET | Freq: Three times a day (TID) | ORAL | Status: DC

## 2014-06-20 MED ORDER — METRONIDAZOLE 500 MG PO TABS
500.0000 mg | ORAL_TABLET | Freq: Three times a day (TID) | ORAL | Status: DC
  Administered 2014-06-20: 500 mg via ORAL
  Filled 2014-06-20 (×3): qty 1

## 2014-06-20 MED ORDER — CEPHALEXIN 500 MG PO CAPS
500.0000 mg | ORAL_CAPSULE | Freq: Two times a day (BID) | ORAL | Status: DC
  Administered 2014-06-20: 500 mg via ORAL
  Filled 2014-06-20 (×2): qty 1

## 2014-06-20 NOTE — Progress Notes (Signed)
Gave report to Kathlee Nations at [facility]. Left number is she had additional questions.

## 2014-06-20 NOTE — Discharge Instructions (Signed)
Aspiration Pneumonia °Aspiration pneumonia is an infection in your lungs. It occurs when food, liquid, or stomach contents (vomit) are inhaled (aspirated) into your lungs. When these things get into your lungs, swelling (inflammation) and infection can occur. This can make it difficult for you to breathe. Aspiration pneumonia is a serious condition and can be life threatening. °RISK FACTORS °Aspiration pneumonia is more likely to occur when a person's cough (gag) reflex or ability to swallow has been decreased. Some things that can do this include:  °· Having a brain injury or disease, such as stroke, seizures, Parkinson's disease, dementia, or amyotrophic lateral sclerosis (ALS).   °· Being given general anesthetic for procedures.   °· Being in a coma (unconscious).   °· Having a narrowing of the tube that carries food to the stomach (esophagus).   °· Drinking too much alcohol. If a person passes out and vomits, vomit can be swallowed into the lungs.   °· Taking certain medicines, such as tranquilizers or sedatives.   °SIGNS AND SYMPTOMS  °· Coughing after swallowing food or liquids.   °· Breathing problems, such as wheezing or shortness of breath.   °· Bluish skin. This can be caused by lack of oxygen.   °· Coughing up food or mucus. The mucus might contain blood, greenish material, or yellowish-white fluid (pus).   °· Fever.   °· Chest pain.   °· Being more tired than usual (fatigue).   °· Sweating more than usual.   °· Bad breath.   °DIAGNOSIS  °A physical exam will be done. During the exam, the health care provider will listen to your lungs with a stethoscope to check for:  °· Crackling sounds in the lungs. °· Decreased breath sounds. °· A rapid heartbeat. °Various tests may be ordered. These may include:  °· Chest X-ray.   °· CT scan.   °· Swallowing study. This test looks at how food is swallowed and whether it goes into your breathing tube (trachea) or food pipe (esophagus).   °· Sputum culture. Saliva and  mucus (sputum) are collected from the lungs or the tubes that carry air to the lungs (bronchi). The sputum is then tested for bacteria.   °· Bronchoscopy. This test uses a flexible tube (bronchoscope) to see inside the lungs. °TREATMENT  °Treatment will usually include antibiotic medicines. Other medicines may also be used to reduce fever or pain. You may need to be treated in the hospital. In the hospital, your breathing will be carefully monitored. Depending on how well you are breathing, you may need to be given oxygen, or you may need breathing support from a breathing machine (ventilator). For people who fail a swallowing study, a feeding tube might be placed in the stomach, or they may be asked to avoid certain food textures or liquids when they eat. °HOME CARE INSTRUCTIONS  °· Carefully follow any special eating instructions you were given, such as avoiding certain food textures or thickening liquids. This reduces the risk of developing aspiration pneumonia again.  °· Only take over-the-counter or prescription medicines as directed by your health care provider. Follow the directions carefully.   °· If you were prescribed antibiotics, take them as directed. Finish them even if you start to feel better.   °· Rest as instructed by your health care provider.   °· Keep all follow-up appointments with your health care provider.   °SEEK MEDICAL CARE IF:  °· You develop worsening shortness of breath, wheezing, or difficulty breathing.   °· You develop a fever.   °· You have chest pain.   °MAKE SURE YOU:  °· Understand these instructions. °· Will watch your   condition. °· Will get help right away if you are not doing well or get worse. °Document Released: 02/20/2009 Document Revised: 04/30/2013 Document Reviewed: 10/11/2012 °[employer]® Patient Information ©2015 [employer]. This information is not intended to replace advice given to you by your health care provider. Make sure you discuss any questions you have with  your health care provider. ° °

## 2014-06-20 NOTE — Progress Notes (Signed)
ANTIBIOTIC CONSULT NOTE - Follow-up  Pharmacy Consult for Vancomycin and Cefepime Indication: pneumonia  Allergies  Allergen Reactions  . Eggs Or Egg-Derived Products Nausea And Vomiting and Rash  . Latex Rash  . Peanuts [Peanut Oil] Anaphylaxis and Swelling    "swelling of the face"  . Penicillins Rash    Tolerates ceftriaxone  . Erythromycin     "I don't remember"  . Fish Allergy     "I never eat fish except for halibut"  . Sulfa Antibiotics Other (See Comments)    Reaction unknown  . Kenalog [Triamcinolone Acetonide] Rash   Patient Measurements: Height: 5\' 8"  (172.7 cm) Weight: 125 lb (56.7 kg) IBW/kg (Calculated) : 68.4  Vital Signs: Temp: 97.7 F (36.5 C) (02/12 0531) Temp Source: Oral (02/12 0531) BP: 123/63 mmHg (02/12 0531) Pulse Rate: 74 (02/12 0531) Intake/Output from previous day: 02/11 0701 - 02/12 0700 In: 240 [P.O.:240] Out: 825 [Urine:825] Intake/Output from this shift:    Labs:  Recent Labs  06/19/14 0450 06/20/14 0553  WBC 6.4 7.3  HGB 11.6* 12.3*  PLT 93* 126*  CREATININE 1.15 1.25   Estimated Creatinine Clearance: 32.1 mL/min (by C-G formula based on Cr of 1.25). No results for input(s): VANCOTROUGH, VANCOPEAK, VANCORANDOM, GENTTROUGH, GENTPEAK, GENTRANDOM, TOBRATROUGH, TOBRAPEAK, TOBRARND, AMIKACINPEAK, AMIKACINTROU, AMIKACIN in the last 72 hours.   Microbiology: Recent Results (from the past 720 hour(s))  Culture, blood (routine x 2) Call MD if unable to obtain prior to antibiotics being given     Status: None (Preliminary result)   Collection Time: 06/16/14  5:21 PM  Result Value Ref Range Status   Specimen Description BLOOD LEFT ANTECUBITAL  Final   Special Requests BOTTLES DRAWN AEROBIC AND ANAEROBIC 3 CC EACH  Final   Culture   Final           BLOOD CULTURE RECEIVED NO GROWTH TO DATE CULTURE WILL BE HELD FOR 5 DAYS BEFORE ISSUING A FINAL NEGATIVE REPORT Performed at [facility]    Report Status PENDING  Incomplete   Culture, blood (routine x 2) Call MD if unable to obtain prior to antibiotics being given     Status: None (Preliminary result)   Collection Time: 06/16/14  5:36 PM  Result Value Ref Range Status   Specimen Description BLOOD RIGHT ANTECUBITAL  Final   Special Requests BOTTLES DRAWN AEROBIC AND ANAEROBIC 5 CC EACH  Final   Culture   Final           BLOOD CULTURE RECEIVED NO GROWTH TO DATE CULTURE WILL BE HELD FOR 5 DAYS BEFORE ISSUING A FINAL NEGATIVE REPORT Performed at [facility]    Report Status PENDING  Incomplete    Medical History: Past Medical History  Diagnosis Date  . COPD (chronic obstructive pulmonary disease)   . GERD (gastroesophageal reflux disease)   . Prostate cancer     "had prostate removed and had chemo" (02/09/2012)  . Depression   . DVT (deep venous thrombosis)   . Alzheimer's disease   . High cholesterol   . Asthma   . Hypertension   . Pneumonia     "4-5 times thru my life" (02/09/2012)  . Chronic bronchitis     "get it q year" (02/09/2012)  . Exertional dyspnea     "sometimes" (02/09/2012)  . History of blood transfusion     "when prostate removed" (02/09/2012)  . Arthritis     "in my fingers"  (02/09/2012)    Medications:  Anti-infectives  Start     Dose/Rate Route Frequency Ordered Stop   06/19/14 0400  ceFEPIme (MAXIPIME) 1 g in dextrose 5 % 50 mL IVPB     1 g 100 mL/hr over 30 Minutes Intravenous Every 24 hours 06/18/14 0739     06/17/14 1200  vancomycin (VANCOCIN) IVPB 750 mg/150 ml premix  Status:  Discontinued     750 mg 150 mL/hr over 60 Minutes Intravenous Every 24 hours 06/16/14 2031 06/19/14 1749   06/17/14 0400  ceFEPIme (MAXIPIME) 2 g in dextrose 5 % 50 mL IVPB  Status:  Discontinued     2 g 100 mL/hr over 30 Minutes Intravenous Every 24 hours 06/16/14 2031 06/18/14 0739   06/16/14 1800  vancomycin (VANCOCIN) IVPB 1000 mg/200 mL premix     1,000 mg 200 mL/hr over 60 Minutes Intravenous  Once 06/16/14 1705 06/16/14 2051    06/16/14 1715  aztreonam (AZACTAM) 2 g in dextrose 5 % 50 mL IVPB     2 g 100 mL/hr over 30 Minutes Intravenous  Once 06/16/14 1708 06/16/14 2022   06/16/14 1645  aztreonam (AZACTAM) 2 g in dextrose 5 % 50 mL IVPB  Status:  Discontinued     2 g 100 mL/hr over 30 Minutes Intravenous 3 times per day 06/16/14 1639 06/16/14 1807     Assessment: 81yo M w/ weakness and SOB unimproved on Levaquin PTA. CXR indicated possible atypical pneumonia. Azactam and Vancomycin single-doses ordered in the ED. Admitted and pharmacy was asked to dose Vanc and Cefepime. Reports rash w/ penicillins but has tolerated Rocephin.  Antibiotics 2/8 >> Cefepime >> 2/8 >> Vanc >> 2/11  Vitals/Labs Tmax: AF WBCs: WNL Renal: ARF improved - SCr 1.25 sl bump overnight, CrCl 32  Microbiology 2/8 blood x 2: NGTD 2/11 strep/legionella neg   Drug level / dose changes info: 2/10 Reduce cefepime 2g q24h -->1g q24h  Goal of Therapy:  Eradication of infection Appropriate antibiotic dosing for renal function; eradication of infection  Plan:  Continue cefepime 1g IV q24h.  F/u renal fxn and adjust dose as warranted.    Ralene Bathe, PharmD, BCPS 06/20/2014, 8:10 AM  Pager: [phone]

## 2014-06-20 NOTE — Discharge Summary (Addendum)
Physician Discharge Summary  [name] MBW:[id number] DOB: [birth year] DOA: 06/16/2014  PCP: Abigail Miyamoto, MD  Admit date: 06/16/2014 Discharge date: 06/20/2014  Time spent: Greater than 30 minutes  Recommendations for Outpatient Follow-up:  1. M.D. at SNF 3 days with repeat labs (CBC & BMP). Please follow final blood culture results that were sent from the hospital. 2. Dr. Simonne Maffucci, Pulmonology in 2 weeks. 3. Dr. Briscoe Deutscher, PCP upon discharge from SNF.  Discharge Diagnoses:  Active Problems:   COPD (chronic obstructive pulmonary disease)   CAP (community acquired pneumonia)   ARF (acute renal failure)   Dehydration   Depression   Discharge Condition: Improved & Stable  Diet recommendation: Dysphagia 3 (Mechanical Soft);Thin liquid   Filed Weights   06/16/14 1800  Weight: 56.7 kg (125 lb)    History of present illness:  81 year old male with history of COPD, GERD, prostate cancer, depression, DVT, Alzheimer's, HLD, HTN, presented to ED on 06/16/14 with weakness and dyspnea. Failed outpatient levofloxacin treatment. In the ED, chest x-ray suspicious for atypical pneumonia.  Hospital Course:   1. Right lower lobe bronchiectasis/? Chronic aspiration with superimposed pneumonia: Patient was empirically started on IV vancomycin and cefepime. Blood cultures 2: Negative to date. Patient clinically improved. Repeat chest x-ray had multiple significant findings as below including interstitial lung disease. CT chest with contrast obtained which shows RLL bronchiectasis and concern for aspiration. Discussed with PCCM MD on call: recommended completing a 10 day course of Abx, barium swallow (done and negative) and OP follow up with them to consider further evaluation. BAL would not be helpful given that patient has been on antibiotics but may be considered outpatient to evaluate possible MAI. Patient denies chronic cough, fevers or night sweats. Augmentin would twice a day but  patient allergic to penicillins. Patient has tolerated ceftriaxone in past as per pharmacist and hence will switch to oral Keflex and Flagyl to complete total 10 days course of antibiotics. HIV antibody screen: Negative. Urine Legionella and pneumococcal antigen: Negative. Speech therapist evaluated and made dietary modifications as below.  2. COPD: Stable. 3. Acute on chronic stage III kidney disease: Creatinine improved. Follow BMP at SNF. 4. Dehydration: Resolved. 5. Depression: Continue Zoloft. 6. Alzheimer's disease: Patient has been coherent to my interview over the last 2 days. 7. Anemia and thrombocytopenia: Stable. Outpatient follow-up of CBCs. 8. CODE STATUS: As per discussion with patient's daughter/healthcare power of attorney, patient has DO NOT RESUSCITATE form at home and has been DO NOT RESUSCITATE since admission to the hospital. However today, the SNF staff discussed with patient and he wished to have chest compressions done. CODE STATUS can be further addressed at SNF. 9. Bilateral pulmonary nodules: Stable per CT chest. Outpatient follow-up is deemed necessary.  10. T12 vertebral body fracture: Stable per CT findings. No reported pain. 11. Cholelithiasis: Per CT. No GI symptoms. 12. Dysphagia: Speech therapy evaluated and made diet recommendations as above.  Consultations:  None  Procedures:  None    Discharge Exam:  Complaints:  Patient denies complaints. Denies cough or dyspnea.  Filed Vitals:   06/19/14 2044 06/19/14 2055 06/20/14 0531 06/20/14 0853  BP: 108/62  123/63   Pulse: 84  74   Temp: 98.2 F (36.8 C)  97.7 F (36.5 C)   TempSrc: Oral  Oral   Resp: 20  16   Height:      Weight:      SpO2: 93% 94% 91% 92%    General exam: Pleasant  elderly male lying comfortably in bed. Respiratory system: Diminished breath sounds in the bases with few coarse leathery crackles right >left, but otherwise clear to auscultation. No increased work of  breathing. Cardiovascular system: S1 & S2 heard, RRR. No JVD, murmurs, gallops, clicks or pedal edema. Gastrointestinal system: Abdomen is nondistended, soft and nontender. Normal bowel sounds heard. Central nervous system: Sleeping but easily arousable and oriented 3. No focal neurological deficits. Extremities: Symmetric 5 x 5 power.  Discharge Instructions      Discharge Instructions    Call MD for:  difficulty breathing, headache or visual disturbances    Complete by:  As directed      Call MD for:  extreme fatigue    Complete by:  As directed      Call MD for:  persistant dizziness or light-headedness    Complete by:  As directed      Call MD for:  temperature >100.4    Complete by:  As directed      Discharge instructions    Complete by:  As directed   DIET: Dysphagia 3 (Mechanical Soft);Thin liquid     Increase activity slowly    Complete by:  As directed             Medication List    STOP taking these medications        amLODipine 5 MG tablet  Commonly known as:  NORVASC     guaiFENesin-dextromethorphan 100-10 MG/5ML syrup  Commonly known as:  ROBITUSSIN DM     levofloxacin 500 MG tablet  Commonly known as:  LEVAQUIN     mirtazapine 15 MG tablet  Commonly known as:  REMERON     polyethylene glycol packet  Commonly known as:  MIRALAX / GLYCOLAX     SYSTANE NIGHTTIME OP     triamcinolone 0.025 % cream  Commonly known as:  KENALOG      TAKE these medications        albuterol (5 MG/ML) 0.5% nebulizer solution  Commonly known as:  PROVENTIL  Take 0.5 mLs (2.5 mg total) by nebulization every 2 (two) hours as needed for wheezing.     PROVENTIL HFA 108 (90 BASE) MCG/ACT inhaler  Generic drug:  albuterol  INHALE 2 PUFFS BY MOUTH EVERY 6 HOURS AS NEEDED     APAP 325 MG tablet  Take 650 mg by mouth every 6 (six) hours as needed for pain.     aspirin 81 MG chewable tablet  Chew 81 mg by mouth daily. For prophylaxis.     budesonide-formoterol 80-4.5  MCG/ACT inhaler  Commonly known as:  SYMBICORT  Inhale 2 puffs into the lungs 2 (two) times daily. For COPD     cephALEXin 500 MG capsule  Commonly known as:  KEFLEX  Take 1 capsule (500 mg total) by mouth every 12 (twelve) hours. Take through 06/25/14 doses then discontinue.     cholecalciferol 1000 UNITS tablet  Commonly known as:  VITAMIN D  Take 1,000 Units by mouth daily. For supplement     feeding supplement (ENSURE COMPLETE) Liqd  Take 237 mLs by mouth 3 (three) times daily between meals.     galantamine 12 MG tablet  Commonly known as:  RAZADYNE  Take 12 mg by mouth 2 (two) times daily. For dementia     metroNIDAZOLE 500 MG tablet  Commonly known as:  FLAGYL  Take 1 tablet (500 mg total) by mouth every 8 (eight) hours. Take through 06/25/14 doses then discontinue.  multivitamin-lutein Caps capsule  Take 1 capsule by mouth daily.     omeprazole 20 MG capsule  Commonly known as:  PRILOSEC  Take 20 mg by mouth daily. For GERD     ondansetron 4 MG tablet  Commonly known as:  ZOFRAN  Take 4 mg by mouth 2 (two) times daily as needed for nausea or vomiting.     oxybutynin 5 MG tablet  Commonly known as:  DITROPAN  Take 5 mg by mouth 2 (two) times daily. For overactive bladder     polyvinyl alcohol 1.4 % ophthalmic solution  Commonly known as:  LIQUIFILM TEARS  Place 1 drop into both eyes as needed for dry eyes.     sertraline 100 MG tablet  Commonly known as:  ZOLOFT  Take 150 mg by mouth daily. For depression.          The results of significant diagnostics from this hospitalization (including imaging, microbiology, ancillary and laboratory) are listed below for reference.    Significant Diagnostic Studies: Dg Chest 2 View  06/19/2014   CLINICAL DATA:  Pneumonia. Chronic interstitial lung disease. Prostate carcinoma.  EXAM: CHEST  2 VIEW  COMPARISON:  06/16/2014 and 06/20/2013  FINDINGS: Coarse pulmonary opacity is seen in the peripheral lung fields  predominantly in the lung bases which shows some interval worsening. This may be due to progression of chronic interstitial lung disease, or superimposed pneumonia or less likely edema.  Ill-defined nodular opacities are again seen in both midlung fields which may be inflammatory or neoplastic.  No evidence pleural effusion.  Heart size is within normal limits.  IMPRESSION: Coarse pulmonary opacity with bibasilar predominance, and some interval worsening. This may be due to progression of chronic interstitial lung disease, or superimposed acute process such as pneumonia or less likely edema.  Ill-defined nodular opacities in both mid lung fields, which may be inflammatory or neoplastic. Chest CT is recommended for further evaluation.   Electronically Signed   By: Earle Gell M.D.   On: 06/19/2014 08:58   Dg Chest 2 View  06/16/2014   CLINICAL DATA:  Cough. History of prostate cancer. COPD and asthma.  EXAM: CHEST  2 VIEW  COMPARISON:  06/20/2013  FINDINGS: Coarse interstitial accentuation in the lungs with scattered irregular linear opacities, biapical scarring, and finer interstitial opacification at the lung bases. Spiculated nodule, right mid lung scattered scarring. Atherosclerotic aortic arch.  IMPRESSION: 1. Scattered scarring and bilateral interstitial opacities which may reflect atypical pneumonia or an unusual pattern of edema. 2. Spiculated nodule in the right mid lung. Lung cancer not excluded. Consider followup chest CT in the short-term in order to further assess. 3. Biapical scarring. 4. Atherosclerotic aortic arch.   Electronically Signed   By: Sherryl Barters M.D.   On: 06/16/2014 14:29   Ct Chest W Contrast  06/19/2014   CLINICAL DATA:  Abnormal chest radiograph.  EXAM: CT CHEST WITH CONTRAST  TECHNIQUE: Multidetector CT imaging of the chest was performed during intravenous contrast administration.  CONTRAST:  29mL OMNIPAQUE IOHEXOL 300 MG/ML  SOLN  COMPARISON:  Chest radiograph - earlier same  day; 06/20/2013 chest CT- 01/03/2011  FINDINGS: There is cystic bronchiectasis involving the right lower lobe (representative images 42 and 45, series 5). progressed since the 2012 examination with worsening interstitial and consolidative opacities. The central pulmonary airways remain remain patent.  There are consolidative opacities within the dependent portion of the left lower lobe (image 52, series 5) intra favored to represent a combination of  atelectasis and scar. Grossly unchanged slightly asymmetric biapical pleural parenchymal thickening, left greater than right.  Pleural-parenchymal thickening about the superior aspects of the right major fissure. No pleural effusion or pneumothorax.  The sub solid opacity within the left lung apex is grossly unchanged since the 2012 examination, measuring 1.4 x 0.9 cm (image 22, series 5) as is the approximately 0.9 cm nodule within the right lung apex (image 14, series 5) both of which are favored to be the sequela of prior infection.  Shotty suprahilar nodal conglomeration is not enlarged by size criteria measuring approximately 0.9 cm in greatest short axis diameter. No bulky mediastinal, hilar or axillary lymphadenopathy.  Normal heart size. No pericardial effusion. Although this examination was not tailored for the evaluation the pulmonary arteries, there are no discrete filling defects within the central pulmonary arterial tree to suggest central pulmonary embolism. Normal caliber of the main pulmonary artery. Moderate amount of mixed calcified and noncalcified atherosclerotic plaque within a normal caliber thoracic aorta. No definite thoracic aortic dissection or periaortic stranding.  Note is made of an approximately 1.4 cm hypo attenuating nodule within in the left lobe of the thyroid, unchanged since the 2012 examination.  Limited evaluation of the upper abdomen demonstrates an approximately 0.6 cm stone within the neck of an otherwise normal appearing  gallbladder.  Grossly unchanged moderate (approximately 40%) compression deformity involving the superior endplate of the W09 vertebral body, similar to prior chest radiograph performed 06/2013.  IMPRESSION: 1. Progressive cystic bronchiectasis involving the right lower lobe with associated worsening interstitial consolidative airspace opacities - favored to represent the progression of chronic atypical infection (such as MAC), though ongoing aspiration could have a similar appearance. Clinical correlation is advised. 2. Nodules within the bilateral upper lobes are unchanged since the 2012 examination and thus of benign etiology, likely the sequela of prior infection and/or inflammation. 3. Incidentally noted cholelithiasis without evidence of cholecystitis. 4. Unchanged moderate (approximately 40%) compression deformity involving the superior endplate of the W11 vertebral body, similar to prior chest radiograph performed 06/2013.   Electronically Signed   By: Sandi Mariscal M.D.   On: 06/19/2014 22:05   Dg Esophagus  06/20/2014   CLINICAL DATA:  Dysphagia for 1 year.  Concern for aspiration  EXAM: ESOPHOGRAM/BARIUM SWALLOW  TECHNIQUE: Single contrast examination was performed using  thin barium.  FLUOROSCOPY TIME:  1 minutes 6 seconds  COMPARISON:  None.  FINDINGS: Exam was suboptimal due to limited patient mobility.  No swallowing dysfunction in the high cervical esophagus. No aspiration demonstrated. There is no stricture or mass within the esophagus on single contrast exam. No gross esophageal mucosal abnormality.  IMPRESSION: 1. No of abnormality of the esophagus on limited exam. 2. No evidence of aspiration.   Electronically Signed   By: Suzy Bouchard M.D.   On: 06/20/2014 12:36    Microbiology: Recent Results (from the past 240 hour(s))  Culture, blood (routine x 2) Call MD if unable to obtain prior to antibiotics being given     Status: None (Preliminary result)   Collection Time: 06/16/14  5:21 PM   Result Value Ref Range Status   Specimen Description BLOOD LEFT ANTECUBITAL  Final   Special Requests BOTTLES DRAWN AEROBIC AND ANAEROBIC 3 CC EACH  Final   Culture   Final           BLOOD CULTURE RECEIVED NO GROWTH TO DATE CULTURE WILL BE HELD FOR 5 DAYS BEFORE ISSUING A FINAL NEGATIVE REPORT Performed at [facility]    Report Status PENDING  Incomplete  Culture, blood (routine x 2) Call MD if unable to obtain prior to antibiotics being given     Status: None (Preliminary result)   Collection Time: 06/16/14  5:36 PM  Result Value Ref Range Status   Specimen Description BLOOD RIGHT ANTECUBITAL  Final   Special Requests BOTTLES DRAWN AEROBIC AND ANAEROBIC 5 CC EACH  Final   Culture   Final           BLOOD CULTURE RECEIVED NO GROWTH TO DATE CULTURE WILL BE HELD FOR 5 DAYS BEFORE ISSUING A FINAL NEGATIVE REPORT Performed at [facility]    Report Status PENDING  Incomplete     Labs: Basic Metabolic Panel:  Recent Labs Lab 06/16/14 1428 06/16/14 1721 06/17/14 0535 06/19/14 0450 06/20/14 0553  NA 138  --  135 139 137  K 4.3  --  4.4 4.2 4.4  CL 105  --  104 109 104  CO2 26  --  25 23 24   GLUCOSE 118*  --  119* 106* 104*  BUN 30*  --  25* 16 18  CREATININE 1.73* 1.64* 1.51* 1.15 1.25  CALCIUM 9.3  --  8.2* 8.6 9.0   Liver Function Tests:  Recent Labs Lab 06/16/14 1428  AST 22  ALT 11  ALKPHOS 64  BILITOT 1.1  PROT 7.5  ALBUMIN 4.1   No results for input(s): LIPASE, AMYLASE in the last 168 hours. No results for input(s): AMMONIA in the last 168 hours. CBC:  Recent Labs Lab 06/16/14 1428 06/16/14 1721 06/17/14 0535 06/19/14 0450 06/20/14 0553  WBC 10.9* 9.4 8.3 6.4 7.3  NEUTROABS 9.4*  --   --   --   --   HGB 13.7 12.2* 11.7* 11.6* 12.3*  HCT 42.8 39.3 36.5* 35.8* 38.6*  MCV 85.6 86.4 85.1 84.4 83.5  PLT 110* 114* 101* 93* 126*   Cardiac Enzymes: No results for input(s): CKTOTAL, CKMB, CKMBINDEX, TROPONINI in the last 168  hours. BNP: BNP (last 3 results)  Recent Labs  06/16/14 1428  BNP 65.6    ProBNP (last 3 results) No results for input(s): PROBNP in the last 8760 hours.  CBG: No results for input(s): GLUCAP in the last 168 hours.   Discussed with patients daughter/healthcare power of attorney Ms. [name], updated care and answered questions.  Signed:  Vernell Leep, MD, FACP, FHM. [facility] Pager [phone]  If 7PM-7AM, please contact night-coverage [email or web address] Password [facility] 06/20/2014, 12:40 PM

## 2014-06-20 NOTE — Progress Notes (Signed)
Pt is currently DNR. [facility] rep. just met with patient and he stated he would like to have chest compressions now. She did not ask about intubation. MD aware.

## 2014-06-20 NOTE — Progress Notes (Signed)
Clinical Social Work  CSW faxed DC summary to [facility] who has received authorization and agreeable to accept patient today. CSW prepared DC packet with FL2, DC summary, and PTAR forms included. CSW went to bedside but no family present so CSW left a message with wife re: DC plans. CSW spoke with dtr Gabriel Cirri) who reports patient's wife was admitted to the hospital last night. Dtr aware and agreeable to DC. Dtr was surprised that patient was being DC today and feels overwhelmed with mother being admitted as well. Dtr agreeable to PTAR and aware of no guarantee of payment. CSW arranged PTAR for transportation. RN to call report.  PTAR arranged, request #: [id number].  CSW is signing off but available if needed.  [place], [place] [phone]

## 2014-06-23 LAB — CULTURE, BLOOD (ROUTINE X 2)
CULTURE: NO GROWTH
Culture: NO GROWTH

## 2014-06-27 ENCOUNTER — Non-Acute Institutional Stay (SKILLED_NURSING_FACILITY): Payer: PPO | Admitting: Internal Medicine

## 2014-06-27 DIAGNOSIS — R5381 Other malaise: Secondary | ICD-10-CM | POA: Diagnosis not present

## 2014-06-27 DIAGNOSIS — N179 Acute kidney failure, unspecified: Secondary | ICD-10-CM | POA: Diagnosis not present

## 2014-06-27 DIAGNOSIS — F329 Major depressive disorder, single episode, unspecified: Secondary | ICD-10-CM

## 2014-06-27 DIAGNOSIS — J189 Pneumonia, unspecified organism: Secondary | ICD-10-CM | POA: Diagnosis not present

## 2014-06-27 DIAGNOSIS — K219 Gastro-esophageal reflux disease without esophagitis: Secondary | ICD-10-CM

## 2014-06-27 DIAGNOSIS — N3281 Overactive bladder: Secondary | ICD-10-CM | POA: Diagnosis not present

## 2014-06-27 DIAGNOSIS — J449 Chronic obstructive pulmonary disease, unspecified: Secondary | ICD-10-CM

## 2014-06-27 DIAGNOSIS — F32A Depression, unspecified: Secondary | ICD-10-CM

## 2014-06-27 DIAGNOSIS — S22089S Unspecified fracture of T11-T12 vertebra, sequela: Secondary | ICD-10-CM

## 2014-06-27 DIAGNOSIS — N189 Chronic kidney disease, unspecified: Secondary | ICD-10-CM | POA: Diagnosis not present

## 2014-06-27 NOTE — Progress Notes (Signed)
Patient ID: [name], male   DOB: Dec 02, 1922, 81 y.o.   MRN: [id number]    [facility]  Chief Complaint  Patient presents with  . New Admit To SNF   Allergies  Allergen Reactions  . Eggs Or Egg-Derived Products Nausea And Vomiting and Rash  . Latex Rash  . Peanuts [Peanut Oil] Anaphylaxis and Swelling    "swelling of the face"  . Penicillins Rash    Tolerates ceftriaxone  . Erythromycin     "I don't remember"  . Fish Allergy     "I never eat fish except for halibut"  . Other Other (See Comments)    [place] Beans- Throat swelling   . Sulfa Antibiotics Other (See Comments)    Reaction unknown  . Kenalog [Triamcinolone Acetonide] Rash   HPI 81 y/o male pt is here for STR post hospital admission from 06/16/14-06/20/14 with weakness and dyspnea. He was treated for RLL bronchiectasis/ aspiration with superimposed pneumonia. He made clinical improvement. He also had acute renal failure and responded well to iv fluids. He has past medical history of COPD, GERD, prostate cancer, depression, DVT, Alzheimer's, HLD, HTN. He is seen in his room today. He complaints of cough with yellow phlegm and generalized weakness. Denies any other concerns  Review of Systems  Constitutional: Negative for fever, chills, diaphoresis.  HENT: Negative for congestion, hearing loss and sore throat.   Eyes: Negative for blurred vision, double vision and discharge.  Respiratory: Negative for shortness of breath and wheezing.   Cardiovascular: Negative for chest pain, palpitations, leg swelling.  Gastrointestinal: Negative for heartburn, nausea, vomiting, abdominal pain  Genitourinary: Negative for dysuria Musculoskeletal: Negative for falls in facility  Skin: Negative for itching and rash.  Neurological: Negative for dizziness, tingling and headaches.  Psychiatric/Behavioral: Negative for depression.    Past Medical History  Diagnosis Date  . COPD (chronic obstructive  pulmonary disease)   . GERD (gastroesophageal reflux disease)   . Prostate cancer     "had prostate removed and had chemo" (02/09/2012)  . Depression   . DVT (deep venous thrombosis)   . Alzheimer's disease   . High cholesterol   . Asthma   . Hypertension   . Pneumonia     "4-5 times thru my life" (02/09/2012)  . Chronic bronchitis     "get it q year" (02/09/2012)  . Exertional dyspnea     "sometimes" (02/09/2012)  . History of blood transfusion     "when prostate removed" (02/09/2012)  . Arthritis     "in my fingers"  (02/09/2012)   Past Surgical History  Procedure Laterality Date  . Tonsillectomy  ~ 1938  . Appendectomy  1950's?  . Cataract extraction w/ intraocular lens  implant, bilateral    . Prostatectomy    . Tear duct probing      "have had it done twice; left eye only?" (02/09/2012)  . Eye surgery     Medication reviewed. See MAR  No family history on file.   History   Social History  . Marital Status: Married    Spouse Name: N/A  . Number of Children: N/A  . Years of Education: N/A   Occupational History  . Not on file.   Social History Main Topics  . Smoking status: Former Smoker -- 1.00 packs/day for 44 years    Types: Cigarettes  . Smokeless tobacco: Never Used     Comment: 02/09/2012 " stopped smoking cigarrette > 30 years ago"  .  Alcohol Use: 1.2 oz/week    1 Cans of beer, 1 Shots of liquor per week     Comment: 02/09/2012 "beer w/dinner  or screwdriver 1-2 X/wk"  . Drug Use: No  . Sexual Activity: Not Currently   Other Topics Concern  . Not on file   Social History Narrative   Physical exam BP 109/53 mmHg  Pulse 70  Temp(Src) 98 F (36.7 C)  Resp 18  SpO2 97%  General- elderly male in no acute distress Head- atraumatic, normocephalic Neck- no lymphadenopathy Cardiovascular- normal s1,s2, no murmurs, no leg edema Respiratory- bilateral decreased air entry, wheeze +, no rhonchi, no crackles Abdomen- bowel sounds present, soft, non  tender Musculoskeletal- able to move all 4 extremities Neurological- no focal deficit Psychiatry- alert and oriented to person, place and time, normal mood and affect  Labs reviewed  Assessment/plan  Physical deconditioning Will have him work with physical therapy and occupational therapy team to help with gait training and muscle  strengthening exercises.fall precautions. Skin care. Encourage to be out of bed. Continue ensure  supplements  CAP Completed the course of keflex and flagyl. Continue mechanical soft diet with thin liquids with concerns of chronic aspiration. Aspiration precautions. To f/u with SLP team. Has f/u with pulmonary in 2 weeks    Acute on chronic renal failure Responded well to iv fluids, monitor clinically  COPD Stable. Continue the bronchodilators  Depression Stable. Continue Zoloft.  t12 vertebral body fracture Continue tylenol 650 mg q6h prn pain  gerd Continue prilosec 20 mg daily  OAB Continue ditropan 5 mg bid  Labs ordered: cbc with diff, cmp  Goals of care: short term rehabilitation   Family/ staff Communication: reviewed care plan with patient and nursing supervisor   Blanchie Serve, MD  [facility] [phone] (Monday-Friday 8 am - 5 pm) [phone] (afterhours)

## 2014-07-04 ENCOUNTER — Inpatient Hospital Stay: Payer: PPO | Admitting: Adult Health

## 2014-07-09 ENCOUNTER — Non-Acute Institutional Stay (SKILLED_NURSING_FACILITY): Payer: PPO | Admitting: Adult Health

## 2014-07-09 ENCOUNTER — Encounter: Payer: Self-pay | Admitting: Adult Health

## 2014-07-09 DIAGNOSIS — F32A Depression, unspecified: Secondary | ICD-10-CM

## 2014-07-09 DIAGNOSIS — R5381 Other malaise: Secondary | ICD-10-CM

## 2014-07-09 DIAGNOSIS — G309 Alzheimer's disease, unspecified: Secondary | ICD-10-CM | POA: Diagnosis not present

## 2014-07-09 DIAGNOSIS — F329 Major depressive disorder, single episode, unspecified: Secondary | ICD-10-CM | POA: Diagnosis not present

## 2014-07-09 DIAGNOSIS — J449 Chronic obstructive pulmonary disease, unspecified: Secondary | ICD-10-CM | POA: Diagnosis not present

## 2014-07-09 DIAGNOSIS — N3281 Overactive bladder: Secondary | ICD-10-CM | POA: Diagnosis not present

## 2014-07-09 DIAGNOSIS — K219 Gastro-esophageal reflux disease without esophagitis: Secondary | ICD-10-CM | POA: Diagnosis not present

## 2014-07-09 DIAGNOSIS — F0280 Dementia in other diseases classified elsewhere without behavioral disturbance: Secondary | ICD-10-CM

## 2014-07-09 NOTE — Progress Notes (Addendum)
Patient ID: [name], male   DOB: [birth year], 81 y.o.   MRN: [id number]   07/09/2014  Facility:  Nursing Home Location:  [place] Room Number: 573-2 LEVEL OF CARE:  SNF (31)   Chief Complaint  Patient presents with  . Discharge Note    Physical deconditioning, COPD, Depression, GERD and Overactive bladder    HISTORY OF PRESENT ILLNESS:  This is an 81 year old male who has been admitted to [facility] on 06/20/14 from [facility]. He has PMH of COPD, GERD, Prostate Cancer, Depression, DVT, Alzheimer's disease, Hyperlipidemia and Hypertension. He was admitted to the hospital with Failed Outpatient Levofloxacin treatment. Chest x-ray showed atypical PNA. He was empirically treated with IV Vancomycin and Cefepime. He has been discharged on PO Keflex and Flagyl and has completed course. No coughing,SOB, fever nor phlegm.   Patient was admitted to this facility for short-term rehabilitation after the patient's recent hospitalization.  Patient has completed SNF rehabilitation and therapy has cleared the patient for discharge. Patient is now well enough to [facility].   PAST MEDICAL HISTORY:  Past Medical History  Diagnosis Date  . COPD (chronic obstructive pulmonary disease)   . GERD (gastroesophageal reflux disease)   . Prostate cancer     "had prostate removed and had chemo" (02/09/2012)  . Depression   . DVT (deep venous thrombosis)   . Alzheimer's disease   . High cholesterol   . Asthma   . Hypertension   . Pneumonia     "4-5 times thru my life" (02/09/2012)  . Chronic bronchitis     "get it q year" (02/09/2012)  . Exertional dyspnea     "sometimes" (02/09/2012)  . History of blood transfusion     "when prostate removed" (02/09/2012)  . Arthritis     "in my fingers"  (02/09/2012)    CURRENT MEDICATIONS: Reviewed per MAR/see medication list  Allergies  Allergen Reactions  . Eggs Or Egg-Derived Products  Nausea And Vomiting and Rash  . Latex Rash  . Peanuts [Peanut Oil] Anaphylaxis and Swelling    "swelling of the face"  . Penicillins Rash    Tolerates ceftriaxone  . Erythromycin     "I don't remember"  . Fish Allergy     "I never eat fish except for halibut"  . Sulfa Antibiotics Other (See Comments)    Reaction unknown  . Kenalog [Triamcinolone Acetonide] Rash     REVIEW OF SYSTEMS:  GENERAL: no change in appetite, no fatigue, no weight changes, no fever, chills or weakness RESPIRATORY: no cough, SOB, DOE, wheezing, hemoptysis CARDIAC: no chest pain, edema or palpitations GI: no abdominal pain, diarrhea, constipation, heart burn, nausea or vomiting  PHYSICAL EXAMINATION  GENERAL: no acute distress, normal body habitus NECK: supple, trachea midline, no neck masses, no thyroid tenderness, no thyromegaly LYMPHATICS: no LAN in the neck, no supraclavicular LAN RESPIRATORY: breathing is even & unlabored, BS CTAB CARDIAC: RRR, no murmur,no extra heart sounds, no edema GI: abdomen soft, normal BS, no masses, no tenderness, no hepatomegaly, no splenomegaly EXTREMITIES:  Able to move X 4 extremities PSYCHIATRIC: the patient is alert & oriented to person, affect & behavior appropriate  LABS/RADIOLOGY: Labs reviewed: Basic Metabolic Panel:  Recent Labs  06/17/14 0535 06/19/14 0450 06/20/14 0553  NA 135 139 137  K 4.4 4.2 4.4  CL 104 109 104  CO2 25 23 24   GLUCOSE 119* 106* 104*  BUN 25* 16 18  CREATININE 1.51* 1.15 1.25  CALCIUM 8.2* 8.6 9.0   Liver Function Tests:  Recent Labs  06/16/14 1428  AST 22  ALT 11  ALKPHOS 64  BILITOT 1.1  PROT 7.5  ALBUMIN 4.1   CBC:  Recent Labs  06/16/14 1428  06/17/14 0535 06/19/14 0450 06/20/14 0553  WBC 10.9*  < > 8.3 6.4 7.3  NEUTROABS 9.4*  --   --   --   --   HGB 13.7  < > 11.7* 11.6* 12.3*  HCT 42.8  < > 36.5* 35.8* 38.6*  MCV 85.6  < > 85.1 84.4 83.5  PLT 110*  < > 101* 93* 126*  < > = values in this interval  not displayed.   Dg Chest 2 View  06/19/2014   CLINICAL DATA:  Pneumonia. Chronic interstitial lung disease. Prostate carcinoma.  EXAM: CHEST  2 VIEW  COMPARISON:  06/16/2014 and 06/20/2013  FINDINGS: Coarse pulmonary opacity is seen in the peripheral lung fields predominantly in the lung bases which shows some interval worsening. This may be due to progression of chronic interstitial lung disease, or superimposed pneumonia or less likely edema.  Ill-defined nodular opacities are again seen in both midlung fields which may be inflammatory or neoplastic.  No evidence pleural effusion.  Heart size is within normal limits.  IMPRESSION: Coarse pulmonary opacity with bibasilar predominance, and some interval worsening. This may be due to progression of chronic interstitial lung disease, or superimposed acute process such as pneumonia or less likely edema.  Ill-defined nodular opacities in both mid lung fields, which may be inflammatory or neoplastic. Chest CT is recommended for further evaluation.   Electronically Signed   By: Earle Gell M.D.   On: 06/19/2014 08:58   Dg Chest 2 View  06/16/2014   CLINICAL DATA:  Cough. History of prostate cancer. COPD and asthma.  EXAM: CHEST  2 VIEW  COMPARISON:  06/20/2013  FINDINGS: Coarse interstitial accentuation in the lungs with scattered irregular linear opacities, biapical scarring, and finer interstitial opacification at the lung bases. Spiculated nodule, right mid lung scattered scarring. Atherosclerotic aortic arch.  IMPRESSION: 1. Scattered scarring and bilateral interstitial opacities which may reflect atypical pneumonia or an unusual pattern of edema. 2. Spiculated nodule in the right mid lung. Lung cancer not excluded. Consider followup chest CT in the short-term in order to further assess. 3. Biapical scarring. 4. Atherosclerotic aortic arch.   Electronically Signed   By: Sherryl Barters M.D.   On: 06/16/2014 14:29   Ct Chest W Contrast  06/19/2014   CLINICAL  DATA:  Abnormal chest radiograph.  EXAM: CT CHEST WITH CONTRAST  TECHNIQUE: Multidetector CT imaging of the chest was performed during intravenous contrast administration.  CONTRAST:  64mL OMNIPAQUE IOHEXOL 300 MG/ML  SOLN  COMPARISON:  Chest radiograph - earlier same day; 06/20/2013 chest CT- 01/03/2011  FINDINGS: There is cystic bronchiectasis involving the right lower lobe (representative images 42 and 45, series 5). progressed since the 2012 examination with worsening interstitial and consolidative opacities. The central pulmonary airways remain remain patent.  There are consolidative opacities within the dependent portion of the left lower lobe (image 52, series 5) intra favored to represent a combination of atelectasis and scar. Grossly unchanged slightly asymmetric biapical pleural parenchymal thickening, left greater than right.  Pleural-parenchymal thickening about the superior aspects of the right major fissure. No pleural effusion or pneumothorax.  The sub solid opacity within the left lung apex is grossly unchanged since the 2012 examination, measuring  1.4 x 0.9 cm (image 22, series 5) as is the approximately 0.9 cm nodule within the right lung apex (image 14, series 5) both of which are favored to be the sequela of prior infection.  Shotty suprahilar nodal conglomeration is not enlarged by size criteria measuring approximately 0.9 cm in greatest short axis diameter. No bulky mediastinal, hilar or axillary lymphadenopathy.  Normal heart size. No pericardial effusion. Although this examination was not tailored for the evaluation the pulmonary arteries, there are no discrete filling defects within the central pulmonary arterial tree to suggest central pulmonary embolism. Normal caliber of the main pulmonary artery. Moderate amount of mixed calcified and noncalcified atherosclerotic plaque within a normal caliber thoracic aorta. No definite thoracic aortic dissection or periaortic stranding.  Note is made  of an approximately 1.4 cm hypo attenuating nodule within in the left lobe of the thyroid, unchanged since the 2012 examination.  Limited evaluation of the upper abdomen demonstrates an approximately 0.6 cm stone within the neck of an otherwise normal appearing gallbladder.  Grossly unchanged moderate (approximately 40%) compression deformity involving the superior endplate of the O12 vertebral body, similar to prior chest radiograph performed 06/2013.  IMPRESSION: 1. Progressive cystic bronchiectasis involving the right lower lobe with associated worsening interstitial consolidative airspace opacities - favored to represent the progression of chronic atypical infection (such as MAC), though ongoing aspiration could have a similar appearance. Clinical correlation is advised. 2. Nodules within the bilateral upper lobes are unchanged since the 2012 examination and thus of benign etiology, likely the sequela of prior infection and/or inflammation. 3. Incidentally noted cholelithiasis without evidence of cholecystitis. 4. Unchanged moderate (approximately 40%) compression deformity involving the superior endplate of the Y48 vertebral body, similar to prior chest radiograph performed 06/2013.   Electronically Signed   By: Sandi Mariscal M.D.   On: 06/19/2014 22:05   Dg Esophagus  06/20/2014   CLINICAL DATA:  Dysphagia for 1 year.  Concern for aspiration  EXAM: ESOPHOGRAM/BARIUM SWALLOW  TECHNIQUE: Single contrast examination was performed using  thin barium.  FLUOROSCOPY TIME:  1 minutes 6 seconds  COMPARISON:  None.  FINDINGS: Exam was suboptimal due to limited patient mobility.  No swallowing dysfunction in the high cervical esophagus. No aspiration demonstrated. There is no stricture or mass within the esophagus on single contrast exam. No gross esophageal mucosal abnormality.  IMPRESSION: 1. No of abnormality of the esophagus on limited exam. 2. No evidence of aspiration.   Electronically Signed   By: Suzy Bouchard M.D.   On: 06/20/2014 12:36    ASSESSMENT/PLAN:  Physical deconditioning - for Home health PT, OT, ST, Nursing and CNA Pneumonia - Resolved COPD - stable; continue albuterol and Proventil when necessary Depression - mood is stable; continue Zoloft 150 mg PO Q D Alzheimer's Disease - continue Razadyne 12 mg 1 tab PO BID GERD - continue Prilosec 20 mg PO Q D Overactive Bladder - continue Ditropan 5 mg PO BID    I have filled out patient's discharge paperwork and written prescriptions.  Patient will receive home health PT, OT, ST, Nursing and CNA.  Total discharge time: Greater than 30 minutes  Discharge time involved coordination of the discharge process with social worker, nursing staff and therapy department. Medical justification for home health services verified.    [facility], NP [employer] [phone]

## 2014-07-13 DIAGNOSIS — N179 Acute kidney failure, unspecified: Secondary | ICD-10-CM | POA: Diagnosis not present

## 2014-07-13 DIAGNOSIS — I129 Hypertensive chronic kidney disease with stage 1 through stage 4 chronic kidney disease, or unspecified chronic kidney disease: Secondary | ICD-10-CM | POA: Diagnosis not present

## 2014-07-13 DIAGNOSIS — J449 Chronic obstructive pulmonary disease, unspecified: Secondary | ICD-10-CM | POA: Diagnosis not present

## 2014-07-13 DIAGNOSIS — Z8546 Personal history of malignant neoplasm of prostate: Secondary | ICD-10-CM | POA: Diagnosis not present

## 2014-07-13 DIAGNOSIS — F0390 Unspecified dementia without behavioral disturbance: Secondary | ICD-10-CM | POA: Diagnosis not present

## 2014-07-30 ENCOUNTER — Emergency Department (HOSPITAL_COMMUNITY)
Admission: EM | Admit: 2014-07-30 | Discharge: 2014-07-30 | Disposition: A | Discharge status: Home / Self Care | Payer: PPO | Attending: Emergency Medicine | Admitting: Emergency Medicine

## 2014-07-30 ENCOUNTER — Emergency Department (HOSPITAL_COMMUNITY): Payer: PPO

## 2014-07-30 ENCOUNTER — Encounter (HOSPITAL_COMMUNITY): Payer: Self-pay

## 2014-07-30 DIAGNOSIS — Y998 Other external cause status: Secondary | ICD-10-CM | POA: Insufficient documentation

## 2014-07-30 DIAGNOSIS — Z7951 Long term (current) use of inhaled steroids: Secondary | ICD-10-CM | POA: Insufficient documentation

## 2014-07-30 DIAGNOSIS — G309 Alzheimer's disease, unspecified: Secondary | ICD-10-CM | POA: Insufficient documentation

## 2014-07-30 DIAGNOSIS — R05 Cough: Secondary | ICD-10-CM | POA: Insufficient documentation

## 2014-07-30 DIAGNOSIS — J449 Chronic obstructive pulmonary disease, unspecified: Secondary | ICD-10-CM | POA: Insufficient documentation

## 2014-07-30 DIAGNOSIS — W01198A Fall on same level from slipping, tripping and stumbling with subsequent striking against other object, initial encounter: Secondary | ICD-10-CM | POA: Insufficient documentation

## 2014-07-30 DIAGNOSIS — S3992XA Unspecified injury of lower back, initial encounter: Secondary | ICD-10-CM | POA: Diagnosis present

## 2014-07-30 DIAGNOSIS — E780 Pure hypercholesterolemia: Secondary | ICD-10-CM | POA: Diagnosis not present

## 2014-07-30 DIAGNOSIS — Z79899 Other long term (current) drug therapy: Secondary | ICD-10-CM | POA: Insufficient documentation

## 2014-07-30 DIAGNOSIS — W19XXXA Unspecified fall, initial encounter: Secondary | ICD-10-CM

## 2014-07-30 DIAGNOSIS — Z8546 Personal history of malignant neoplasm of prostate: Secondary | ICD-10-CM | POA: Insufficient documentation

## 2014-07-30 DIAGNOSIS — F329 Major depressive disorder, single episode, unspecified: Secondary | ICD-10-CM | POA: Diagnosis not present

## 2014-07-30 DIAGNOSIS — Z880 Allergy status to penicillin: Secondary | ICD-10-CM | POA: Diagnosis not present

## 2014-07-30 DIAGNOSIS — Y9301 Activity, walking, marching and hiking: Secondary | ICD-10-CM | POA: Diagnosis not present

## 2014-07-30 DIAGNOSIS — Z8701 Personal history of pneumonia (recurrent): Secondary | ICD-10-CM | POA: Diagnosis not present

## 2014-07-30 DIAGNOSIS — R059 Cough, unspecified: Secondary | ICD-10-CM

## 2014-07-30 DIAGNOSIS — Z87891 Personal history of nicotine dependence: Secondary | ICD-10-CM | POA: Insufficient documentation

## 2014-07-30 DIAGNOSIS — M1990 Unspecified osteoarthritis, unspecified site: Secondary | ICD-10-CM | POA: Insufficient documentation

## 2014-07-30 DIAGNOSIS — S32020A Wedge compression fracture of second lumbar vertebra, initial encounter for closed fracture: Secondary | ICD-10-CM | POA: Insufficient documentation

## 2014-07-30 DIAGNOSIS — Y92091 Bathroom in other non-institutional residence as the place of occurrence of the external cause: Secondary | ICD-10-CM | POA: Insufficient documentation

## 2014-07-30 DIAGNOSIS — IMO0002 Reserved for concepts with insufficient information to code with codable children: Secondary | ICD-10-CM

## 2014-07-30 DIAGNOSIS — Z91040 Latex allergy status: Secondary | ICD-10-CM | POA: Diagnosis not present

## 2014-07-30 DIAGNOSIS — K219 Gastro-esophageal reflux disease without esophagitis: Secondary | ICD-10-CM | POA: Diagnosis not present

## 2014-07-30 DIAGNOSIS — S22080A Wedge compression fracture of T11-T12 vertebra, initial encounter for closed fracture: Secondary | ICD-10-CM | POA: Insufficient documentation

## 2014-07-30 DIAGNOSIS — Z86718 Personal history of other venous thrombosis and embolism: Secondary | ICD-10-CM | POA: Diagnosis not present

## 2014-07-30 LAB — COMPREHENSIVE METABOLIC PANEL
ALT: 15 U/L (ref 0–53)
ANION GAP: 9 (ref 5–15)
AST: 20 U/L (ref 0–37)
Albumin: 4.3 g/dL (ref 3.5–5.2)
Alkaline Phosphatase: 52 U/L (ref 39–117)
BUN: 32 mg/dL — AB (ref 6–23)
CALCIUM: 9.1 mg/dL (ref 8.4–10.5)
CO2: 25 mmol/L (ref 19–32)
Chloride: 105 mmol/L (ref 96–112)
Creatinine, Ser: 1.47 mg/dL — ABNORMAL HIGH (ref 0.50–1.35)
GFR, EST AFRICAN AMERICAN: 47 mL/min — AB (ref >90)
GFR, EST NON AFRICAN AMERICAN: 40 mL/min — AB (ref >90)
GLUCOSE: 115 mg/dL — AB (ref 70–99)
Potassium: 4.3 mmol/L (ref 3.5–5.1)
Sodium: 139 mmol/L (ref 135–145)
Total Bilirubin: 1.2 mg/dL (ref 0.3–1.2)
Total Protein: 7.4 g/dL (ref 6.0–8.3)

## 2014-07-30 LAB — CBC WITH DIFFERENTIAL/PLATELET
BASOS ABS: 0 10*3/uL (ref 0.0–0.1)
BASOS PCT: 0 % (ref 0–1)
EOS ABS: 0.1 10*3/uL (ref 0.0–0.7)
Eosinophils Relative: 1 % (ref 0–5)
HEMATOCRIT: 42.7 % (ref 39.0–52.0)
Hemoglobin: 14.2 g/dL (ref 13.0–17.0)
Lymphocytes Relative: 5 % — ABNORMAL LOW (ref 12–46)
Lymphs Abs: 0.5 10*3/uL — ABNORMAL LOW (ref 0.7–4.0)
MCH: 28.6 pg (ref 26.0–34.0)
MCHC: 33.3 g/dL (ref 30.0–36.0)
MCV: 85.9 fL (ref 78.0–100.0)
MONOS PCT: 9 % (ref 3–12)
Monocytes Absolute: 0.9 10*3/uL (ref 0.1–1.0)
Neutro Abs: 8.4 10*3/uL — ABNORMAL HIGH (ref 1.7–7.7)
Neutrophils Relative %: 84 % — ABNORMAL HIGH (ref 43–77)
Platelets: 110 10*3/uL — ABNORMAL LOW (ref 150–400)
RBC: 4.97 MIL/uL (ref 4.22–5.81)
RDW: 16.1 % — AB (ref 11.5–15.5)
WBC: 10 10*3/uL (ref 4.0–10.5)

## 2014-07-30 LAB — URINALYSIS, ROUTINE W REFLEX MICROSCOPIC
Bilirubin Urine: NEGATIVE
GLUCOSE, UA: NEGATIVE mg/dL
HGB URINE DIPSTICK: NEGATIVE
Ketones, ur: 15 mg/dL — AB
Leukocytes, UA: NEGATIVE
Nitrite: NEGATIVE
PROTEIN: NEGATIVE mg/dL
Specific Gravity, Urine: 1.023 (ref 1.005–1.030)
Urobilinogen, UA: 0.2 mg/dL (ref 0.0–1.0)
pH: 5 (ref 5.0–8.0)

## 2014-07-30 MED ORDER — SODIUM CHLORIDE 0.9 % IV BOLUS (SEPSIS)
500.0000 mL | Freq: Once | INTRAVENOUS | Status: AC
  Administered 2014-07-30: 500 mL via INTRAVENOUS

## 2014-07-30 MED ORDER — ACETAMINOPHEN 325 MG PO TABS
650.0000 mg | ORAL_TABLET | Freq: Once | ORAL | Status: AC
  Administered 2014-07-30: 650 mg via ORAL
  Filled 2014-07-30: qty 2

## 2014-07-30 NOTE — ED Notes (Signed)
MD at bedside. EDP HARRISON

## 2014-07-30 NOTE — ED Notes (Signed)
MD at bedside. 

## 2014-07-30 NOTE — ED Notes (Signed)
Per [facility]- Pt resides at home with wife. Pt fell Sunday NO LOC. Resulting in pain to buttocks. Did not get seen for this fall. Pt has fallen again today. "tripped while going to bathroom" NO LOC today. Pt  Landed on buttocks again this am. Increased pain to this area. Here for evaluation for falls and pain. Denies any other complaints. Wife concerns for pt's loss of appetite.

## 2014-07-30 NOTE — ED Notes (Signed)
WIFE STATES RECENT DX OF "ASPIRATION INTO THE LUNGS." "MUST HAVE PILLS CRUSHED IN APPLE SAUCE". ASKED IF HE CAN DRINK WATER WIFE REPLIED YES. EDP HARRISON MADE AWARE

## 2014-07-30 NOTE — ED Notes (Signed)
AMANDA R NT AND ROBERT NT PERFORMING IN AND OUT

## 2014-07-30 NOTE — ED Notes (Signed)
Bed: WA24 Expected date:  Expected time:  Means of arrival:  Comments: EMS-fall 

## 2014-07-30 NOTE — ED Provider Notes (Signed)
CSN: [id number]     Arrival date & time 07/30/14  1205 History   First MD Initiated Contact with Patient 07/30/14 1209     Chief Complaint  Patient presents with  . Fall    FREQUENT FALLS  . Pain    BUTTOCK     (Consider location/radiation/quality/duration/timing/severity/associated sxs/prior Treatment) Patient is a 81 y.o. male presenting with fall. The history is provided by the spouse and the patient.  Fall This is a new problem. The current episode started 2 days ago. Episode frequency: twice. The problem has not changed since onset.Pertinent negatives include no chest pain, no abdominal pain, no headaches and no shortness of breath. Nothing aggravates the symptoms. Nothing relieves the symptoms. He has tried nothing for the symptoms. The treatment provided no relief.    Past Medical History  Diagnosis Date  . COPD (chronic obstructive pulmonary disease)   . GERD (gastroesophageal reflux disease)   . Prostate cancer     "had prostate removed and had chemo" (02/09/2012)  . Depression   . DVT (deep venous thrombosis)   . Alzheimer's disease   . High cholesterol   . Asthma   . Hypertension   . Pneumonia     "4-5 times thru my life" (02/09/2012)  . Chronic bronchitis     "get it q year" (02/09/2012)  . Exertional dyspnea     "sometimes" (02/09/2012)  . History of blood transfusion     "when prostate removed" (02/09/2012)  . Arthritis     "in my fingers"  (02/09/2012)   Past Surgical History  Procedure Laterality Date  . Tonsillectomy  ~ 1938  . Appendectomy  1950's?  . Cataract extraction w/ intraocular lens  implant, bilateral    . Prostatectomy    . Tear duct probing      "have had it done twice; left eye only?" (02/09/2012)  . Eye surgery     No family history on file. History  Substance Use Topics  . Smoking status: Former Smoker -- 1.00 packs/day for 44 years    Types: Cigarettes  . Smokeless tobacco: Never Used     Comment: 02/09/2012 " stopped smoking  cigarrette > 30 years ago"  . Alcohol Use: 1.2 oz/week    1 Cans of beer, 1 Shots of liquor per week     Comment: 02/09/2012 "beer w/dinner  or screwdriver 1-2 X/wk"    Review of Systems  Constitutional: Negative for fever.  HENT: Negative for drooling and rhinorrhea.   Eyes: Negative for pain.  Respiratory: Negative for cough and shortness of breath.   Cardiovascular: Negative for chest pain and leg swelling.  Gastrointestinal: Negative for nausea, vomiting, abdominal pain and diarrhea.  Genitourinary: Negative for dysuria and hematuria.  Musculoskeletal: Negative for gait problem and neck pain.  Skin: Negative for color change.  Neurological: Negative for numbness and headaches.       Falls  Hematological: Negative for adenopathy.  Psychiatric/Behavioral: Negative for behavioral problems.  All other systems reviewed and are negative.     Allergies  Eggs or egg-derived products; Latex; Peanuts; Penicillins; Erythromycin; Fish allergy; Other; Sulfa antibiotics; and Kenalog  Home Medications   Prior to Admission medications   Medication Sig Start Date End Date Taking? Authorizing Provider  Acetaminophen (APAP) 325 MG tablet Take 650 mg by mouth every 6 (six) hours as needed for pain.   Yes Historical Provider, MD  budesonide-formoterol (SYMBICORT) 80-4.5 MCG/ACT inhaler Inhale 2 puffs into the lungs 2 (two) times daily. For  COPD   Yes Historical Provider, MD  cholecalciferol (VITAMIN D) 1000 UNITS tablet Take 1,000 Units by mouth daily. For supplement   Yes Historical Provider, MD  feeding supplement (ENSURE COMPLETE) LIQD Take 237 mLs by mouth 3 (three) times daily between meals. 02/13/12  Yes Marianne L York, PA-C  galantamine (RAZADYNE) 12 MG tablet Take 12 mg by mouth 2 (two) times daily. For dementia   Yes Historical Provider, MD  Multiple Vitamins-Minerals (CENTRUM SILVER PO) Take 1 tablet by mouth daily.   Yes Historical Provider, MD  multivitamin-lutein (OCUVITE-LUTEIN)  CAPS Take 1 capsule by mouth daily.   Yes Historical Provider, MD  ondansetron (ZOFRAN) 4 MG tablet Take 4 mg by mouth 3 (three) times daily as needed for nausea or vomiting.    Yes Historical Provider, MD  oxybutynin (DITROPAN) 5 MG tablet Take 5 mg by mouth 2 (two) times daily. For overactive bladder   Yes Historical Provider, MD  pantoprazole (PROTONIX) 40 MG tablet Take 40 mg by mouth daily.   Yes Historical Provider, MD  polyvinyl alcohol (LIQUIFILM TEARS) 1.4 % ophthalmic solution Place 1 drop into both eyes as needed for dry eyes.   Yes Historical Provider, MD  PROVENTIL HFA 108 (90 BASE) MCG/ACT inhaler INHALE 2 PUFFS BY MOUTH EVERY 6 HOURS AS NEEDED 11/04/13  Yes Tiffany L Reed, DO  sertraline (ZOLOFT) 100 MG tablet Take 150 mg by mouth daily. For depression.   Yes Historical Provider, MD  albuterol (PROVENTIL) (5 MG/ML) 0.5% nebulizer solution Take 0.5 mLs (2.5 mg total) by nebulization every 2 (two) hours as needed for wheezing. 02/13/12   Bobby Rumpf York, PA-C  cephALEXin (KEFLEX) 500 MG capsule Take 1 capsule (500 mg total) by mouth every 12 (twelve) hours. Take through 06/25/14 doses then discontinue. 06/20/14   Modena Jansky, MD  metroNIDAZOLE (FLAGYL) 500 MG tablet Take 1 tablet (500 mg total) by mouth every 8 (eight) hours. Take through 06/25/14 doses then discontinue. 06/20/14   Modena Jansky, MD   BP 166/63 mmHg  Pulse 72  Temp(Src) 97.6 F (36.4 C) (Oral)  Resp 20  SpO2 92% Physical Exam  Constitutional: He is oriented to person, place, and time. He appears well-developed and well-nourished.  HENT:  Head: Normocephalic and atraumatic.  Right Ear: External ear normal.  Left Ear: External ear normal.  Nose: Nose normal.  Mouth/Throat: Oropharynx is clear and moist. No oropharyngeal exudate.  Eyes: Conjunctivae and EOM are normal. Pupils are equal, round, and reactive to light.  Neck: Normal range of motion. Neck supple.  Cardiovascular: Normal rate, regular rhythm, normal  heart sounds and intact distal pulses.  Exam reveals no gallop and no friction rub.   No murmur heard. Pulmonary/Chest: Effort normal and breath sounds normal. No respiratory distress. He has no wheezes.  Abdominal: Soft. Bowel sounds are normal. He exhibits no distension. There is no tenderness. There is no rebound and no guarding.  Musculoskeletal: Normal range of motion. He exhibits tenderness. He exhibits no edema.  Mild ttp of lower lumbar spine. No other vertebral tenderness noted.  Normal range of motion of the hips bilaterally without pain.  Neurological: He is alert and oriented to person, place, and time.  alert, oriented x3 speech: normal in context and clarity memory: intact grossly cranial nerves II-XII: intact motor strength: full proximally and distally sensation: intact to light touch diffusely  cerebellar: finger-to-nose intact gait: Ambulates with a walker at his baseline.  Skin: Skin is warm and dry.  Psychiatric:  He has a normal mood and affect. His behavior is normal.  Nursing note and vitals reviewed.   ED Course  Procedures (including critical care time) Labs Review Labs Reviewed  CBC WITH DIFFERENTIAL/PLATELET - Abnormal; Notable for the following:    RDW 16.1 (*)    Platelets 110 (*)    Neutrophils Relative % 84 (*)    Neutro Abs 8.4 (*)    Lymphocytes Relative 5 (*)    Lymphs Abs 0.5 (*)    All other components within normal limits  COMPREHENSIVE METABOLIC PANEL - Abnormal; Notable for the following:    Glucose, Bld 115 (*)    BUN 32 (*)    Creatinine, Ser 1.47 (*)    GFR calc non Af Amer 40 (*)    GFR calc Af Amer 47 (*)    All other components within normal limits  URINALYSIS, ROUTINE W REFLEX MICROSCOPIC - Abnormal; Notable for the following:    Ketones, ur 15 (*)    All other components within normal limits    Imaging Review Dg Chest 2 View  07/30/2014   CLINICAL DATA:  Asthma, cough, falls, history COPD, prostate cancer, Alzheimer's   EXAM: CHEST  2 VIEW  COMPARISON:  06/19/2014  FINDINGS: Normal heart size, mediastinal contours and pulmonary vascularity.  Emphysematous changes consistent with COPD.  Bibasilar atelectasis.  No acute infiltrate, pleural effusion, or pneumothorax.  Bones demineralized.  Mild superior endplate compression deformities at adjacent vertebra at the upper lumbar spine.  IMPRESSION: COPD changes with bibasilar atelectasis.  No acute abnormalities.   Electronically Signed   By: Lavonia Dana M.D.   On: 07/30/2014 14:21   Dg Lumbar Spine Complete  07/30/2014   CLINICAL DATA:  Two falls since Sunday, most recent fall today, low back pain  EXAM: LUMBAR SPINE - COMPLETE 4+ VIEW  COMPARISON:  01/03/2011  FINDINGS: Diffuse osseous demineralization.  Five non-rib-bearing lumbar vertebrae.  Superior endplate compression fractures of T12 and L1, greater at L1.  When compared to the 2012 exam, L1 fracture is minimally increased and T12 fracture is new.  Disc space narrowing with endplate spur formation L5-S1.  Facet degenerative changes lumbar spine.  Subtle superior endplate compression deformity of L2, new.  No subluxation or bone destruction.  SI joints symmetric.  IMPRESSION: New superior endplate compression fractures of T12 and L2 with slightly increased superior endplate compression fracture of L1 since 2012.  Osseous demineralization.   Electronically Signed   By: Lavonia Dana M.D.   On: 07/30/2014 14:31   Dg Sacrum/coccyx  07/30/2014   CLINICAL DATA:  Two falls since Sunday, most recent of which was today, sacrococcygeal pain  EXAM: SACRUM AND COCCYX - 2+ VIEW  COMPARISON:  None  FINDINGS: Osseous demineralization.  Symmetric SI joints and sacral foramina.  Minimal narrowing of the hip joints bilaterally.  No sacrococcygeal fracture or bone destruction identified.  Scattered vascular calcifications.  IMPRESSION: Osseous demineralization.  No acute sacrococcygeal abnormalities.   Electronically Signed   By: Lavonia Dana  M.D.   On: 07/30/2014 14:23   Ct Head Wo Contrast  07/30/2014   CLINICAL DATA:  Several recent falls  EXAM: CT HEAD WITHOUT CONTRAST  TECHNIQUE: Contiguous axial images were obtained from the base of the skull through the vertex without intravenous contrast.  COMPARISON:  CT brain of 01/03/2011  FINDINGS: There is little change in ventricular dilatation as well as diffuse cortical prominence consistent with atrophy. The septum is midline in position. The fourth ventricle  and basilar cisterns are unchanged. There has been some progression of moderately severe small vessel disease throughout the periventricular white matter however. No acute hemorrhage, mass lesion, or infarction is evident. On bone window images, no calvarial abnormality is seen. The paranasal sinuses that are visualized are well pneumatized.  IMPRESSION: Some progression of small vessel disease throughout the periventricular white matter. No acute intracranial abnormality. Diffuse atrophy   Electronically Signed   By: Ivar Drape M.D.   On: 07/30/2014 14:50     EKG Interpretation   Date/Time:  Wednesday July 30 2014 15:40:58 EDT Ventricular Rate:  64 PR Interval:  166 QRS Duration: 152 QT Interval:  470 QTC Calculation: 485 R Axis:   -50 Text Interpretation:  Sinus rhythm Left bundle branch block No significant  change since last tracing Confirmed by Cashmere Harmes  MD, Cesar Rogerson ([id number]) on  07/30/2014 3:45:35 PM      MDM   Final diagnoses:  Falls  Cough  Compression fracture    12:50 PM 81 y.o. male w hx of copd, DVT, alzheimers who presents with mechanical falls. His wife notes that he had a mechanical fall in bathroom on Sunday. She did not witness this fall but he states that he fell onto his buttocks and back hitting his head. No loss of consciousness. He denies any headaches. He had another fall today while walking to the bathroom. He fell onto his buttocks again but denies hitting his head. He currently complains of lower  back and tailbone pain. We'll get screening labs and imaging.  3:48 PM: Pt found to have some compression fx's. Pain is only mild and easily treated with Tylenol. The patient would not like a stronger pain medicine. He is able to remove the walker at his baseline.  I have discussed the diagnosis/risks/treatment options with the patient and believe the pt to be eligible for discharge home to follow-up with his pcp as needed. We also discussed returning to the ED immediately if new or worsening sx occur. We discussed the sx which are most concerning (e.g., worsening pain, weakness, recurrent falls) that necessitate immediate return. Medications administered to the patient during their visit and any new prescriptions provided to the patient are listed below.  Medications given during this visit Medications  sodium chloride 0.9 % bolus 500 mL (500 mLs Intravenous New Bag/Given 07/30/14 1329)  acetaminophen (TYLENOL) tablet 650 mg (650 mg Oral Given 07/30/14 1324)    New Prescriptions   No medications on file     Pamella Pert, MD 07/30/14 1549

## 2014-08-05 ENCOUNTER — Emergency Department (HOSPITAL_COMMUNITY): Payer: PPO

## 2014-08-05 ENCOUNTER — Encounter (HOSPITAL_COMMUNITY): Payer: Self-pay | Admitting: Family Medicine

## 2014-08-05 ENCOUNTER — Emergency Department (HOSPITAL_COMMUNITY)
Admission: EM | Admit: 2014-08-05 | Discharge: 2014-08-05 | Disposition: A | Discharge status: Home / Self Care | Payer: PPO | Attending: Emergency Medicine | Admitting: Emergency Medicine

## 2014-08-05 DIAGNOSIS — Z8739 Personal history of other diseases of the musculoskeletal system and connective tissue: Secondary | ICD-10-CM | POA: Insufficient documentation

## 2014-08-05 DIAGNOSIS — I10 Essential (primary) hypertension: Secondary | ICD-10-CM | POA: Insufficient documentation

## 2014-08-05 DIAGNOSIS — Z880 Allergy status to penicillin: Secondary | ICD-10-CM | POA: Diagnosis not present

## 2014-08-05 DIAGNOSIS — J449 Chronic obstructive pulmonary disease, unspecified: Secondary | ICD-10-CM | POA: Insufficient documentation

## 2014-08-05 DIAGNOSIS — F329 Major depressive disorder, single episode, unspecified: Secondary | ICD-10-CM | POA: Insufficient documentation

## 2014-08-05 DIAGNOSIS — S32050A Wedge compression fracture of fifth lumbar vertebra, initial encounter for closed fracture: Secondary | ICD-10-CM | POA: Insufficient documentation

## 2014-08-05 DIAGNOSIS — Y998 Other external cause status: Secondary | ICD-10-CM | POA: Diagnosis not present

## 2014-08-05 DIAGNOSIS — Y9389 Activity, other specified: Secondary | ICD-10-CM | POA: Diagnosis not present

## 2014-08-05 DIAGNOSIS — Z8639 Personal history of other endocrine, nutritional and metabolic disease: Secondary | ICD-10-CM | POA: Insufficient documentation

## 2014-08-05 DIAGNOSIS — Z87891 Personal history of nicotine dependence: Secondary | ICD-10-CM | POA: Insufficient documentation

## 2014-08-05 DIAGNOSIS — R21 Rash and other nonspecific skin eruption: Secondary | ICD-10-CM | POA: Insufficient documentation

## 2014-08-05 DIAGNOSIS — F0280 Dementia in other diseases classified elsewhere without behavioral disturbance: Secondary | ICD-10-CM | POA: Insufficient documentation

## 2014-08-05 DIAGNOSIS — Z86718 Personal history of other venous thrombosis and embolism: Secondary | ICD-10-CM | POA: Diagnosis not present

## 2014-08-05 DIAGNOSIS — Z8546 Personal history of malignant neoplasm of prostate: Secondary | ICD-10-CM | POA: Diagnosis not present

## 2014-08-05 DIAGNOSIS — Y9289 Other specified places as the place of occurrence of the external cause: Secondary | ICD-10-CM | POA: Insufficient documentation

## 2014-08-05 DIAGNOSIS — Z91040 Latex allergy status: Secondary | ICD-10-CM | POA: Insufficient documentation

## 2014-08-05 DIAGNOSIS — S3992XA Unspecified injury of lower back, initial encounter: Secondary | ICD-10-CM | POA: Diagnosis present

## 2014-08-05 DIAGNOSIS — G309 Alzheimer's disease, unspecified: Secondary | ICD-10-CM | POA: Diagnosis not present

## 2014-08-05 DIAGNOSIS — Z8701 Personal history of pneumonia (recurrent): Secondary | ICD-10-CM | POA: Diagnosis not present

## 2014-08-05 DIAGNOSIS — W19XXXA Unspecified fall, initial encounter: Secondary | ICD-10-CM

## 2014-08-05 DIAGNOSIS — R5381 Other malaise: Secondary | ICD-10-CM

## 2014-08-05 DIAGNOSIS — W231XXA Caught, crushed, jammed, or pinched between stationary objects, initial encounter: Secondary | ICD-10-CM | POA: Insufficient documentation

## 2014-08-05 DIAGNOSIS — S32000A Wedge compression fracture of unspecified lumbar vertebra, initial encounter for closed fracture: Secondary | ICD-10-CM

## 2014-08-05 DIAGNOSIS — Z79899 Other long term (current) drug therapy: Secondary | ICD-10-CM | POA: Insufficient documentation

## 2014-08-05 LAB — BASIC METABOLIC PANEL
Anion gap: 7 (ref 5–15)
BUN: 33 mg/dL — ABNORMAL HIGH (ref 6–23)
CO2: 27 mmol/L (ref 19–32)
Calcium: 9 mg/dL (ref 8.4–10.5)
Chloride: 105 mmol/L (ref 96–112)
Creatinine, Ser: 1.54 mg/dL — ABNORMAL HIGH (ref 0.50–1.35)
GFR calc Af Amer: 44 mL/min — ABNORMAL LOW (ref >90)
GFR calc non Af Amer: 38 mL/min — ABNORMAL LOW (ref >90)
Glucose, Bld: 89 mg/dL (ref 70–99)
Potassium: 4.3 mmol/L (ref 3.5–5.1)
Sodium: 139 mmol/L (ref 135–145)

## 2014-08-05 LAB — URINALYSIS, ROUTINE W REFLEX MICROSCOPIC
Bilirubin Urine: NEGATIVE
Glucose, UA: NEGATIVE mg/dL
Hgb urine dipstick: NEGATIVE
Ketones, ur: NEGATIVE mg/dL
Leukocytes, UA: NEGATIVE
Nitrite: NEGATIVE
Protein, ur: NEGATIVE mg/dL
Specific Gravity, Urine: 1.025 (ref 1.005–1.030)
Urobilinogen, UA: 0.2 mg/dL (ref 0.0–1.0)
pH: 5 (ref 5.0–8.0)

## 2014-08-05 LAB — CBC WITH DIFFERENTIAL/PLATELET
Basophils Absolute: 0 10*3/uL (ref 0.0–0.1)
Basophils Relative: 0 % (ref 0–1)
Eosinophils Absolute: 0.1 10*3/uL (ref 0.0–0.7)
Eosinophils Relative: 2 % (ref 0–5)
HCT: 41 % (ref 39.0–52.0)
Hemoglobin: 13.3 g/dL (ref 13.0–17.0)
Lymphocytes Relative: 13 % (ref 12–46)
Lymphs Abs: 1.2 10*3/uL (ref 0.7–4.0)
MCH: 27.8 pg (ref 26.0–34.0)
MCHC: 32.4 g/dL (ref 30.0–36.0)
MCV: 85.6 fL (ref 78.0–100.0)
Monocytes Absolute: 0.7 10*3/uL (ref 0.1–1.0)
Monocytes Relative: 8 % (ref 3–12)
Neutro Abs: 6.7 10*3/uL (ref 1.7–7.7)
Neutrophils Relative %: 77 % (ref 43–77)
Platelets: 135 10*3/uL — ABNORMAL LOW (ref 150–400)
RBC: 4.79 MIL/uL (ref 4.22–5.81)
RDW: 15.8 % — ABNORMAL HIGH (ref 11.5–15.5)
WBC: 8.8 10*3/uL (ref 4.0–10.5)

## 2014-08-05 MED ORDER — SODIUM CHLORIDE 0.9 % IV BOLUS (SEPSIS)
1000.0000 mL | Freq: Once | INTRAVENOUS | Status: AC
  Administered 2014-08-05: 1000 mL via INTRAVENOUS

## 2014-08-05 MED ORDER — TRAMADOL HCL 50 MG PO TABS: 50.0000 mg | ORAL_TABLET | Freq: Three times a day (TID) | ORAL | Status: DC | PRN

## 2014-08-05 NOTE — ED Notes (Signed)
Off floor for testing 

## 2014-08-05 NOTE — Progress Notes (Addendum)
CSW obtained authorization# [id number] from [employer] for snf placement. CSW left message for Ivin Booty [phone])  at [facility] place to confirm time pt can be admitted. Patient to be transferred by ptar. fl2 and ptar form in shadow chart. Patient wife to be updated with time once confirmed with [facility] place. CSW to update patient once plans finalized.   Belia Heman, LCSW  Clinical Social Work  [employer] [phone]   CSW met with pt and pt wife at bedside. Pt alert and oriented however hard of hearing. Pt requested CSW to speak to wife regarding more specifics as it is difficult for him to hear. CSW updated patient regarding conversation as well. Pt wife shared that patient has increased weakness, has had multiple falls, and is not eating. Patient states he's hungry now and requesting hot soup. Pt currently has labs pending per nurse, and unable to eat anything at this time. Pt wife shared that patient will only take a few bites when he says he's hungry. Pt wife shared that pt has been to [facility] place previously and would like patient to return to short term rehab. Patient wife stated patient was there until mid February for 3 weeks and has been home with [facility] since. Patient wife states that she has a [facility] aid for 4-5 hours when she is at dialysis. Patient wife states that he also receives physical therapy from [facility]. CSW discussed current admission status and pending labs to determine pt disposition. CSW and wife to discuss further options once further medical evaluation is completed. CSW contacting [facility] to discuss potential placement. CSW also looking at [facility] skilled nursing facilities as requested by pt. CSW to call patient wife with updates as she is leaving for dialysis appointment.   Belia Heman, LCSW  Clinical Social Work  [employer] [phone]   CSW discussed admission status with EDP. Pt  recommended for discharge to skilled nursing facility from ED. CSW spoke with [facility] who are offering bed for patient pending insurance authorization. Pt with medicare advantage plan requiring physical therapy evaluation. Pt eval order placed by EDP. CSW spoke with PT who will be able to evaluate patient this afternoon. CSW to consult with [employer] regarding insurance authorization at [phone]. They are aware of pt need, and will facilitate insurance authorization once PT evaluation completed.   Belia Heman, LCSW  Clinical Social Work  [employer] [phone]   CSW left message for Roderic Ovens at [facility] advantage regarding insurance authorization and informed that physical therapy eval has been completed. CSW updated [facility] and patient wife.   Belia Heman, [place] Work  [employer] [phone]

## 2014-08-05 NOTE — Progress Notes (Signed)
CSW spoke with Ivin Booty of [facility]. She requested that the pt's discharge summary be faxed to her at [phone] as soon as possible. CSW faxed the document.    Also, she faxed the CSW information regarding the patients code status.  CSW met with pt at bedside. Patient is alert and oriented. Patient informed CSW that he would like to be a Full Code Status. CSW reached out to sharon and informed her of the pt's decision. Ivin Booty faxed CSW the proper paperwork.   CSW educated pt about the meaning of being a "Full Code". CSW obtained the signature from pt. CSW faxed Full Code Document to [facility].  Ivin Booty states that no further paperwork is needed from [facility]. She states that the pt is welcomed back and his room will be [id number].  CSW called [facility] who states that they will be on their way shortly. CSW made Ivin Booty of [facility] aware. CSW made pt's nurse aware.  CSW called pt's wife and informed her that [facility] has been called and that the pt will be transported back to the facility shortly.  Patient informed CSW that he wanted a cup of water. CSW consulted with nurse who states that it is okay to get the pt water.   Patient did not express any concerns. Patient did not have any questions.  Willette Brace [phone] ED CSW 08/05/2014 4:54 PM

## 2014-08-05 NOTE — ED Provider Notes (Signed)
CSN: [id number]     Arrival date & time 08/05/14  0618 History   First MD Initiated Contact with Patient 08/05/14 [phone]     Chief Complaint  Patient presents with  . Fall     (Consider location/radiation/quality/duration/timing/severity/associated sxs/prior Treatment) HPI   81 year old male presenting with his wife for evaluation after a fall. Patient was trying to get out of bed but he lost his balance became stuck between the guard rail in the bed itself. Complaint of pain in his left hip and lower back. No acute numbness, tingling or focal loss of strength. Patient wife have larger concern to generalized weakness which has been progressive over the last 2-3 weeks. Anorexia. Patient states that he just has no appetite. No nausea or vomiting. Unintentional weight loss, although they cannot quantify specifically. Several recent falls and was evaluated for the same in the ED on 3/23. Pt lives in an apartment with his wife. He does have home PT a couple days a week but does not feel like this is enough. Wife on dialysis and gone for extended periods of time several days a week. He use to go to adult daycare but hasn't in two weeks because has been too weak to attend.   Dijuan Sleeth - wife. [phone].   Past Medical History  Diagnosis Date  . COPD (chronic obstructive pulmonary disease)   . GERD (gastroesophageal reflux disease)   . Prostate cancer     "had prostate removed and had chemo" (02/09/2012)  . Depression   . DVT (deep venous thrombosis)   . Alzheimer's disease   . High cholesterol   . Asthma   . Hypertension   . Pneumonia     "4-5 times thru my life" (02/09/2012)  . Chronic bronchitis     "get it q year" (02/09/2012)  . Exertional dyspnea     "sometimes" (02/09/2012)  . History of blood transfusion     "when prostate removed" (02/09/2012)  . Arthritis     "in my fingers"  (02/09/2012)   Past Surgical History  Procedure Laterality Date  . Tonsillectomy  ~ 1938  .  Appendectomy  1950's?  . Cataract extraction w/ intraocular lens  implant, bilateral    . Prostatectomy    . Tear duct probing      "have had it done twice; left eye only?" (02/09/2012)  . Eye surgery     History reviewed. No pertinent family history. History  Substance Use Topics  . Smoking status: Former Smoker -- 1.00 packs/day for 44 years    Types: Cigarettes  . Smokeless tobacco: Never Used     Comment: 02/09/2012 " stopped smoking cigarrette > 30 years ago"  . Alcohol Use: 1.2 oz/week    1 Cans of beer, 1 Shots of liquor per week     Comment: Drink "once in a while"    Review of Systems  All systems reviewed and negative, other than as noted in HPI.   Allergies  Eggs or egg-derived products; Latex; Peanuts; Penicillins; Erythromycin; Fish allergy; Other; Sulfa antibiotics; and Kenalog  Home Medications   Prior to Admission medications   Medication Sig Start Date End Date Taking? Authorizing Provider  Acetaminophen (APAP) 325 MG tablet Take 650 mg by mouth every 6 (six) hours as needed for pain.   Yes Historical Provider, MD  albuterol (PROVENTIL) (5 MG/ML) 0.5% nebulizer solution Take 0.5 mLs (2.5 mg total) by nebulization every 2 (two) hours as needed for wheezing. 02/13/12  Yes Haynes Dage  L York, PA-C  budesonide-formoterol (SYMBICORT) 80-4.5 MCG/ACT inhaler Inhale 2 puffs into the lungs 2 (two) times daily. For COPD   Yes Historical Provider, MD  cephALEXin (KEFLEX) 500 MG capsule Take 1 capsule (500 mg total) by mouth every 12 (twelve) hours. Take through 06/25/14 doses then discontinue. 06/20/14  Yes Modena Jansky, MD  cholecalciferol (VITAMIN D) 1000 UNITS tablet Take 1,000 Units by mouth daily. For supplement   Yes Historical Provider, MD  feeding supplement (ENSURE COMPLETE) LIQD Take 237 mLs by mouth 3 (three) times daily between meals. 02/13/12  Yes Marianne L York, PA-C  galantamine (RAZADYNE) 12 MG tablet Take 12 mg by mouth 2 (two) times daily. For dementia   Yes  Historical Provider, MD  metroNIDAZOLE (FLAGYL) 500 MG tablet Take 1 tablet (500 mg total) by mouth every 8 (eight) hours. Take through 06/25/14 doses then discontinue. 06/20/14  Yes Modena Jansky, MD  Multiple Vitamins-Minerals (CENTRUM SILVER PO) Take 1 tablet by mouth daily.   Yes Historical Provider, MD  multivitamin-lutein (OCUVITE-LUTEIN) CAPS Take 1 capsule by mouth daily.   Yes Historical Provider, MD  ondansetron (ZOFRAN) 4 MG tablet Take 4 mg by mouth 3 (three) times daily as needed for nausea or vomiting.    Yes Historical Provider, MD  oxybutynin (DITROPAN) 5 MG tablet Take 5 mg by mouth 2 (two) times daily. For overactive bladder   Yes Historical Provider, MD  pantoprazole (PROTONIX) 40 MG tablet Take 40 mg by mouth daily.   Yes Historical Provider, MD  polyvinyl alcohol (LIQUIFILM TEARS) 1.4 % ophthalmic solution Place 1 drop into both eyes as needed for dry eyes.   Yes Historical Provider, MD  sertraline (ZOLOFT) 100 MG tablet Take 150 mg by mouth daily. For depression.   Yes Historical Provider, MD  PROVENTIL HFA 108 (90 BASE) MCG/ACT inhaler INHALE 2 PUFFS BY MOUTH EVERY 6 HOURS AS NEEDED Patient taking differently: INHALE 2 PUFFS BY MOUTH EVERY 6 HOURS AS NEEDED for shortness of breath 11/04/13   Tiffany L Reed, DO   BP 139/66 mmHg  Pulse 61  Temp(Src) 97.4 F (36.3 C) (Oral)  Resp 18  Ht 5\' 8"  (1.727 m)  Wt 118 lb (53.524 kg)  BMI 17.95 kg/m2  SpO2 94% Physical Exam  Constitutional: He appears well-developed and well-nourished. No distress.  HENT:  Head: Normocephalic and atraumatic.  Eyes: Conjunctivae are normal. Right eye exhibits no discharge. Left eye exhibits no discharge.  Conjunctivitis.   Neck: Neck supple.  Cardiovascular: Normal rate, regular rhythm and normal heart sounds.  Exam reveals no gallop and no friction rub.   No murmur heard. Pulmonary/Chest: Effort normal and breath sounds normal. No respiratory distress.  Abdominal: Soft. He exhibits no  distension. There is no tenderness.  Musculoskeletal: He exhibits no edema or tenderness.  Mild lower back TTP upper lumbar region both midline and paraspinally. Strength 5/5 b/l LE. Sensation intact to light touch.   Neurological: He is alert.  Skin: Skin is warm and dry. Rash noted.  Scattered erythematous dry/scaly skin lesions.   Psychiatric: He has a normal mood and affect. His behavior is normal. Thought content normal.  Nursing note and vitals reviewed.   ED Course  Procedures (including critical care time) Labs Review Labs Reviewed - No data to display  Imaging Review Dg Lumbar Spine Complete  08/05/2014   CLINICAL DATA:  Lumbago following fall  EXAM: LUMBAR SPINE - COMPLETE 4+ VIEW  COMPARISON:  December 30, 2010  FINDINGS: Frontal, lateral,  spot lumbosacral lateral, and bilateral oblique views were obtained. There are 5 non-rib-bearing lumbar type vertebral bodies. Anterior wedging at L1 has progressed since the prior study. There is now anterior wedging at T12 which was not present on most recent prior study. There are no other fractures. Slight anterolisthesis of L5 on S1 is stable and probably due to underlying spondylosis and pars defects. There is no new spondylolisthesis. There is moderately severe disc space narrowing at L5-S1. Other disc spaces appear unremarkable. There is facet osteoarthritic change at L3-4, L4-5, and L5-S1 bilaterally with pars defects at L5 bilaterally.  IMPRESSION: Compared to most recent prior study from 2012, there is increased anterior wedging of the L1 vertebral body and now anterior wedging of the T12 vertebral body. No other fractures. Pars defects at L5 bilaterally with stable mild anterolisthesis of L5 on S1. No other spondylolisthesis. Osteoarthritic change is noted at L5-S1, moderately severe. Facet osteoarthritic change at several levels also noted.   Electronically Signed   By: Lowella Grip III M.D.   On: 08/05/2014 07:16   Dg Pelvis 1-2  Views  08/05/2014   CLINICAL DATA:  Pain following fall. Tenderness primarily on the left  EXAM: PELVIS - 1-2 VIEW  COMPARISON:  December 30, 2010  FINDINGS: There is no evidence of pelvic fracture or dislocation. There is mild symmetric narrowing of both hip joints. Bones are somewhat osteoporotic. No erosive change. There are multiple surgical clips in the pelvis.  IMPRESSION: No acute fracture or dislocation. Symmetric narrowing both hip joints. Postoperative change in the lower pelvis. Bones somewhat osteoporotic.   Electronically Signed   By: Lowella Grip III M.D.   On: 08/05/2014 07:12     EKG Interpretation None      MDM   Final diagnoses:  Physical deconditioning  Fall, initial encounter  Lumbar compression fracture, closed, initial encounter   81 year old male with lower back pain after fall. X-rays showing vertebral compression fractures of unclear chronicity. Also larger concerns generalized weakness and inability to take care of himself. We'll obtain these tests including urinalysis. Social work [place].  Case management and social work involved. Can go back to [facility] but needs PT evaluation in ED prior to this.       Virgel Manifold, MD 08/05/14 [phone]

## 2014-08-05 NOTE — ED Notes (Signed)
Bed: VX79 Expected date:  Expected time:  Means of arrival:  Comments: 81 yo M  Fall, hip pain

## 2014-08-05 NOTE — ED Notes (Signed)
Bed: IO27 Expected date:  Expected time:  Means of arrival:  Comments: Hold triage

## 2014-08-05 NOTE — ED Notes (Signed)
PT at bedside.

## 2014-08-05 NOTE — ED Notes (Signed)
Per EMS, pt is from home. He fell out of bed this morning. He got caught between the guard rail and stretcher. Complains of left hip pain and lower back pain. Pt placed in KED to stabilize hip and back pain. Denies any LOC.

## 2014-08-05 NOTE — Evaluation (Addendum)
Physical Therapy Evaluation Patient Details Name: [name] MRN: [id number] DOB: [birth year] Today's Date: 08/05/2014   History of Present Illness  81 yo male admitted with fall, T12-L1 comp fractures. Hx of Alz dementia, COPD, prostate cancer, HTN, DVT.   Clinical Impression  On eval, pt required Mod assist for bed mobility and Min assist to stand and take a few steps forwards then backwards with RW. Limited by weakness, pain. Recommend ST rehab at SNF. Pt reported loss of bowels while conversing with therapist at end of session-made RN aware.     Follow Up Recommendations SNF;Supervision/Assistance - 24 hour    Equipment Recommendations  None recommended by PT    Recommendations for Other Services       Precautions / Restrictions Precautions Precautions: Fall Restrictions Weight Bearing Restrictions: No      Mobility  Bed Mobility Overal bed mobility: Needs Assistance Bed Mobility: Supine to Sit;Sit to Supine     Supine to sit: Mod assist Sit to supine: Mod assist   General bed mobility comments: Assist for trunk and LEs. Increased time. Pt reported back pain with transition.   Transfers Overall transfer level: Needs assistance Equipment used: Rolling walker (2 wheeled) Transfers: Sit to/from Stand Sit to Stand: Min assist         General transfer comment: Assist to rise, stabilize, control descent. VCs safety, technique,hand placement  Ambulation/Gait Ambulation/Gait assistance: Min assist Ambulation Distance (Feet): 5 Feet (forwards, backwards) Assistive device: Rolling walker (2 wheeled) Gait Pattern/deviations: Step-through pattern;Decreased stride length     General Gait Details: Assist to stabilize and maneuver safely with walker. Unsteady and shaky.   Stairs            Wheelchair Mobility    Modified Rankin (Stroke Patients Only)       Balance Overall balance assessment: Needs assistance;History of Falls         Standing  balance support: Bilateral upper extremity supported;During functional activity Standing balance-Leahy Scale: Poor                               Pertinent Vitals/Pain Pain Assessment: Faces Faces Pain Scale: Hurts even more Pain Location: back with movement Pain Descriptors / Indicators: Aching;Sore;Sharp Pain Intervention(s): Monitored during session;Limited activity within patient's tolerance    Home Living   Living Arrangements: Spouse/significant other Available Help at Discharge: Family Type of Home: Apartment Home Access: Level entry     Home Layout: One level Home Equipment: Environmental consultant - 4 wheels      Prior Function Level of Independence: Needs assistance               Hand Dominance        Extremity/Trunk Assessment   Upper Extremity Assessment: Generalized weakness           Lower Extremity Assessment: Generalized weakness      Cervical / Trunk Assessment: Kyphotic  Communication   Communication: HOH  Cognition Arousal/Alertness: Awake/alert Behavior During Therapy: WFL for tasks assessed/performed Overall Cognitive Status: History of cognitive impairments - at baseline                      General Comments      Exercises        Assessment/Plan    PT Assessment Patient needs continued PT services  PT Diagnosis Difficulty walking;Generalized weakness;Acute pain   PT Problem List Decreased strength;Decreased activity tolerance;Decreased balance;Decreased mobility;Pain;Decreased cognition  PT Treatment Interventions DME instruction;Gait training;Functional mobility training;Therapeutic activities;Therapeutic exercise;Balance training;Patient/family education   PT Goals (Current goals can be found in the Care Plan section) Acute Rehab PT Goals Patient Stated Goal: none stated PT Goal Formulation: Patient unable to participate in goal setting Time For Goal Achievement: 08/19/14 Potential to Achieve Goals: Fair     Frequency Min 2X/week   Barriers to discharge        Co-evaluation               End of Session Equipment Utilized During Treatment: Gait belt Activity Tolerance: Patient limited by fatigue;Patient limited by pain Patient left: in bed;with call bell/phone within reach           Time: 1207-1229 PT Time Calculation (min) (ACUTE ONLY): 22 min   Charges:   PT Evaluation $Initial PT Evaluation Tier I: 1 Procedure     PT G Codes:        Weston Anna, MPT Pager: [phone]

## 2014-08-05 NOTE — Progress Notes (Signed)
  CARE MANAGEMENT ED NOTE 08/05/2014  Patient:  MACEDONIO, SCALLON   Account Number:  [id number]  Date Initiated:  08/05/2014  Documentation initiated by:  Jackelyn Poling  Subjective/Objective Assessment:   81 yr old healthteam, advantage pt admitted with fall, T12-L1 comp fractures. Hx of Alz dementia, COPD, prostate cancer, HTN, DVT.       Subjective/Objective Assessment Detail:   Previous stay at [facility] place d/c from same in February 2016  Pt has presently only home health RN Previoiusly with OT/PT/ST per [facility] staff at 1335     Action/Plan:   ED SW consulted ED CM about possible home health needs if pt not able to return to [place] consulted [facility] to see what active services pt has in home   Action/Plan Detail:   Anticipated DC Date:  08/05/2014     Status Recommendation to Physician:   Result of Recommendation:    Other ED Services  Consult Working Plan   In-house referral  [place] - Pt will follow up   [place]   Choice offered to / List presented to:  C-3 Spouse     HH arranged  HH-1 Therapist, sports      [place].    Status of service:  Completed, signed off  ED Comments:   ED Comments Detail:

## 2014-08-05 NOTE — Progress Notes (Signed)
   08/05/14 1241  PT Time Calculation  PT Start Time (ACUTE ONLY) 1207  PT Stop Time (ACUTE ONLY) 1229  PT Time Calculation (min) (ACUTE ONLY) 22 min  PT G-Codes **NOT FOR INPATIENT CLASS**  Functional Assessment Tool Used (clinical judgement)  Functional Limitation Mobility: Walking and moving around  Mobility: Walking and Moving Around Current Status (X7741) CJ  Mobility: Walking and Moving Around Goal Status (S2395) CI  PT General Charges  $$ ACUTE PT VISIT 1 Procedure  PT Evaluation  $Initial PT Evaluation Tier I 1 Procedure   Weston Anna, MPT [phone]

## 2014-08-05 NOTE — Discharge Instructions (Signed)
Deconditioning Deconditioning refers to the changes in your body that occur during a period of inactivity. Deconditioning results in changes to your heart, lungs, and muscles. These changes decrease your ability to endure activity, resulting in feelings of fatigue and weakness. Deconditioning can occur after only a few days of bed rest or inactivity. The longer the period of inactivity, the more severe your symptoms of deconditioning will be. After longer periods of inactivity, it will also take longer for you to return to your previous level of functioning. Deconditioning can be mild, moderate, or severe:  Mild. Your condition interferes with your ability to perform your usual types of exercise, such as running, biking, or swimming.  Moderate. Your condition interferes with your ability to do normal everyday activities. This may include walking, grocery shopping, or doing chores or lawn work.  Severe. Your condition interferes with your ability to perform minimal activity or normal self-care. CAUSES  Some common reasons for inactivity that may result in deconditioning include:  Illnesses, such as cancer, stroke, heart attack, fibromyalgia, or chronic fatigue syndrome.  Injuries, especially back injuries, broken bones, or injury to ligaments or tendons.  Surgery or a long stay in the hospital for any reason.  Pregnancy, especially with conditions that require long periods of bed rest. RISK FACTORS Anything that results in a period of hospitalization or bed rest will put you at risk of deconditioning. Some other factors that can increase the risk include:  Obesity.  Poor nutrition.  Old age.  Injuries or illnesses that interfere with movement and activity. SIGNS AND SYMPTOMS  Feeling weak.  Feeling tired.  Shortness of breath with minor exertion.  Your heart beating faster than normal. You may or may not notice this without taking your pulse.  Pain or discomfort with  activity.  Decreased strength.  Decreased sense of balance.  Decreased endurance.  Difficulty participating in usual forms of exercise.  Difficulty doing activities of daily living, such as grocery shopping or chores.  Difficulty walking around the house and doing basic self-care, such as getting to the bathroom, preparing meals, or doing laundry. DIAGNOSIS  There is no specific test to diagnose deconditioning. Your health care provider will take your medical history and do a physical exam. During the physical exam, the health care provider will check for signs of deconditioning, such as:  Decreased size of muscles.  Decreased strength.  Difficulty with balance.  Shortness of breath or abnormally increased heart rate after minor exertion. TREATMENT  Treatment usually involves a structured exercise program in which activity is increased gradually. Your health care provider will determine which exercises are right for you. The exercise program will likely include aerobic exercise and strength training. Aerobic exercise helps improve the functioning of the heart and lungs as well as the muscles. Strength training helps improve muscle size and strength. Both of these types of exercise will improve your endurance. You may be referred to a physical therapist who can create a safe strengthening program for you to follow. HOME CARE INSTRUCTIONS  Follow the exercise program recommended by your health care provider or physical therapist.  Do not increase your exercise any faster than directed.  Eat a healthy diet.  If your health care provider thinks that you need to lose weight, consider seeing a dietitian to help you do so in a healthy way.  Do not use any tobacco products, including cigarettes, chewing tobacco, or electronic cigarettes. If you need help quitting, ask your health care provider.  Take  medicines only as directed by your health care provider.  Keep all follow-up visits as  directed by your health care provider. This is important. SEEK MEDICAL CARE IF:  You are not able to carry out the prescribed exercise program.  You are not able to carry out your usual level of activity.  You are having trouble doing normal household chores or caring for yourself.  You are becoming increasingly fatigued and weak.  You become light-headed when rising to a sitting or standing position.  Your level of endurance decreases after having improved. SEEK IMMEDIATE MEDICAL CARE IF:  You have chest pain.  You are very short of breath.  You have any episodes of passing out. Document Released: 09/09/2013 Document Reviewed: 04/30/2013 [facility] Patient Information 2015 [place], [place]. This information is not intended to replace advice given to you by your health care provider. Make sure you discuss any questions you have with your health care provider.  Back, Compression Fracture A compression fracture happens when a force is put upon the length of your spine. Slipping and falling on your bottom are examples of such a force. When this happens, sometimes the force is great enough to compress the building blocks (vertebral bodies) of your spine. Although this causes a lot of pain, this can usually be treated at home, unless your caregiver feels hospitalization is needed for pain control. Your backbone (spinal column) is made up of 24 main vertebral bodies in addition to the sacrum and coccyx (see illustration). These are held together by tough fibrous tissues (ligaments) and by support of your muscles. Nerve roots pass through the openings between the vertebrae. A sudden wrenching move, injury, or a fall may cause a compression fracture of one of the vertebral bodies. This may result in back pain or spread of pain into the belly (abdomen), the buttocks, and down the leg into the foot. Pain may also be created by muscle spasm alone. Large studies have been undertaken to determine the best  possible course of action to help your back following injury and also to prevent future problems. The recommendations are as follows. FOLLOWING A COMPRESSION FRACTURE: Do the following only if advised by your caregiver.   If a back brace has been suggested or provided, wear it as directed.  Do not stop wearing the back brace unless instructed by your caregiver.  When allowed to return to regular activities, avoid a sedentary lifestyle. Actively exercise. Sporadic weekend binges of tennis, racquetball, or waterskiing may actually aggravate or create problems, especially if you are not in condition for that activity.  Avoid sports requiring sudden body movements until you are in condition for them. Swimming and walking are safer activities.  Maintain good posture.  Avoid obesity.  If not already done, you should have a DEXA scan. Based on the results, be treated for osteoporosis. FOLLOWING ACUTE (SUDDEN) INJURY:  Only take over-the-counter or prescription medicines for pain, discomfort, or fever as directed by your caregiver.  Use bed rest for only the most extreme acute episode. Prolonged bed rest may aggravate your condition. Ice used for acute conditions is effective. Use a large plastic bag filled with ice. Wrap it in a towel. This also provides excellent pain relief. This may be continuous. Or use it for 30 minutes every 2 hours during acute phase, then as needed. Heat for 30 minutes prior to activities is helpful.  As soon as the acute phase (the time when your back is too painful for you to do  normal activities) is over, it is important to resume normal activities and work [employer] information [employer] manager. Back injuries can cause potentially marked changes in lifestyle. So it is important to attack these problems aggressively.  See your caregiver for continued problems. He or she can help or refer you for appropriate exercises, physical therapy, and work hardening if needed.  If you are given  narcotic medications for your condition, for the next 24 hours do not:  Drive.  Operate machinery or power tools.  Sign legal documents.  Do not drink alcohol, or take sleeping pills or other medications that may interfere with treatment. If your caregiver has given you a follow-up appointment, it is very important to keep that appointment. Not keeping the appointment could result in a chronic or permanent injury, pain, and disability. If there is any problem keeping the appointment, you must call back to this facility for assistance.  SEEK IMMEDIATE MEDICAL CARE IF:  You develop numbness, tingling, weakness, or problems with the use of your arms or legs.  You develop severe back pain not relieved with medications.  You have changes in bowel or bladder control.  You have increasing pain in any areas of the body. Document Released: 04/25/2005 Document Revised: 09/09/2013 Document Reviewed: 11/28/2007 [facility] Patient Information 2015 [place], [place]. This information is not intended to replace advice given to you by your health care provider. Make sure you discuss any questions you have with your health care provider.

## 2014-08-07 ENCOUNTER — Encounter: Payer: Self-pay | Admitting: Internal Medicine

## 2014-08-07 ENCOUNTER — Non-Acute Institutional Stay (SKILLED_NURSING_FACILITY): Payer: PPO | Admitting: Internal Medicine

## 2014-08-07 DIAGNOSIS — N3281 Overactive bladder: Secondary | ICD-10-CM | POA: Diagnosis not present

## 2014-08-07 DIAGNOSIS — J449 Chronic obstructive pulmonary disease, unspecified: Secondary | ICD-10-CM

## 2014-08-07 DIAGNOSIS — R5381 Other malaise: Secondary | ICD-10-CM

## 2014-08-07 DIAGNOSIS — F0390 Unspecified dementia without behavioral disturbance: Secondary | ICD-10-CM | POA: Diagnosis not present

## 2014-08-07 DIAGNOSIS — K219 Gastro-esophageal reflux disease without esophagitis: Secondary | ICD-10-CM | POA: Diagnosis not present

## 2014-08-07 DIAGNOSIS — E46 Unspecified protein-calorie malnutrition: Secondary | ICD-10-CM

## 2014-08-07 DIAGNOSIS — F329 Major depressive disorder, single episode, unspecified: Secondary | ICD-10-CM

## 2014-08-07 DIAGNOSIS — N189 Chronic kidney disease, unspecified: Secondary | ICD-10-CM

## 2014-08-07 DIAGNOSIS — F32A Depression, unspecified: Secondary | ICD-10-CM

## 2014-08-07 DIAGNOSIS — M4850XS Collapsed vertebra, not elsewhere classified, site unspecified, sequela of fracture: Secondary | ICD-10-CM | POA: Diagnosis not present

## 2014-08-07 DIAGNOSIS — IMO0001 Reserved for inherently not codable concepts without codable children: Secondary | ICD-10-CM

## 2014-08-07 NOTE — Progress Notes (Signed)
Patient ID: [name], male   DOB: Nov 11, 1922, 81 y.o.   MRN: [id number]     [facility]   PCP: FRIED, Jaymes Graff, MD  Code Status: full code  Allergies  Allergen Reactions  . Eggs Or Egg-Derived Products Nausea And Vomiting and Rash  . Latex Rash  . Peanuts [Peanut Oil] Anaphylaxis and Swelling    "swelling of the face"  . Penicillins Rash    Tolerates ceftriaxone  . Erythromycin     "I don't remember"  . Fish Allergy     "I never eat fish except for halibut"  . Other Other (See Comments)    [place] Beans- Throat swelling   . Sulfa Antibiotics Other (See Comments)    Reaction unknown  . Kenalog [Triamcinolone Acetonide] Rash    Chief Complaint  Patient presents with  . New Admit To SNF     HPI:  81 year old patient is here for short term rehabilitation post ED visit on 08/05/14 after a fall with lower back pain. xrays showed vertebral compression fractures of unclear chronicity. He was also noted to have generalized weakness. He was seen by therapy team in ED and recommended SNF for rehabilitation. Of note he was here STR post hospital admission from 06/16/14-06/20/14 with RLL bronchiectasis/ aspiration with superimposed pneumonia. He made improvement and was discharged home.  He has past medical history of COPD, GERD, prostate cancer, depression, DVT, Alzheimer's, HLD, HTN. He is seen in his room today. He is on oxygen, in no distress and denies any concerns.  Review of Systems:  Constitutional: Negative for fever, chills. Positive for malaise/fatigue.  HENT: Negative for headache, congestion. Has clear runny nose. Eyes: Negative for eye pain, blurred vision, double vision and discharge. has glasses Respiratory: Negative for cough, shortness of breath and wheezing. On o2   Cardiovascular: Negative for chest pain, palpitations, leg swelling.  Gastrointestinal: Negative for heartburn, nausea, vomiting, abdominal pain Genitourinary: Negative  for dysuria Musculoskeletal: Negative for back pain, falls in facility but is a high fall risk Skin: Negative for itching, rash.  Neurological: Negative for dizziness, tingling, focal weakness Psychiatric/Behavioral: positive for history of memory loss and depression.    Past Medical History  Diagnosis Date  . COPD (chronic obstructive pulmonary disease)   . GERD (gastroesophageal reflux disease)   . Prostate cancer     "had prostate removed and had chemo" (02/09/2012)  . Depression   . DVT (deep venous thrombosis)   . Alzheimer's disease   . High cholesterol   . Asthma   . Hypertension   . Pneumonia     "4-5 times thru my life" (02/09/2012)  . Chronic bronchitis     "get it q year" (02/09/2012)  . Exertional dyspnea     "sometimes" (02/09/2012)  . History of blood transfusion     "when prostate removed" (02/09/2012)  . Arthritis     "in my fingers"  (02/09/2012)   Past Surgical History  Procedure Laterality Date  . Tonsillectomy  ~ 1938  . Appendectomy  1950's?  . Cataract extraction w/ intraocular lens  implant, bilateral    . Prostatectomy    . Tear duct probing      "have had it done twice; left eye only?" (02/09/2012)  . Eye surgery     Social History:   reports that he has quit smoking. His smoking use included Cigarettes. He has a 44 pack-year smoking history. He has never used smokeless tobacco. He  reports that he drinks about 1.2 oz of alcohol per week. He reports that he does not use illicit drugs.  History reviewed. No pertinent family history.  Medications: Patient's Medications  New Prescriptions   No medications on file  Previous Medications   ACETAMINOPHEN (APAP) 325 MG TABLET    Take 650 mg by mouth every 6 (six) hours as needed for pain.   ALBUTEROL (PROVENTIL) (5 MG/ML) 0.5% NEBULIZER SOLUTION    Take 0.5 mLs (2.5 mg total) by nebulization every 2 (two) hours as needed for wheezing.   BUDESONIDE-FORMOTEROL (SYMBICORT) 80-4.5 MCG/ACT INHALER    Inhale 2  puffs into the lungs 2 (two) times daily. For COPD   CEPHALEXIN (KEFLEX) 500 MG CAPSULE    Take 1 capsule (500 mg total) by mouth every 12 (twelve) hours. Take through 06/25/14 doses then discontinue.   CHOLECALCIFEROL (VITAMIN D) 1000 UNITS TABLET    Take 1,000 Units by mouth daily. For supplement   FEEDING SUPPLEMENT (ENSURE COMPLETE) LIQD    Take 237 mLs by mouth 3 (three) times daily between meals.   GALANTAMINE (RAZADYNE) 12 MG TABLET    Take 12 mg by mouth 2 (two) times daily. For dementia   METRONIDAZOLE (FLAGYL) 500 MG TABLET    Take 1 tablet (500 mg total) by mouth every 8 (eight) hours. Take through 06/25/14 doses then discontinue.   MULTIPLE VITAMINS-MINERALS (CENTRUM SILVER PO)    Take 1 tablet by mouth daily.   MULTIVITAMIN-LUTEIN (OCUVITE-LUTEIN) CAPS    Take 1 capsule by mouth daily.   ONDANSETRON (ZOFRAN) 4 MG TABLET    Take 4 mg by mouth 3 (three) times daily as needed for nausea or vomiting.    OXYBUTYNIN (DITROPAN) 5 MG TABLET    Take 5 mg by mouth 2 (two) times daily. For overactive bladder   PANTOPRAZOLE (PROTONIX) 40 MG TABLET    Take 40 mg by mouth daily.   POLYVINYL ALCOHOL (LIQUIFILM TEARS) 1.4 % OPHTHALMIC SOLUTION    Place 1 drop into both eyes as needed for dry eyes.   PROVENTIL HFA 108 (90 BASE) MCG/ACT INHALER    INHALE 2 PUFFS BY MOUTH EVERY 6 HOURS AS NEEDED   SERTRALINE (ZOLOFT) 100 MG TABLET    Take 150 mg by mouth daily. For depression.   TRAMADOL (ULTRAM) 50 MG TABLET    Take 1 tablet (50 mg total) by mouth 3 (three) times daily as needed.  Modified Medications   No medications on file  Discontinued Medications   No medications on file     Physical Exam: Filed Vitals:   08/07/14 1100  BP: 128/66  Pulse: 82  Temp: 98.6 F (37 C)  Resp: 18  SpO2: 99%   General- elderly male, thin built, frail, in no acute distress Head- atraumatic, normocephalic Neck- no lymphadenopathy Cardiovascular- normal s1,s2, no murmurs, no leg edema Respiratory- bilateral  decreased air entry, no wheeze, no rhonchi, no crackles, on o2 Abdomen- bowel sounds present, soft, non tender Musculoskeletal- able to move all 4 extremities, generalized weakness Neurological- no focal deficit Psychiatry- alert and oriented to person and place, can tell the year but not the month, can carry normal conversation    Labs reviewed: Basic Metabolic Panel:  Recent Labs  06/20/14 0553 07/30/14 1320 08/05/14 0837  NA 137 139 139  K 4.4 4.3 4.3  CL 104 105 105  CO2 24 25 27   GLUCOSE 104* 115* 89  BUN 18 32* 33*  CREATININE 1.25 1.47* 1.54*  CALCIUM 9.0  9.1 9.0   Liver Function Tests:  Recent Labs  06/16/14 1428 07/30/14 1320  AST 22 20  ALT 11 15  ALKPHOS 64 52  BILITOT 1.1 1.2  PROT 7.5 7.4  ALBUMIN 4.1 4.3   No results for input(s): LIPASE, AMYLASE in the last 8760 hours. No results for input(s): AMMONIA in the last 8760 hours. CBC:  Recent Labs  06/16/14 1428  06/20/14 0553 07/30/14 1320 08/05/14 0837  WBC 10.9*  < > 7.3 10.0 8.8  NEUTROABS 9.4*  --   --  8.4* 6.7  HGB 13.7  < > 12.3* 14.2 13.3  HCT 42.8  < > 38.6* 42.7 41.0  MCV 85.6  < > 83.5 85.9 85.6  PLT 110*  < > 126* 110* 135*  < > = values in this interval not displayed.   Assessment/Plan  Physical deconditioning Will have him work with physical therapy and occupational therapy team to help with gait training and  muscle strengthening exercises.fall precautions. Skin care. Encourage to be out of bed.   Protein calorie malnutrition On megestrol to help stimulate appetite. Encourage po intake. Monitor weight. Continue skin care. On mechanical soft diet. Continue medpass supplements  Vertebral compression fracture Chronic, continue vitamin d supplement, high fall risk, continue prn tramadol and tylenol  Dementia Persists, decline anticipated, continue razadyne, MVI. Monitor weight  chronic renal failure monitor clinically  COPD Stable. Continue o2. Continue symbicort,  proventil  Depression Stable. Continue Zoloft 150 mg daily  gerd Continue protonix 40 mg daily  OAB On ditropan bid, high fall risk and has dementia, anticholinergic property of ditropan will worsen these,  thus change this to once a day and consider taking pt off it  Labs ordered: bmp in 1 week  Goals of care: short term rehabilitation  Family/ staff Communication: reviewed care plan with patient and nursing supervisor    Blanchie Serve, MD  [place] [phone] (Monday-Friday 8 am - 5 pm) [phone] (afterhours)

## 2014-08-25 ENCOUNTER — Other Ambulatory Visit: Payer: PPO

## 2014-08-25 ENCOUNTER — Other Ambulatory Visit: Payer: Self-pay | Admitting: Internal Medicine

## 2014-08-25 ENCOUNTER — Ambulatory Visit (INDEPENDENT_AMBULATORY_CARE_PROVIDER_SITE_OTHER): Payer: PPO | Admitting: Internal Medicine

## 2014-08-25 ENCOUNTER — Encounter: Payer: Self-pay | Admitting: Internal Medicine

## 2014-08-25 VITALS — BP 120/78 | HR 72 | Ht 68.0 in | Wt 118.6 lb

## 2014-08-25 DIAGNOSIS — J479 Bronchiectasis, uncomplicated: Secondary | ICD-10-CM

## 2014-08-25 DIAGNOSIS — K219 Gastro-esophageal reflux disease without esophagitis: Secondary | ICD-10-CM | POA: Diagnosis not present

## 2014-08-25 DIAGNOSIS — J42 Unspecified chronic bronchitis: Secondary | ICD-10-CM

## 2014-08-25 DIAGNOSIS — J189 Pneumonia, unspecified organism: Secondary | ICD-10-CM

## 2014-08-25 DIAGNOSIS — J471 Bronchiectasis with (acute) exacerbation: Secondary | ICD-10-CM

## 2014-08-25 DIAGNOSIS — H9193 Unspecified hearing loss, bilateral: Secondary | ICD-10-CM

## 2014-08-25 MED ORDER — FLUTTER DEVI: Status: DC

## 2014-08-25 NOTE — Progress Notes (Signed)
82 yoM former smoker refered Per Dr Velvet Bathe from hospital side-pt has had PNA x 2 in 2016. Currently resides at [facility]   Wife here  Known COPD ( 1 ppd x 44 years).GERD, prostate Ca, depression, hx DVT, Alzheimers. Concerned about pneumonia twice this winter and several times in past years. Aware of aspiration- on mechanical soft diet, thin liquids. Head of bed up.Cough "some days" with white or light brown sputum. No fever/ sweats. Denies waking choking. Always raspy breathing. O2 sat per wife usually around 94% on RA. Denies heart disease. No home O2.  CT chest 06/19/14-   I reviewed images IMPRESSION: 1. Progressive cystic bronchiectasis involving the right lower lobe with associated worsening interstitial consolidative airspace opacities - favored to represent the progression of chronic atypical infection (such as MAC), though ongoing aspiration could have a similar appearance. Clinical correlation is advised. 2. Nodules within the bilateral upper lobes are unchanged since the 2012 examination and thus of benign etiology, likely the sequela of prior infection and/or inflammation. 3. Incidentally noted cholelithiasis without evidence of cholecystitis. 4. Unchanged moderate (approximately 40%) compression deformity involving the superior endplate of the W10 vertebral body, similar to prior chest radiograph performed 06/2013. Signed  By: Sandi Mariscal M.D.  On: 06/19/2014 22:05 Ba Swallow 06/20/14- negative for aspiration CXR 07/30/14 IMPRESSION: COPD changes with bibasilar atelectasis. No acute abnormalities. Electronically Signed  By: Lavonia Dana M.D.  On: 07/30/2014 14:21  Prior to Admission medications   Medication Sig Start Date End Date Taking? Authorizing Provider  Acetaminophen (APAP) 325 MG tablet Take 650 mg by mouth every 6 (six) hours as needed for pain.   Yes Historical Provider, MD  albuterol (PROVENTIL) (5 MG/ML) 0.5% nebulizer solution Take 0.5 mLs (2.5 mg  total) by nebulization every 2 (two) hours as needed for wheezing. 02/13/12  Yes Marianne L York, PA-C  budesonide-formoterol (SYMBICORT) 80-4.5 MCG/ACT inhaler Inhale 2 puffs into the lungs 2 (two) times daily. For COPD   Yes Historical Provider, MD  cholecalciferol (VITAMIN D) 1000 UNITS tablet Take 1,000 Units by mouth daily. For supplement   Yes Historical Provider, MD  galantamine (RAZADYNE) 12 MG tablet Take 12 mg by mouth 2 (two) times daily. For dementia   Yes Historical Provider, MD  megestrol (MEGACE ES) 625 MG/5ML suspension Take 625 mg by mouth daily.   Yes Historical Provider, MD  Multiple Vitamins-Minerals (CENTRUM SILVER PO) Take 1 tablet by mouth daily.   Yes Historical Provider, MD  multivitamin-lutein (OCUVITE-LUTEIN) CAPS Take 1 capsule by mouth daily.   Yes Historical Provider, MD  ondansetron (ZOFRAN) 4 MG tablet Take 4 mg by mouth 3 (three) times daily as needed for nausea or vomiting.    Yes Historical Provider, MD  oxybutynin (DITROPAN) 5 MG tablet Take 5 mg by mouth 2 (two) times daily. For overactive bladder   Yes Historical Provider, MD  pantoprazole (PROTONIX) 40 MG tablet Take 40 mg by mouth daily.   Yes Historical Provider, MD  polyvinyl alcohol (LIQUIFILM TEARS) 1.4 % ophthalmic solution Place 1 drop into both eyes as needed for dry eyes.   Yes Historical Provider, MD  PROVENTIL HFA 108 (90 BASE) MCG/ACT inhaler INHALE 2 PUFFS BY MOUTH EVERY 6 HOURS AS NEEDED Patient taking differently: INHALE 2 PUFFS BY MOUTH EVERY 6 HOURS AS NEEDED for shortness of breath 11/04/13  Yes [place], DO  Respiratory Therapy Supplies (FLUTTER) DEVI Blow steadily through, 4 times per set.Repeat three sets daily 08/25/14   Deneise Lever,  MD  sertraline (ZOLOFT) 100 MG tablet Take 150 mg by mouth daily. For depression.   Yes Historical Provider, MD  traMADol (ULTRAM) 50 MG tablet Take 1 tablet (50 mg total) by mouth 3 (three) times daily as needed. 08/05/14  Yes Virgel Manifold, MD   Past  Medical History  Diagnosis Date  . COPD (chronic obstructive pulmonary disease)   . GERD (gastroesophageal reflux disease)   . Prostate cancer     "had prostate removed and had chemo" (02/09/2012)  . Depression   . DVT (deep venous thrombosis)   . Alzheimer's disease   . High cholesterol   . Asthma   . Hypertension   . Pneumonia     "4-5 times thru my life" (02/09/2012)  . Chronic bronchitis     "get it q year" (02/09/2012)  . Exertional dyspnea     "sometimes" (02/09/2012)  . History of blood transfusion     "when prostate removed" (02/09/2012)  . Arthritis     "in my fingers"  (02/09/2012)   Past Surgical History  Procedure Laterality Date  . Tonsillectomy  ~ 1938  . Appendectomy  1950's?  . Cataract extraction w/ intraocular lens  implant, bilateral    . Prostatectomy    . Tear duct probing      "have had it done twice; left eye only?" (02/09/2012)  . Eye surgery     No family history on file. History   Social History  . Marital Status: Married    Spouse Name: N/A  . Number of Children: N/A  . Years of Education: N/A   Occupational History  . Not on file.   Social History Main Topics  . Smoking status: Former Smoker -- 1.00 packs/day for 44 years    Types: Cigarettes  . Smokeless tobacco: Never Used     Comment: 02/09/2012 " stopped smoking cigarrette > 30 years ago"  . Alcohol Use: 1.2 oz/week    1 Cans of beer, 1 Shots of liquor per week     Comment: Drink "once in a while"  . Drug Use: No  . Sexual Activity: Not Currently   Other Topics Concern  . Not on file   Social History Narrative   ROS-see HPI   Negative unless "+" Constitutional:    +weight loss, night sweats, fevers, chills, fatigue, lassitude. HEENT:    headaches, +difficulty swallowing, tooth/dental problems, sore throat,       sneezing, itching, ear ache, nasal congestion, post nasal drip, snoring CV:    chest pain, orthopnea, PND, swelling in lower extremities, anasarca,                                   dizziness, palpitations Resp:   +shortness of breath with exertion or at rest.                +productive cough,   non-productive cough, coughing up of blood.              +change in color of mucus.  wheezing.   Skin:    rash or lesions. GI:  No-   heartburn, indigestion, abdominal pain, nausea, vomiting, diarrhea,                 change in bowel habits, loss of appetite GU: dysuria, change in color of urine, no urgency or frequency.   flank pain. MS:   joint pain, stiffness, decreased  range of motion, back pain. Neuro-     nothing unusual Psych:  change in mood or affect.  +depression or anxiety.   memory loss.  OBJ- Physical Exam General- Alert, Oriented, Affect-appropriate, Distress- none acute, +wheelchair Skin- rash-none, lesions- none, excoriation- none Lymphadenopathy- none Head- atraumatic            Eyes- Gross vision intact, PERRLA, conjunctivae and secretions clear, + bilateral ectropion            Ears- +Very hard of hearing/ hearing aid            Nose- Clear, no-Septal dev, mucus, polyps, erosion, perforation             Throat- Mallampati II , mucosa clear , drainage- none, tonsils- atrophic,  + dentures Neck- flexible , trachea midline, no stridor , thyroid nl, carotid no bruit Chest - symmetrical excursion , unlabored           Heart/CV- RRR , no murmur , no gallop  , no rub, nl s1 s2                           - JVD- none , edema- none, stasis changes- none, varices- none           Lung- +coarse rhonchi R base, wheeze- none, cough+raspy , dullness-none, rub- none           Chest wall-  Abd-  Br/ Gen/ Rectal- Not done, not indicated Extrem- cyanosis- none, clubbing, none, atrophy- none, strength- nl Neuro- grossly intact to observation

## 2014-08-25 NOTE — Patient Instructions (Signed)
Order- lab- Sputum culture- routine, fungal and AFB smear and culture                   Dx chronic bronchitis, bronchiectasis without exacerbation  Order- script printed for Flutter device: Blow through 4 times and repeat 3 times daily

## 2014-08-26 DIAGNOSIS — J479 Bronchiectasis, uncomplicated: Secondary | ICD-10-CM | POA: Insufficient documentation

## 2014-08-26 DIAGNOSIS — K219 Gastro-esophageal reflux disease without esophagitis: Secondary | ICD-10-CM | POA: Insufficient documentation

## 2014-08-26 DIAGNOSIS — H9190 Unspecified hearing loss, unspecified ear: Secondary | ICD-10-CM | POA: Insufficient documentation

## 2014-08-26 NOTE — Assessment & Plan Note (Signed)
Significant barrier to communication even with hearing aids and with wife to facilitate.

## 2014-08-26 NOTE — Assessment & Plan Note (Addendum)
Recurrent pneumonias strongly consistent with recurrent aspiration in this frail older man. Wife assures me he has had both pneumonia vaccines, but does not take flu shot due to egg intolerance. Note chronic PPI use (protonix) may be associated with increased pneumonia risk.

## 2014-08-26 NOTE — Assessment & Plan Note (Signed)
Over 44 pack year smoking history. He is too frail now to get useful PFT. Symptoms are primarily of chronic bronchitis pattern. Scheduled albuterol nebs are reasonable.

## 2014-08-26 NOTE — Assessment & Plan Note (Signed)
Now at baseline. This explains rhonchi RLL and is consistent with repeated aspriation .  Plan- sputum cultures obtained. Flutter device with teaching.

## 2014-08-26 NOTE — Assessment & Plan Note (Addendum)
Carried as a problem, associated with some hx dysphagia. Managed with reflux precautions and protonix.

## 2014-08-28 LAB — RESPIRATORY CULTURE OR RESPIRATORY AND SPUTUM CULTURE
Culture: NORMAL
ORGANISM ID, BACTERIA: NORMAL

## 2014-09-03 ENCOUNTER — Non-Acute Institutional Stay (SKILLED_NURSING_FACILITY): Payer: PPO | Admitting: Adult Health

## 2014-09-03 ENCOUNTER — Encounter: Payer: Self-pay | Admitting: Adult Health

## 2014-09-03 DIAGNOSIS — M4850XS Collapsed vertebra, not elsewhere classified, site unspecified, sequela of fracture: Secondary | ICD-10-CM | POA: Diagnosis not present

## 2014-09-03 DIAGNOSIS — R5381 Other malaise: Secondary | ICD-10-CM

## 2014-09-03 DIAGNOSIS — K219 Gastro-esophageal reflux disease without esophagitis: Secondary | ICD-10-CM

## 2014-09-03 DIAGNOSIS — N3281 Overactive bladder: Secondary | ICD-10-CM | POA: Diagnosis not present

## 2014-09-03 DIAGNOSIS — J449 Chronic obstructive pulmonary disease, unspecified: Secondary | ICD-10-CM

## 2014-09-03 DIAGNOSIS — IMO0001 Reserved for inherently not codable concepts without codable children: Secondary | ICD-10-CM

## 2014-09-03 DIAGNOSIS — R630 Anorexia: Secondary | ICD-10-CM | POA: Diagnosis not present

## 2014-09-03 DIAGNOSIS — F329 Major depressive disorder, single episode, unspecified: Secondary | ICD-10-CM

## 2014-09-03 DIAGNOSIS — F0390 Unspecified dementia without behavioral disturbance: Secondary | ICD-10-CM

## 2014-09-03 DIAGNOSIS — F32A Depression, unspecified: Secondary | ICD-10-CM

## 2014-09-03 NOTE — Progress Notes (Signed)
Patient ID: [name], male   DOB: October 29, 1922, 81 y.o.   MRN: [id number]   09/03/2014  Facility:  Nursing Home Location:  [place] Room Number: 1003-2 LEVEL OF CARE:  SNF (31)   Chief Complaint  Patient presents with  . Medical Management of Chronic Issues    Physical deconditioning, lumbar compression fracture, dementia, COPD, depression, GERD, overactive bladder and anorexia    HISTORY OF PRESENT ILLNESS:  This is an 81 year old male who is being admitted to [facility] on 08/05/14 from [facility]. He has PMH of COPD, GERD, prostate cancer, depression, Alzheimer's disease, asthma, hypertension, pneumonia and chronic bronchitis. He lost his balance at home and got stuck between the guardrail in the bed itself. He complained of left hip pain and x-ray shows lumbar compression fracture of unclear chronicity. Wife was concerned his having progressive generalized weakness, anorexia and weight loss.  He has been admitted for short-term rehabilitation.  He has gained approximately 3 lbs since admission. His current weight is 115.4 lbs (admission weight 112.4 lbs). He is currently taking megace. His COPD, Dementia and GERD has been stable.  PAST MEDICAL HISTORY:  Past Medical History  Diagnosis Date  . COPD (chronic obstructive pulmonary disease)   . GERD (gastroesophageal reflux disease)   . Prostate cancer     "had prostate removed and had chemo" (02/09/2012)  . Depression   . DVT (deep venous thrombosis)   . Alzheimer's disease   . High cholesterol   . Asthma   . Hypertension   . Pneumonia     "4-5 times thru my life" (02/09/2012)  . Chronic bronchitis     "get it q year" (02/09/2012)  . Exertional dyspnea     "sometimes" (02/09/2012)  . History of blood transfusion     "when prostate removed" (02/09/2012)  . Arthritis     "in my fingers"  (02/09/2012)    CURRENT MEDICATIONS: Reviewed per MAR/see medication list  Allergies    Allergen Reactions  . Eggs Or Egg-Derived Products Nausea And Vomiting and Rash  . Latex Rash  . Peanuts [Peanut Oil] Anaphylaxis and Swelling    "swelling of the face"  . Penicillins Rash    Tolerates ceftriaxone  . Erythromycin     "I don't remember"  . Fish Allergy     "I never eat fish except for halibut"  . Other Other (See Comments)    [place] Beans- Throat swelling   . Sulfa Antibiotics Other (See Comments)    Reaction unknown  . Kenalog [Triamcinolone Acetonide] Rash     REVIEW OF SYSTEMS:  GENERAL:  no fever, chills  RESPIRATORY: no wheezing, hemoptysis CARDIAC: no chest pain, edema or palpitations GI: no abdominal pain, diarrhea, constipation, heart burn, nausea or vomiting  PHYSICAL EXAMINATION  GENERAL: no acute distress EYES: conjunctivae normal, sclerae normal, normal eye lids NECK: supple, trachea midline, no neck masses, no thyroid tenderness, no thyromegaly LYMPHATICS: no LAN in the neck, no supraclavicular LAN RESPIRATORY: breathing is even & unlabored, BS CTAB CARDIAC: RRR, no murmur,no extra heart sounds, no edema GI: abdomen soft, normal BS, no masses, no tenderness, no hepatomegaly, no splenomegaly EXTREMITIES: able to move X 4 extremities PSYCHIATRIC: the patient is alert & oriented to person, affect & behavior appropriate  LABS/RADIOLOGY: Labs reviewed: 08/14/14  sodium 141 potassium 4.3 glucose 83 BUN 24 creatinine 1.33 calcium 8.4 Basic Metabolic Panel:  Recent Labs  06/20/14 0553 07/30/14 1320 08/05/14 0837  NA  137 139 139  K 4.4 4.3 4.3  CL 104 105 105  CO2 24 25 27   GLUCOSE 104* 115* 89  BUN 18 32* 33*  CREATININE 1.25 1.47* 1.54*  CALCIUM 9.0 9.1 9.0   Liver Function Tests:  Recent Labs  06/16/14 1428 07/30/14 1320  AST 22 20  ALT 11 15  ALKPHOS 64 52  BILITOT 1.1 1.2  PROT 7.5 7.4  ALBUMIN 4.1 4.3   CBC:  Recent Labs  06/16/14 1428  06/20/14 0553 07/30/14 1320 08/05/14 0837  WBC 10.9*  < > 7.3 10.0 8.8   NEUTROABS 9.4*  --   --  8.4* 6.7  HGB 13.7  < > 12.3* 14.2 13.3  HCT 42.8  < > 38.6* 42.7 41.0  MCV 85.6  < > 83.5 85.9 85.6  PLT 110*  < > 126* 110* 135*  < > = values in this interval not displayed.   Dg Lumbar Spine Complete  08/05/2014   CLINICAL DATA:  Lumbago following fall  EXAM: LUMBAR SPINE - COMPLETE 4+ VIEW  COMPARISON:  December 30, 2010  FINDINGS: Frontal, lateral, spot lumbosacral lateral, and bilateral oblique views were obtained. There are 5 non-rib-bearing lumbar type vertebral bodies. Anterior wedging at L1 has progressed since the prior study. There is now anterior wedging at T12 which was not present on most recent prior study. There are no other fractures. Slight anterolisthesis of L5 on S1 is stable and probably due to underlying spondylosis and pars defects. There is no new spondylolisthesis. There is moderately severe disc space narrowing at L5-S1. Other disc spaces appear unremarkable. There is facet osteoarthritic change at L3-4, L4-5, and L5-S1 bilaterally with pars defects at L5 bilaterally.  IMPRESSION: Compared to most recent prior study from 2012, there is increased anterior wedging of the L1 vertebral body and now anterior wedging of the T12 vertebral body. No other fractures. Pars defects at L5 bilaterally with stable mild anterolisthesis of L5 on S1. No other spondylolisthesis. Osteoarthritic change is noted at L5-S1, moderately severe. Facet osteoarthritic change at several levels also noted.   Electronically Signed   By: Lowella Grip III M.D.   On: 08/05/2014 07:16   Dg Pelvis 1-2 Views  08/05/2014   CLINICAL DATA:  Pain following fall. Tenderness primarily on the left  EXAM: PELVIS - 1-2 VIEW  COMPARISON:  December 30, 2010  FINDINGS: There is no evidence of pelvic fracture or dislocation. There is mild symmetric narrowing of both hip joints. Bones are somewhat osteoporotic. No erosive change. There are multiple surgical clips in the pelvis.  IMPRESSION: No acute  fracture or dislocation. Symmetric narrowing both hip joints. Postoperative change in the lower pelvis. Bones somewhat osteoporotic.   Electronically Signed   By: Lowella Grip III M.D.   On: 08/05/2014 07:12    ASSESSMENT/PLAN:  Physical deconditioning - continue rehabilitation Lumbar compression fracture - continue tramadol 50 mg 1 tab by mouth 3 times a day when necessary and Tylenol 650 mg by mouth every 6 hours when necessary for pain Dementia - continue Razadyne 12 mg 1 tab by mouth twice a day COPD - stable; continue Symbicort AP-4.5 g/ACT inhaled to puffs by mouth twice a day and Proventil AER HFA inhale 2 puffs by mouth every 6 hours when necessary Depression - mood is stable; continue Zoloft 150 mg by mouth daily GERD - continue Protonix 40 mg by mouth daily OAB - continue oxybutynin 5 mg by mouth daily Anorexia - continue Megace 625 mg/5  mL by mouth daily; discontinue MVI since patient is already on Preservision    Goals of care:  Short-term rehabilitation   Labs/test ordered:  CMP and [place], [place]

## 2014-09-04 ENCOUNTER — Telehealth: Payer: Self-pay | Admitting: Internal Medicine

## 2014-09-04 NOTE — Telephone Encounter (Signed)
Called and spoke to pt's wife. Informed her of the sputum results per CY. Pt's wife verbalized understanding and denied any further questions or concerns at this time.    Notes Recorded by Deneise Lever, MD on 08/28/2014 at 1:13 PM Sputum cultures so far just growing normal airway bacteria. Slower reports may not be final for 6 weeks.

## 2014-09-04 NOTE — Progress Notes (Signed)
Quick Note:  Called and spoke to pt's wife. Informed her of the sputum results per CY. Pt's wife verbalized understanding and denied any further questions or concerns at this time. ______

## 2014-09-22 LAB — FUNGUS CULTURE W SMEAR

## 2014-09-24 ENCOUNTER — Ambulatory Visit (INDEPENDENT_AMBULATORY_CARE_PROVIDER_SITE_OTHER)
Admission: RE | Admit: 2014-09-24 | Discharge: 2014-09-24 | Disposition: A | Discharge status: Home / Self Care | Payer: PPO | Source: Ambulatory Visit | Attending: Internal Medicine | Admitting: Internal Medicine

## 2014-09-24 ENCOUNTER — Encounter: Payer: Self-pay | Admitting: Internal Medicine

## 2014-09-24 ENCOUNTER — Ambulatory Visit (INDEPENDENT_AMBULATORY_CARE_PROVIDER_SITE_OTHER): Payer: PPO | Admitting: Internal Medicine

## 2014-09-24 VITALS — BP 100/66 | HR 72 | Ht 68.0 in | Wt 113.0 lb

## 2014-09-24 DIAGNOSIS — K219 Gastro-esophageal reflux disease without esophagitis: Secondary | ICD-10-CM | POA: Diagnosis not present

## 2014-09-24 DIAGNOSIS — R0902 Hypoxemia: Secondary | ICD-10-CM

## 2014-09-24 DIAGNOSIS — J690 Pneumonitis due to inhalation of food and vomit: Secondary | ICD-10-CM

## 2014-09-24 DIAGNOSIS — J189 Pneumonia, unspecified organism: Secondary | ICD-10-CM

## 2014-09-24 NOTE — Patient Instructions (Addendum)
Recheck oximetry on room air and O2  Order- CXR  Dx aspiration pneumonia

## 2014-09-24 NOTE — Assessment & Plan Note (Signed)
Significant mixed type COPD complicated by marked frailty and debilitation.

## 2014-09-24 NOTE — Assessment & Plan Note (Signed)
He desaturated initially with the exertion of standing from wheelchair onto scale and then settling back down again. He was placed temporarily on oxygen at this visit. On recheck at rest oxygen saturation had returned to 97%. He was not him evident distress.

## 2014-09-24 NOTE — Progress Notes (Signed)
38 yoM former smoker refered Per Dr Velvet Bathe from hospital side-pt has had PNA x 2 in 2016. Currently resides at [facility]   Wife here  Known COPD ( 1 ppd x 44 years).GERD, prostate Ca, depression, hx DVT, Alzheimers. Concerned about pneumonia twice this winter and several times in past years. Aware of aspiration- on mechanical soft diet, thin liquids. Head of bed up.Cough "some days" with white or light brown sputum. No fever/ sweats. Denies waking choking. Always raspy breathing. O2 sat per wife usually around 94% on RA. Denies heart disease. No home O2.  CT chest 06/19/14-   I reviewed images IMPRESSION: 1. Progressive cystic bronchiectasis involving the right lower lobe with associated worsening interstitial consolidative airspace opacities - favored to represent the progression of chronic atypical infection (such as MAC), though ongoing aspiration could have a similar appearance. Clinical correlation is advised. 2. Nodules within the bilateral upper lobes are unchanged since the 2012 examination and thus of benign etiology, likely the sequela of prior infection and/or inflammation. 3. Incidentally noted cholelithiasis without evidence of cholecystitis. 4. Unchanged moderate (approximately 40%) compression deformity involving the superior endplate of the G29 vertebral body, similar to prior chest radiograph performed 06/2013. Signed  By: Sandi Mariscal M.D.  On: 06/19/2014 22:05 Ba Swallow 06/20/14- negative for aspiration CXR 07/30/14 IMPRESSION: COPD changes with bibasilar atelectasis. No acute abnormalities. Electronically Signed  By: Lavonia Dana M.D.  On: 07/30/2014 14:21  09/24/14- 59 yoM followed for recurrent pneumonia, bronchiectasis, suspected atypical infection, GERD with recurrent aspiration DX LAST [facility]. [facility]. NO CHANGE PT. POSSIBLY WORSE     wife here Pt incontinent of stool on arrival here- staff cleaned him up.  Hypoxic on exertion on  arrival and placed on O2.. With rest, room air saturation improved into the 90% range where his wife says it usually is at [facility]. Chest x-ray report from May 9 had shown patchy minimal right side pneumonia and his supervising physician at his nursing home treated with Levaquin 7 days. CXR 09/24/14- images reviewed-prominent markings and reticular nodular densities that may be scarring. IMPRESSION: There is no definite evidence of aspiration pneumonia. There are findings consistent with COPD with mild pulmonary interstitial prominence greatest on the right. There is no CHF. Electronically Signed  By: David Martin[place] M.D.  On: 09/24/2014 15:07 Sputum cultures from last visit so far are growing only normal flora  ROS-see HPI   Negative unless "+" Constitutional:    +weight loss, night sweats, fevers, chills, fatigue, lassitude. HEENT:    headaches, +difficulty swallowing, tooth/dental problems, sore throat,       sneezing, itching, ear ache, nasal congestion, post nasal drip, snoring CV:    chest pain, orthopnea, PND, swelling in lower extremities, anasarca,                                  dizziness, palpitations Resp:   +shortness of breath with exertion or at rest.                +productive cough,   non-productive cough, coughing up of blood.              change in color of mucus.  wheezing.   Skin:    rash or lesions. GI:  No-   heartburn, indigestion, abdominal pain, nausea, vomiting, diarrhea,  change in bowel habits, loss of appetite GU: dysuria, change in color of urine, no urgency or frequency.   flank pain. MS:   joint pain, stiffness, decreased range of motion, back pain. Neuro-     nothing unusual Psych:  change in mood or affect.  +depression or anxiety.   memory loss.  OBJ- Physical Exam   + very frail elderly man looking chronically ill General- Alert, Oriented, Affect-appropriate, Distress- none acute, +wheelchair Skin- + scaling Lymphadenopathy-  none Head- atraumatic            Eyes- Gross vision intact, PERRLA, conjunctivae and secretions clear,                        + bilateral ectropion            Ears- +Very hard of hearing/ without hearing aid            Nose- Clear, no-Septal dev, mucus, polyps, erosion, perforation             Throat- Mallampati II , mucosa clear , drainage- none, tonsils- atrophic,  + dentures Neck- flexible , trachea midline, no stridor , thyroid nl, carotid no bruit Chest - symmetrical excursion , unlabored           Heart/CV- RRR , no murmur , no gallop  , no rub, nl s1 s2                           - JVD- none , edema- none, stasis changes- none, varices- none           Lung- +coarse rhonchi R base, wheeze- none, cough+raspy , dullness-none, rub- none           Chest wall-  Abd-  Br/ Gen/ Rectal- Not done, not indicated Extrem- cyanosis- none, clubbing, none, atrophy- none, strength- nl Neuro- grossly intact to observation

## 2014-09-24 NOTE — Assessment & Plan Note (Signed)
Recurrent aspiration is the likeliest explanation for recurrent pneumonias in this frail elderly man with dentures. Plan-emphasis on reflux precautions.

## 2014-09-24 NOTE — Assessment & Plan Note (Signed)
Suspect recurrent aspiration pneumonitis. Plan-chest x-ray for follow-up at this visit. Renewed discussion of reflux precautions with wife.

## 2014-09-25 ENCOUNTER — Telehealth: Payer: Self-pay | Admitting: Internal Medicine

## 2014-09-25 NOTE — Telephone Encounter (Signed)
Result Note     CXR- no definite pneumonia now. Changes of COPD with some probable scarring.  --- Spouse aware of results. Nothing further needed

## 2014-10-01 LAB — BASIC METABOLIC PANEL
BUN: 31 mg/dL — AB (ref 4–21)
Creatinine: 1.4 mg/dL — AB (ref 0.6–1.3)
GLUCOSE: 79 mg/dL
POTASSIUM: 4.8 mmol/L (ref 3.4–5.3)
SODIUM: 139 mmol/L (ref 137–147)

## 2014-10-07 LAB — AFB CULTURE WITH SMEAR (NOT AT ARMC): Acid Fast Smear: NONE SEEN

## 2014-10-10 NOTE — Progress Notes (Signed)
Quick Note:  Called and spoke to pt's son. Informed him of the results per CY. Pt verbalized understanding and denied any further questions or concerns at this time.   ______

## 2014-10-29 ENCOUNTER — Encounter: Payer: Self-pay | Admitting: Adult Health

## 2014-10-29 ENCOUNTER — Non-Acute Institutional Stay: Payer: PPO | Admitting: Adult Health

## 2014-10-29 DIAGNOSIS — J449 Chronic obstructive pulmonary disease, unspecified: Secondary | ICD-10-CM | POA: Diagnosis not present

## 2014-10-29 DIAGNOSIS — IMO0001 Reserved for inherently not codable concepts without codable children: Secondary | ICD-10-CM

## 2014-10-29 DIAGNOSIS — F32A Depression, unspecified: Secondary | ICD-10-CM

## 2014-10-29 DIAGNOSIS — F0390 Unspecified dementia without behavioral disturbance: Secondary | ICD-10-CM

## 2014-10-29 DIAGNOSIS — M4850XS Collapsed vertebra, not elsewhere classified, site unspecified, sequela of fracture: Secondary | ICD-10-CM | POA: Diagnosis not present

## 2014-10-29 DIAGNOSIS — N3281 Overactive bladder: Secondary | ICD-10-CM

## 2014-10-29 DIAGNOSIS — E46 Unspecified protein-calorie malnutrition: Secondary | ICD-10-CM

## 2014-10-29 DIAGNOSIS — E559 Vitamin D deficiency, unspecified: Secondary | ICD-10-CM

## 2014-10-29 DIAGNOSIS — R630 Anorexia: Secondary | ICD-10-CM | POA: Diagnosis not present

## 2014-10-29 DIAGNOSIS — F329 Major depressive disorder, single episode, unspecified: Secondary | ICD-10-CM | POA: Diagnosis not present

## 2014-10-29 DIAGNOSIS — K219 Gastro-esophageal reflux disease without esophagitis: Secondary | ICD-10-CM | POA: Diagnosis not present

## 2014-10-29 NOTE — Progress Notes (Signed)
Patient ID: [name], male   DOB: 05/29/1922, 81 y.o.   MRN: [id number]   10/29/2014  Facility:  Nursing Home Location:  [facility] Room Number: 1003-2 LEVEL OF CARE:  SNF (31)   Chief Complaint  Patient presents with  . Medical Management of Chronic Issues    COPD, dementia, depression, vertebral compression fracture, GERD, overactive bladder, on the erection, protein calorie malnutrition and vitamin D deficiency    HISTORY OF PRESENT ILLNESS:  This is an 81 year old male who is being seen for a routine visit. He is now a long-term resident of [facility]. His mood is stable. He is currently taking Zoloft for depression. Dementia is stable and currently on Razadyne. His COPD is stable. He was recently treated for Pneumonia with Levaquin. He is now having restorative care.  He has PMH of COPD, GERD, prostate cancer, depression, Alzheimer's disease, asthma, hypertension, pneumonia and chronic bronchitis.  PAST MEDICAL HISTORY:  Past Medical History  Diagnosis Date  . COPD (chronic obstructive pulmonary disease)   . GERD (gastroesophageal reflux disease)   . Prostate cancer     "had prostate removed and had chemo" (02/09/2012)  . Depression   . DVT (deep venous thrombosis)   . Alzheimer's disease   . High cholesterol   . Asthma   . Hypertension   . Pneumonia     "4-5 times thru my life" (02/09/2012)  . Chronic bronchitis     "get it q year" (02/09/2012)  . Exertional dyspnea     "sometimes" (02/09/2012)  . History of blood transfusion     "when prostate removed" (02/09/2012)  . Arthritis     "in my fingers"  (02/09/2012)    CURRENT MEDICATIONS: Reviewed per MAR/see medication list  Allergies  Allergen Reactions  . Eggs Or Egg-Derived Products Nausea And Vomiting and Rash  . Latex Rash  . Peanuts [Peanut Oil] Anaphylaxis and Swelling    "swelling of the face"  . Penicillins Rash    Tolerates ceftriaxone  . Erythromycin     "I don't  remember"  . Fish Allergy     "I never eat fish except for halibut"  . Other Other (See Comments)    Lima Beans- Throat swelling   . Sulfa Antibiotics Other (See Comments)    Reaction unknown  . Kenalog [Triamcinolone Acetonide] Rash     REVIEW OF SYSTEMS:  GENERAL:  no fever, chills  RESPIRATORY: no wheezing, hemoptysis CARDIAC: no chest pain, edema or palpitations GI: no abdominal pain, diarrhea, constipation, heart burn, nausea or vomiting  PHYSICAL EXAMINATION  GENERAL: no acute distress NECK: supple, trachea midline, no neck masses, no thyroid tenderness, no thyromegaly LYMPHATICS: no LAN in the neck, no supraclavicular LAN RESPIRATORY: breathing is even & unlabored, BS CTAB CARDIAC: RRR, no murmur,no extra heart sounds, no edema GI: abdomen soft, normal BS, no masses, no tenderness, no hepatomegaly, no splenomegaly EXTREMITIES: able to move X 4 extremities PSYCHIATRIC: the patient is alert & oriented to person, affect & behavior appropriate  LABS/RADIOLOGY: Labs reviewed: 10/01/14  Sodium 139 potassium 4.8 glucose 79 BUN 31 creatinine 1.38 calcium 8.7 09/23/14  WBC 7.9 hemoglobin 11.1 hematocrit 33.1 MCV 81.7 platelet 124 sodium 141 potassium 4.4 glucose 91 BUN 35 creatinine 1.57 calcium 9.1 09/04/14  WBC 5.6 hemoglobin 12.5 hematocrit 36.6 MCV 92.4 platelet 194 sodium 134 potassium 3.9 glucose 81 BUN 14 creatinine 0.83 total bilirubin 01.0 alkaline phosphatase 58 SGOT 14 SGPT 8 total protein 5.6 albumin  3.1 calcium 8.1 08/14/14  sodium 141 potassium 4.3 glucose 83 BUN 24 creatinine 1.33 calcium 8.4 Basic Metabolic Panel:  Recent Labs  06/20/14 0553 07/30/14 1320 08/05/14 0837  NA 137 139 139  K 4.4 4.3 4.3  CL 104 105 105  CO2 24 25 27   GLUCOSE 104* 115* 89  BUN 18 32* 33*  CREATININE 1.25 1.47* 1.54*  CALCIUM 9.0 9.1 9.0   Liver Function Tests:  Recent Labs  06/16/14 1428 07/30/14 1320  AST 22 20  ALT 11 15  ALKPHOS 64 52  BILITOT 1.1 1.2  PROT 7.5 7.4   ALBUMIN 4.1 4.3   CBC:  Recent Labs  06/16/14 1428  06/20/14 0553 07/30/14 1320 08/05/14 0837  WBC 10.9*  < > 7.3 10.0 8.8  NEUTROABS 9.4*  --   --  8.4* 6.7  HGB 13.7  < > 12.3* 14.2 13.3  HCT 42.8  < > 38.6* 42.7 41.0  MCV 85.6  < > 83.5 85.9 85.6  PLT 110*  < > 126* 110* 135*  < > = values in this interval not displayed.    ASSESSMENT/PLAN:  Lumbar compression fracture - continue tramadol 50 mg 1 tab by mouth 3 times a day when necessary and Tylenol 650 mg by mouth every 6 hours when necessary for pain Dementia - continue Razadyne 12 mg 1 tab by mouth twice a day COPD - stable; continue Symbicort AP-4.5 g/ACT inhaled to puffs by mouth twice a day and Proventil AER HFA inhale 2 puffs by mouth every 6 hours when necessary Depression - mood is stable; continue Zoloft 150 mg by mouth daily GERD - continue Protonix 40 mg by mouth daily OAB - continue oxybutynin 5 mg by mouth daily Anorexia - continue Megace 625 mg/5 mL by mouth daily Protein calorie malnutrition, severe - albumin 3.1; continue supplementation Vitamin D deficiency - continue vitamin D 3 1000 unit take 1 tab by mouth daily     Goals of care:  Long-term care    [facility], NP [facility] [phone]

## 2014-11-20 ENCOUNTER — Encounter (HOSPITAL_COMMUNITY): Payer: Self-pay | Admitting: Emergency Medicine

## 2014-11-20 ENCOUNTER — Emergency Department (HOSPITAL_COMMUNITY)
Admission: EM | Admit: 2014-11-20 | Discharge: 2014-11-20 | Disposition: A | Discharge status: Home / Self Care | Payer: PPO | Attending: Emergency Medicine | Admitting: Emergency Medicine

## 2014-11-20 DIAGNOSIS — Z8659 Personal history of other mental and behavioral disorders: Secondary | ICD-10-CM | POA: Diagnosis not present

## 2014-11-20 DIAGNOSIS — G309 Alzheimer's disease, unspecified: Secondary | ICD-10-CM | POA: Insufficient documentation

## 2014-11-20 DIAGNOSIS — K219 Gastro-esophageal reflux disease without esophagitis: Secondary | ICD-10-CM | POA: Insufficient documentation

## 2014-11-20 DIAGNOSIS — M1990 Unspecified osteoarthritis, unspecified site: Secondary | ICD-10-CM | POA: Insufficient documentation

## 2014-11-20 DIAGNOSIS — K625 Hemorrhage of anus and rectum: Secondary | ICD-10-CM | POA: Diagnosis present

## 2014-11-20 DIAGNOSIS — Z8639 Personal history of other endocrine, nutritional and metabolic disease: Secondary | ICD-10-CM | POA: Insufficient documentation

## 2014-11-20 DIAGNOSIS — Z91040 Latex allergy status: Secondary | ICD-10-CM | POA: Diagnosis not present

## 2014-11-20 DIAGNOSIS — Z8546 Personal history of malignant neoplasm of prostate: Secondary | ICD-10-CM | POA: Diagnosis not present

## 2014-11-20 DIAGNOSIS — J449 Chronic obstructive pulmonary disease, unspecified: Secondary | ICD-10-CM | POA: Insufficient documentation

## 2014-11-20 DIAGNOSIS — I10 Essential (primary) hypertension: Secondary | ICD-10-CM | POA: Insufficient documentation

## 2014-11-20 DIAGNOSIS — Z79899 Other long term (current) drug therapy: Secondary | ICD-10-CM | POA: Diagnosis not present

## 2014-11-20 DIAGNOSIS — Z86718 Personal history of other venous thrombosis and embolism: Secondary | ICD-10-CM | POA: Insufficient documentation

## 2014-11-20 DIAGNOSIS — Z87891 Personal history of nicotine dependence: Secondary | ICD-10-CM | POA: Diagnosis not present

## 2014-11-20 DIAGNOSIS — Z880 Allergy status to penicillin: Secondary | ICD-10-CM | POA: Diagnosis not present

## 2014-11-20 DIAGNOSIS — Z8701 Personal history of pneumonia (recurrent): Secondary | ICD-10-CM | POA: Diagnosis not present

## 2014-11-20 LAB — CBC WITH DIFFERENTIAL/PLATELET
BASOS ABS: 0 10*3/uL (ref 0.0–0.1)
Basophils Relative: 0 % (ref 0–1)
Eosinophils Absolute: 0.2 10*3/uL (ref 0.0–0.7)
Eosinophils Relative: 2 % (ref 0–5)
HEMATOCRIT: 41.5 % (ref 39.0–52.0)
Hemoglobin: 13.5 g/dL (ref 13.0–17.0)
Lymphocytes Relative: 11 % — ABNORMAL LOW (ref 12–46)
Lymphs Abs: 1.4 10*3/uL (ref 0.7–4.0)
MCH: 28.7 pg (ref 26.0–34.0)
MCHC: 32.5 g/dL (ref 30.0–36.0)
MCV: 88.3 fL (ref 78.0–100.0)
Monocytes Absolute: 1.2 10*3/uL — ABNORMAL HIGH (ref 0.1–1.0)
Monocytes Relative: 9 % (ref 3–12)
NEUTROS ABS: 10.5 10*3/uL — AB (ref 1.7–7.7)
Neutrophils Relative %: 78 % — ABNORMAL HIGH (ref 43–77)
PLATELETS: 175 10*3/uL (ref 150–400)
RBC: 4.7 MIL/uL (ref 4.22–5.81)
RDW: 16.1 % — ABNORMAL HIGH (ref 11.5–15.5)
WBC: 13.4 10*3/uL — ABNORMAL HIGH (ref 4.0–10.5)

## 2014-11-20 LAB — URINALYSIS, ROUTINE W REFLEX MICROSCOPIC
Bilirubin Urine: NEGATIVE
GLUCOSE, UA: NEGATIVE mg/dL
HGB URINE DIPSTICK: NEGATIVE
KETONES UR: NEGATIVE mg/dL
LEUKOCYTES UA: NEGATIVE
Nitrite: NEGATIVE
Protein, ur: NEGATIVE mg/dL
Specific Gravity, Urine: 1.022 (ref 1.005–1.030)
Urobilinogen, UA: 0.2 mg/dL (ref 0.0–1.0)
pH: 6 (ref 5.0–8.0)

## 2014-11-20 LAB — TYPE AND SCREEN
ABO/RH(D): O POS
ANTIBODY SCREEN: NEGATIVE

## 2014-11-20 LAB — I-STAT CHEM 8, ED
BUN: 38 mg/dL — ABNORMAL HIGH (ref 6–20)
CALCIUM ION: 1.27 mmol/L (ref 1.13–1.30)
CREATININE: 1.6 mg/dL — AB (ref 0.61–1.24)
Chloride: 108 mmol/L (ref 101–111)
Glucose, Bld: 80 mg/dL (ref 65–99)
HCT: 42 % (ref 39.0–52.0)
HEMOGLOBIN: 14.3 g/dL (ref 13.0–17.0)
POTASSIUM: 4.2 mmol/L (ref 3.5–5.1)
Sodium: 143 mmol/L (ref 135–145)
TCO2: 24 mmol/L (ref 0–100)

## 2014-11-20 LAB — PROTIME-INR
INR: 1.11 (ref 0.00–1.49)
PROTHROMBIN TIME: 14.5 s (ref 11.6–15.2)

## 2014-11-20 LAB — ABO/RH: ABO/RH(D): O POS

## 2014-11-20 LAB — APTT: APTT: 24 s (ref 24–37)

## 2014-11-20 NOTE — Discharge Instructions (Signed)
Bloody Stools  Bloody stools often mean that there is a problem in the digestive tract. Your caregiver may use the term "melena" to describe black, tarry, and bad smelling stools or "hematochezia" to describe red or maroon-colored stools. Blood seen in the stool can be caused by bleeding anywhere along the intestinal tract.   A black stool usually means that blood is coming from the upper part of the gastrointestinal tract (esophagus, stomach, or small bowel). Passing maroon-colored stools or bright red blood usually means that blood is coming from lower down in the large bowel or the rectum. However, sometimes massive bleeding in the stomach or small intestine can cause bright red bloody stools.   Consuming black licorice, lead, iron pills, medicines containing bismuth subsalicylate, or blueberries can also cause black stools. Your caregiver can test black stools to see if blood is present.  It is important that the cause of the bleeding be found. Treatment can then be started, and the problem can be corrected. Rectal bleeding may not be serious, but you should not assume everything is okay until you know the cause. It is very important to follow up with your caregiver or a specialist in gastrointestinal problems.  CAUSES   Blood in the stools can come from various underlying causes. Often, the cause is not found during your first visit. Testing is often needed to discover the cause of bleeding in the gastrointestinal tract. Causes range from simple to serious or even life-threatening. Possible causes include:  · Hemorrhoids. These are veins that are full of blood (engorged) in the rectum. They cause pain, inflammation, and may bleed.  · Anal fissures. These are areas of painful tearing which may bleed. They are often caused by passing hard stool.  · Diverticulosis. These are pouches that form on the colon over time, with age, and may bleed significantly.  · Diverticulitis. This is inflammation in areas with  diverticulosis. It can cause pain, fever, and bloody stools, although bleeding is rare.  · Proctitis and colitis. These are inflamed areas of the rectum or colon. They may cause pain, fever, and bloody stools.  · Polyps and cancer. Colon cancer is a leading cause of preventable cancer death. It often starts out as precancerous polyps that can be removed during a colonoscopy, preventing progression into cancer. Sometimes, polyps and cancer may cause rectal bleeding.  · Gastritis and ulcers. Bleeding from the upper gastrointestinal tract (near the stomach) may travel through the intestines and produce black, sometimes tarry, often bad smelling stools. In certain cases, if the bleeding is fast enough, the stools may not be black, but red and the condition may be life-threatening.  SYMPTOMS   You may have stools that are bright red and bloody, that are normal color with blood on them, or that are dark black and tarry. In some cases, you may only have blood in the toilet bowl. Any of these cases need medical care. You may also have:  · Pain at the anus or anywhere in the rectum.  · Lightheadedness or feeling faint.  · Extreme weakness.  · Nausea or vomiting.  · Fever.  DIAGNOSIS  Your caregiver may use the following methods to find the cause of your bleeding:  · Taking a medical history. Age is important. Older people tend to develop polyps and cancer more often. If there is anal pain and a hard, large stool associated with bleeding, a tear of the anus may be the cause. If blood drips into the toilet after a bowel movement, bleeding hemorrhoids may be the   problem. The color and frequency of the bleeding are additional considerations. In most cases, the medical history provides clues, but seldom the final answer.  · A visual and finger (digital) exam. Your caregiver will inspect the anal area, looking for tears and hemorrhoids. A finger exam can provide information when there is tenderness or a growth inside. In men, the  prostate is also examined.  · Endoscopy. Several types of small, long scopes (endoscopes) are used to view the colon.  ¨ In the office, your caregiver may use a rigid, or more commonly, a flexible viewing sigmoidoscope. This exam is called flexible sigmoidoscopy. It is performed in 5 to 10 minutes.  ¨ A more thorough exam is accomplished with a colonoscope. It allows your caregiver to view the entire 5 to 6 foot long colon. Medicine to help you relax (sedative) is usually given for this exam. Frequently, a bleeding lesion may be present beyond the reach of the sigmoidoscope. So, a colonoscopy may be the best exam to start with. Both exams are usually done on an outpatient basis. This means the patient does not stay overnight in the hospital or surgery center.  ¨ An upper endoscopy may be needed to examine your stomach. Sedation is used and a flexible endoscope is put in your mouth, down to your stomach.  · A barium enema X-ray. This is an X-ray exam. It uses liquid barium inserted by enema into the rectum. This test alone may not identify an actual bleeding point. X-rays highlight abnormal shadows, such as those made by lumps (tumors), diverticuli, or colitis.  TREATMENT   Treatment depends on the cause of your bleeding.   · For bleeding from the stomach or colon, the caregiver doing your endoscopy or colonoscopy may be able to stop the bleeding as part of the procedure.  · Inflammation or infection of the colon can be treated with medicines.  · Many rectal problems can be treated with creams, suppositories, or warm baths.  · Surgery is sometimes needed.  · Blood transfusions are sometimes needed if you have lost a lot of blood.  · For any bleeding problem, let your caregiver know if you take aspirin or other blood thinners regularly.  HOME CARE INSTRUCTIONS   · Take any medicines exactly as prescribed.  · Keep your stools soft by eating a diet high in fiber. Prunes (1 to 3 a day) work well for many people.  · Drink  enough water and fluids to keep your urine clear or pale yellow.  · Take sitz baths if advised. A sitz bath is when you sit in a bathtub with warm water for 10 to 15 minutes to soak, soothe, and cleanse the rectal area.  · If enemas or suppositories are advised, be sure you know how to use them. Tell your caregiver if you have problems with this.  · Monitor your bowel movements to look for signs of improvement or worsening.  SEEK MEDICAL CARE IF:   · You do not improve in the time expected.  · Your condition worsens after initial improvement.  · You develop any new symptoms.  SEEK IMMEDIATE MEDICAL CARE IF:   · You develop severe or prolonged rectal bleeding.  · You vomit blood.  · You feel weak or faint.  · You have a fever.  MAKE SURE YOU:  · Understand these instructions.  · Will watch your condition.  · Will get help right away if you are not doing well or get worse.    Document Released: 04/15/2002 Document Revised: 07/18/2011 Document Reviewed: 09/10/2010  ExitCare® Patient Information ©2015 [employer]. This information is not intended to replace advice given to you by your health care provider. Make sure you discuss any questions you have with your health care provider.

## 2014-11-20 NOTE — ED Notes (Signed)
Bed: WA12 Expected date:  Expected time:  Means of arrival:  Comments: ems 

## 2014-11-20 NOTE — ED Provider Notes (Signed)
CSN: [id number]     Arrival date & time 11/20/14  1522 History   First MD Initiated Contact with Patient 11/20/14 1530     Chief Complaint  Patient presents with  . Rectal Bleeding     (Consider location/radiation/quality/duration/timing/severity/associated sxs/prior Treatment) HPI Comments: The patient is a 81 year old male, he has a history of anemia, reportedly was at the office at the ophthalmology office getting an eye exam, he had a bowel movement and reportedly had some bright red blood in the stool, this was apparently flecks of bright red blood, the patient has no complaints whatsoever. He denies abdominal pain chest pain shortness of breath fevers chills nausea vomiting diarrhea swelling of the legs or rashes. He does take a baby aspirin, he does not take any other anticoagulative.  Patient is a 81 y.o. male presenting with hematochezia. The history is provided by the patient, the EMS personnel and medical records.  Rectal Bleeding   Past Medical History  Diagnosis Date  . COPD (chronic obstructive pulmonary disease)   . GERD (gastroesophageal reflux disease)   . Prostate cancer     "had prostate removed and had chemo" (02/09/2012)  . Depression   . DVT (deep venous thrombosis)   . Alzheimer's disease   . High cholesterol   . Asthma   . Hypertension   . Pneumonia     "4-5 times thru my life" (02/09/2012)  . Chronic bronchitis     "get it q year" (02/09/2012)  . Exertional dyspnea     "sometimes" (02/09/2012)  . History of blood transfusion     "when prostate removed" (02/09/2012)  . Arthritis     "in my fingers"  (02/09/2012)   Past Surgical History  Procedure Laterality Date  . Tonsillectomy  ~ 1938  . Appendectomy  1950's?  . Cataract extraction w/ intraocular lens  implant, bilateral    . Prostatectomy    . Tear duct probing      "have had it done twice; left eye only?" (02/09/2012)  . Eye surgery     History reviewed. No pertinent family history. History   Substance Use Topics  . Smoking status: Former Smoker -- 1.00 packs/day for 44 years    Types: Cigarettes  . Smokeless tobacco: Never Used     Comment: 02/09/2012 " stopped smoking cigarrette > 30 years ago"  . Alcohol Use: 1.2 oz/week    1 Cans of beer, 1 Shots of liquor per week     Comment: Drink "once in a while"    Review of Systems  Gastrointestinal: Positive for hematochezia.  All other systems reviewed and are negative.     Allergies  Eggs or egg-derived products; Latex; Peanuts; Penicillins; Erythromycin; Fish allergy; Other; Sulfa antibiotics; and Kenalog  Home Medications   Prior to Admission medications   Medication Sig Start Date End Date Taking? Authorizing Provider  Acetaminophen (APAP) 325 MG tablet Take 650 mg by mouth every 6 (six) hours as needed for pain.    Historical Provider, MD  budesonide-formoterol (SYMBICORT) 80-4.5 MCG/ACT inhaler Inhale 2 puffs into the lungs 2 (two) times daily. For COPD    Historical Provider, MD  budesonide-formoterol (SYMBICORT) 80-4.5 MCG/ACT inhaler Inhale 2 puffs into the lungs 2 (two) times daily.    Historical Provider, MD  cholecalciferol (VITAMIN D) 1000 UNITS tablet Take 1,000 Units by mouth daily. For supplement    Historical Provider, MD  galantamine (RAZADYNE) 12 MG tablet Take 12 mg by mouth 2 (two) times daily. For  dementia    Historical Provider, MD  megestrol (MEGACE ES) 625 MG/5ML suspension Take 625 mg by mouth daily.    Historical Provider, MD  Multiple Vitamins-Minerals (CENTRUM SILVER PO) Take 1 tablet by mouth daily.    Historical Provider, MD  multivitamin-lutein [facility]) CAPS Take 1 capsule by mouth daily.    Historical Provider, MD  ondansetron (ZOFRAN) 4 MG tablet Take 4 mg by mouth 3 (three) times daily as needed for nausea or vomiting.     Historical Provider, MD  oxybutynin (DITROPAN) 5 MG tablet Take 5 mg by mouth 2 (two) times daily. For overactive bladder    Historical Provider, MD   pantoprazole (PROTONIX) 40 MG tablet Take 40 mg by mouth daily.    Historical Provider, MD  polyvinyl alcohol (LIQUIFILM TEARS) 1.4 % ophthalmic solution Place 1 drop into both eyes as needed for dry eyes.    Historical Provider, MD  PROVENTIL HFA 108 (90 BASE) MCG/ACT inhaler INHALE 2 PUFFS BY MOUTH EVERY 6 HOURS AS NEEDED Patient taking differently: INHALE 2 PUFFS BY MOUTH EVERY 6 HOURS AS NEEDED for shortness of breath 11/04/13   Gayland Curry, DO  Respiratory Therapy Supplies (FLUTTER) DEVI Blow steadily through, 4 times per set.Repeat three sets daily 08/25/14   Deneise Lever, MD  sertraline (ZOLOFT) 100 MG tablet Take 150 mg by mouth daily. For depression.    Historical Provider, MD  traMADol (ULTRAM) 50 MG tablet Take 1 tablet (50 mg total) by mouth 3 (three) times daily as needed. 08/05/14   Virgel Manifold, MD   BP 145/78 mmHg  Pulse 64  Temp(Src) 97.5 F (36.4 C) (Oral)  Resp 15  SpO2 86% Physical Exam  Constitutional: He appears well-developed and well-nourished. No distress.  HENT:  Head: Normocephalic and atraumatic.  Mouth/Throat: Oropharynx is clear and moist. No oropharyngeal exudate.  Eyes: Conjunctivae and EOM are normal. Pupils are equal, round, and reactive to light. Right eye exhibits no discharge. Left eye exhibits no discharge. No scleral icterus.  Neck: Normal range of motion. Neck supple. No JVD present. No thyromegaly present.  Cardiovascular: Normal rate, regular rhythm, normal heart sounds and intact distal pulses.  Exam reveals no gallop and no friction rub.   No murmur heard. Pulmonary/Chest: Effort normal and breath sounds normal. No respiratory distress. He has no wheezes. He has no rales.  Abdominal: Soft. Bowel sounds are normal. He exhibits no distension and no mass. There is no tenderness.  Soft and nontender abdomen  Genitourinary:  No fissures or hemorrhoids, no masses, large amount of brown stool in the rectal vault, no gross blood  Musculoskeletal:  Normal range of motion. He exhibits no edema or tenderness.  Lymphadenopathy:    He has no cervical adenopathy.  Neurological: He is alert. Coordination normal.  Skin: Skin is warm and dry. No rash noted. No erythema.  Psychiatric: He has a normal mood and affect. His behavior is normal.  Nursing note and vitals reviewed.   ED Course  Procedures (including critical care time) Labs Review Labs Reviewed  CBC WITH DIFFERENTIAL/PLATELET - Abnormal; Notable for the following:    WBC 13.4 (*)    RDW 16.1 (*)    Neutrophils Relative % 78 (*)    Neutro Abs 10.5 (*)    Lymphocytes Relative 11 (*)    Monocytes Absolute 1.2 (*)    All other components within normal limits  I-STAT CHEM 8, ED - Abnormal; Notable for the following:    BUN 38 (*)  Creatinine, Ser 1.60 (*)    All other components within normal limits  PROTIME-INR  APTT  URINALYSIS, ROUTINE W REFLEX MICROSCOPIC (NOT AT [facility])  TYPE AND SCREEN  ABO/RH    Imaging Review No results found.    MDM   Final diagnoses:  Rectal bleeding    Heme occult study is barely positive, I personally performed the study. There was no gross blood in the stool, his vital signs are unremarkable, check a hemoglobin level compared to prior.  Labs neg, stable for d/c.  D/w family - they state that he looks better than he did earlier - has intermittent rectal bleeding due to prior radiation  Noemi Chapel, MD 11/20/14 2354

## 2014-11-20 NOTE — ED Notes (Addendum)
Pt's family requesting [facility] transport home. Pt already given discharge instructions and has signed. Just awaiting transportation to home facility.

## 2014-11-20 NOTE — ED Notes (Addendum)
[facility] presents with a 82 yo male with rectal bleeding.  Pt was seen at [facility] when he was taken to the bathroom by staff members.  Staff member witnessed blood in stool and in brief.  No defined amount given to [facility].  Pt has history of prostate cancer and has been taking radiation (20 years ago) and has had a history of blood in stool.  Alert and Oriented to self with history of dementia.  Pt had dialysis treatment today.  Hx of afib but was in NSR on monitor with pulse rate of 80 and BP of 122/62.  Pt uses walker and wheelchair.  Resident of [facility].

## 2014-12-26 ENCOUNTER — Encounter: Payer: Self-pay | Admitting: Adult Health

## 2014-12-26 ENCOUNTER — Non-Acute Institutional Stay (SKILLED_NURSING_FACILITY): Payer: PPO | Admitting: Adult Health

## 2014-12-26 DIAGNOSIS — F32A Depression, unspecified: Secondary | ICD-10-CM

## 2014-12-26 DIAGNOSIS — M4850XS Collapsed vertebra, not elsewhere classified, site unspecified, sequela of fracture: Secondary | ICD-10-CM

## 2014-12-26 DIAGNOSIS — R630 Anorexia: Secondary | ICD-10-CM

## 2014-12-26 DIAGNOSIS — F0390 Unspecified dementia without behavioral disturbance: Secondary | ICD-10-CM

## 2014-12-26 DIAGNOSIS — F329 Major depressive disorder, single episode, unspecified: Secondary | ICD-10-CM | POA: Diagnosis not present

## 2014-12-26 DIAGNOSIS — E46 Unspecified protein-calorie malnutrition: Secondary | ICD-10-CM

## 2014-12-26 DIAGNOSIS — IMO0001 Reserved for inherently not codable concepts without codable children: Secondary | ICD-10-CM

## 2014-12-26 DIAGNOSIS — J449 Chronic obstructive pulmonary disease, unspecified: Secondary | ICD-10-CM

## 2014-12-26 DIAGNOSIS — K219 Gastro-esophageal reflux disease without esophagitis: Secondary | ICD-10-CM

## 2014-12-26 DIAGNOSIS — N3281 Overactive bladder: Secondary | ICD-10-CM

## 2014-12-26 DIAGNOSIS — E559 Vitamin D deficiency, unspecified: Secondary | ICD-10-CM

## 2014-12-28 NOTE — Progress Notes (Signed)
Patient ID: [name], male   DOB: 1922/12/07, 81 y.o.   MRN: [id number]   12/26/14  Facility:  Nursing Home Location:  [place] Room Number: 1003-2 LEVEL OF CARE:  SNF (31)   Chief Complaint  Patient presents with  . Medical Management of Chronic Issues    Lumbar compression fracture, dementia, COPD, depression, GERD, OAB, anorexia, protein calorie malnutrition and vitamin D deficiency    HISTORY OF PRESENT ILLNESS:  This is an 81 year old male who is being seen for a routine visit. He is a long-term resident of [place]. Latest weight is 115.8 lbs, still underweight. He continues to take Megace and supplementations. Dementia is stable and currently on Razadyne. His COPD is stable. He is having restorative care.  He has PMH of COPD, GERD, prostate cancer, depression, Alzheimer's disease, asthma, hypertension, pneumonia and chronic bronchitis.  PAST MEDICAL HISTORY:  Past Medical History  Diagnosis Date  . COPD (chronic obstructive pulmonary disease)   . GERD (gastroesophageal reflux disease)   . Prostate cancer     "had prostate removed and had chemo" (02/09/2012)  . Depression   . DVT (deep venous thrombosis)   . Alzheimer's disease   . High cholesterol   . Asthma   . Hypertension   . Pneumonia     "4-5 times thru my life" (02/09/2012)  . Chronic bronchitis     "get it q year" (02/09/2012)  . Exertional dyspnea     "sometimes" (02/09/2012)  . History of blood transfusion     "when prostate removed" (02/09/2012)  . Arthritis     "in my fingers"  (02/09/2012)    CURRENT MEDICATIONS: Reviewed per MAR/see medication list    Medication List       This list is accurate as of: 12/26/14 11:59 PM.  Always use your most recent med list.               APAP 325 MG tablet  Take 650 mg by mouth every 6 (six) hours as needed for pain.     budesonide-formoterol 80-4.5 MCG/ACT inhaler  Commonly known as:  SYMBICORT  Inhale 2 puffs into  the lungs 2 (two) times daily. For COPD     cholecalciferol 1000 UNITS tablet  Commonly known as:  VITAMIN D  Take 1,000 Units by mouth daily. For supplement     FLUTTER Evaristo Bury steadily through, 4 times per set.Repeat three sets daily     galantamine 12 MG tablet  Commonly known as:  RAZADYNE  Take 12 mg by mouth 2 (two) times daily. For dementia     megestrol 625 MG/5ML suspension  Commonly known as:  MEGACE ES  Take 625 mg by mouth daily.     multivitamin-lutein Caps capsule  Take 1 capsule by mouth daily.     ondansetron 4 MG tablet  Commonly known as:  ZOFRAN  Take 4 mg by mouth 3 (three) times daily as needed for nausea or vomiting.     oxybutynin 5 MG tablet  Commonly known as:  DITROPAN  Take 5 mg by mouth 2 (two) times daily. For overactive bladder     pantoprazole 40 MG tablet  Commonly known as:  PROTONIX  Take 40 mg by mouth daily.     polyvinyl alcohol 1.4 % ophthalmic solution  Commonly known as:  LIQUIFILM TEARS  Place 1 drop into both eyes as needed for dry eyes.     PROVENTIL HFA 108 (  90 BASE) MCG/ACT inhaler  Generic drug:  albuterol  INHALE 2 PUFFS BY MOUTH EVERY 6 HOURS AS NEEDED     sertraline 100 MG tablet  Commonly known as:  ZOLOFT  Take 150 mg by mouth daily. For depression.     SYSTANE OP  Apply 1 drop to eye 4 (four) times daily. Both eyes     traMADol 50 MG tablet  Commonly known as:  ULTRAM  Take 1 tablet (50 mg total) by mouth 3 (three) times daily as needed.         Allergies  Allergen Reactions  . Eggs Or Egg-Derived Products Nausea And Vomiting and Rash  . Latex Rash  . Peanuts [Peanut Oil] Anaphylaxis and Swelling    "swelling of the face"  . Penicillins Rash    Tolerates ceftriaxone  . Erythromycin     "I don't remember"  . Fish Allergy     "I never eat fish except for halibut"  . Other Other (See Comments)    Lima Beans- Throat swelling   . Sulfa Antibiotics Other (See Comments)    Reaction unknown  .  Kenalog [Triamcinolone Acetonide] Rash     REVIEW OF SYSTEMS:  GENERAL:  no fever, chills  RESPIRATORY: no wheezing, hemoptysis CARDIAC: no chest pain, edema or palpitations GI: no abdominal pain, diarrhea, constipation, heart burn, nausea or vomiting  PHYSICAL EXAMINATION  GENERAL: no acute distress NECK: supple, trachea midline, no neck masses, no thyroid tenderness, no thyromegaly LYMPHATICS: no LAN in the neck, no supraclavicular LAN RESPIRATORY: breathing is even & unlabored, BS CTAB CARDIAC: RRR, no murmur,no extra heart sounds, no edema GI: abdomen soft, normal BS, no masses, no tenderness, no hepatomegaly, no splenomegaly EXTREMITIES: able to move X 4 extremities PSYCHIATRIC: the patient is alert & oriented to person, affect & behavior appropriate  LABS/RADIOLOGY: Labs reviewed: 10/01/14  Sodium 139 potassium 4.8 glucose 79 BUN 31 creatinine 1.38 calcium 8.7 09/23/14  WBC 7.9 hemoglobin 11.1 hematocrit 33.1 MCV 81.7 platelet 124 sodium 141 potassium 4.4 glucose 91 BUN 35 creatinine 1.57 calcium 9.1 09/04/14  WBC 5.6 hemoglobin 12.5 hematocrit 36.6 MCV 92.4 platelet 194 sodium 134 potassium 3.9 glucose 81 BUN 14 creatinine 0.83 total bilirubin 01.0 alkaline phosphatase 58 SGOT 14 SGPT 8 total protein 5.6 albumin 3.1 calcium 8.1 08/14/14  sodium 141 potassium 4.3 glucose 83 BUN 24 creatinine 1.33 calcium 8.4 Basic Metabolic Panel:  Recent Labs  06/20/14 0553 07/30/14 1320 08/05/14 0837 11/20/14 1612  NA 137 139 139 143  K 4.4 4.3 4.3 4.2  CL 104 105 105 108  CO2 24 25 27   --   GLUCOSE 104* 115* 89 80  BUN 18 32* 33* 38*  CREATININE 1.25 1.47* 1.54* 1.60*  CALCIUM 9.0 9.1 9.0  --    Liver Function Tests:  Recent Labs  06/16/14 1428 07/30/14 1320  AST 22 20  ALT 11 15  ALKPHOS 64 52  BILITOT 1.1 1.2  PROT 7.5 7.4  ALBUMIN 4.1 4.3   CBC:  Recent Labs  07/30/14 1320 08/05/14 0837 11/20/14 1558 11/20/14 1612  WBC 10.0 8.8 13.4*  --   NEUTROABS 8.4* 6.7  10.5*  --   HGB 14.2 13.3 13.5 14.3  HCT 42.7 41.0 41.5 42.0  MCV 85.9 85.6 88.3  --   PLT 110* 135* 175  --       ASSESSMENT/PLAN:  Lumbar compression fracture - continue tramadol 50 mg 1 tab by mouth 3 times a day when necessary and Tylenol  650 mg by mouth every 6 hours when necessary for pain  Dementia - continue Razadyne 12 mg 1 tab by mouth twice a day  COPD - stable; continue Symbicort AP-4.5 g/ACT inhaled to puffs by mouth twice a day and Proventil AER HFA inhale 2 puffs by mouth every 6 hours when necessary  Depression - mood is stable; continue Zoloft 150 mg by mouth daily  GERD - continue Protonix 40 mg by mouth daily  OAB - continue oxybutynin 5 mg by mouth daily  Anorexia - continue Megace 625 mg/5 mL by mouth daily; latest weight 115.8 lbs  Protein calorie malnutrition, severe - albumin 3.1; continue supplementation  Vitamin D deficiency - continue vitamin D 3 1000 unit take 1 tab by mouth daily     Goals of care:  Long-term care    [facility] Oter[facility] Lopez, NP [facility] [phone]

## 2015-01-05 NOTE — Patient Outreach (Signed)
[facility] [facility]) Care Management  01/05/2015  [name] 11/14/1922 [id number]   Referral from HTA tier 4 list, assigned to Mariann Laster, [facility] for patient outreach.  [name], [place] Care Management Assistant

## 2015-01-08 ENCOUNTER — Other Ambulatory Visit: Payer: Self-pay

## 2015-01-08 NOTE — Patient Outreach (Signed)
[facility]) Care Management  01/08/2015  SCHNEUR CROWSON [birth year] [id number]  Telephonic Care Management Screening Note  Referral Date:  01/05/15 Referral Source:  HTA tier 4 Referral Issue:  COPD, DM, 1 admission, 2 ED visits.   Outreach call #1.  Patient not reached.  RN CM left HIPAA compliant voice message with name and number.   Plan:  RN CM will schedule for next outreach call within one week.   Mariann Laster, RN, BSN, [facility], CCM  [facility] Management Coordinator [phone] Direct [phone] Cell [phone] Office [phone] Fax

## 2015-01-14 ENCOUNTER — Other Ambulatory Visit: Payer: PPO

## 2015-01-14 NOTE — Patient Outreach (Signed)
[facility] Care Management  01/14/2015  TORREY BALLINAS Jan 06, 1923 [id number]   Telephonic Care Management Screening Note  Referral Date: 01/05/15 Referral Source: HTA tier 4 Referral Issue: COPD, DM, 1 admission, 2 ED visits.   Outreach call #2. Patient not reached.  RN CM left HIPAA compliant voice message with name and number.   Plan:  RN CM will schedule for next outreach call within one week.   Mariann Laster, RN, BSN, [facility], CCM  [facility] Management Coordinator [phone] Direct [phone] Cell [phone] Office [phone] Fax

## 2015-01-16 ENCOUNTER — Non-Acute Institutional Stay (SKILLED_NURSING_FACILITY): Payer: PPO | Admitting: Internal Medicine

## 2015-01-16 ENCOUNTER — Other Ambulatory Visit: Payer: Self-pay

## 2015-01-16 DIAGNOSIS — K219 Gastro-esophageal reflux disease without esophagitis: Secondary | ICD-10-CM | POA: Diagnosis not present

## 2015-01-16 DIAGNOSIS — F0390 Unspecified dementia without behavioral disturbance: Secondary | ICD-10-CM

## 2015-01-16 DIAGNOSIS — E43 Unspecified severe protein-calorie malnutrition: Secondary | ICD-10-CM | POA: Diagnosis not present

## 2015-01-16 DIAGNOSIS — J189 Pneumonia, unspecified organism: Secondary | ICD-10-CM | POA: Diagnosis not present

## 2015-01-16 DIAGNOSIS — N189 Chronic kidney disease, unspecified: Secondary | ICD-10-CM

## 2015-01-16 DIAGNOSIS — IMO0001 Reserved for inherently not codable concepts without codable children: Secondary | ICD-10-CM

## 2015-01-16 DIAGNOSIS — D638 Anemia in other chronic diseases classified elsewhere: Secondary | ICD-10-CM

## 2015-01-16 DIAGNOSIS — M4850XS Collapsed vertebra, not elsewhere classified, site unspecified, sequela of fracture: Secondary | ICD-10-CM

## 2015-01-16 DIAGNOSIS — M4850XA Collapsed vertebra, not elsewhere classified, site unspecified, initial encounter for fracture: Secondary | ICD-10-CM | POA: Insufficient documentation

## 2015-01-16 DIAGNOSIS — J181 Lobar pneumonia, unspecified organism: Principal | ICD-10-CM

## 2015-01-16 NOTE — Patient Outreach (Signed)
[facility]) Care Management  01/16/2015  SEAN MALINOWSKI [birth year] [id number]   Telephonic Care Management Screening Note  Referral Date: 01/05/15 Referral Source: HTA tier 4 Referral Issue: COPD, DM, 1 admission, 2 ED visits.   Outreach call #3. Patient not reached.  RN CM left HIPAA compliant voice message with name and number.   Plan:  RN CM will send unsuccessful outreach letter to patient.  RN CM will review case in 2 weeks and close case if no response received back from patient.  Mariann Laster, RN, BSN, [facility], CCM  [facility] Management Coordinator [phone] Direct [phone] Cell [phone] Office [phone] Fax

## 2015-01-16 NOTE — Progress Notes (Signed)
Patient ID: [name], male   DOB: Mar 09, 1923, 81 y.o.   MRN: [id number]     [facility]   PCP: FRIED, Jaymes Graff, MD  Code Status: full code  Allergies  Allergen Reactions  . Eggs Or Egg-Derived Products Nausea And Vomiting and Rash  . Latex Rash  . Peanuts [Peanut Oil] Anaphylaxis and Swelling    "swelling of the face"  . Penicillins Rash    Tolerates ceftriaxone  . Erythromycin     "I don't remember"  . Fish Allergy     "I never eat fish except for halibut"  . Other Other (See Comments)    Lima Beans- Throat swelling   . Sulfa Antibiotics Other (See Comments)    Reaction unknown  . Kenalog [Triamcinolone Acetonide] Rash    Chief Complaint  Patient presents with  . Medical Management of Chronic Issues     HPI:  81 year old patient is seen for routine visit. He is a long term care resident. He has past medical history of COPD, GERD, prostate cancer, depression, DVT, Alzheimer's, HLD, HTN compression frcature. He is seen in his room today. He is on his wheelchair, in no distress. He denies any concerns. He is currently on levaquin for for left upper lobe pneumonia. His dementia limits his history taking. On review of chart, has lost 5 lbs over last month  Review of Systems:  Constitutional: Negative for fever, chills.  HENT: Negative for headache, congestion.  Eyes: Negative for eye pain, blurred vision, double vision and discharge. has glasses Respiratory: Negative for shortness of breath and wheezing.    Cardiovascular: Negative for chest pain, palpitations, leg swelling.  Gastrointestinal: Negative for heartburn, nausea, vomiting, abdominal pain Genitourinary: Negative for dysuria Musculoskeletal: Negative for back pain, falls in facility but is a high fall risk Skin: Negative for itching, rash.  Neurological: Negative for dizziness, tingling, focal weakness Psychiatric/Behavioral: positive for history of memory loss and  depression.    Past Medical History  Diagnosis Date  . COPD (chronic obstructive pulmonary disease)   . GERD (gastroesophageal reflux disease)   . Prostate cancer     "had prostate removed and had chemo" (02/09/2012)  . Depression   . DVT (deep venous thrombosis)   . Alzheimer's disease   . High cholesterol   . Asthma   . Hypertension   . Pneumonia     "4-5 times thru my life" (02/09/2012)  . Chronic bronchitis     "get it q year" (02/09/2012)  . Exertional dyspnea     "sometimes" (02/09/2012)  . History of blood transfusion     "when prostate removed" (02/09/2012)  . Arthritis     "in my fingers"  (02/09/2012)   Past Surgical History  Procedure Laterality Date  . Tonsillectomy  ~ 1938  . Appendectomy  1950's?  . Cataract extraction w/ intraocular lens  implant, bilateral    . Prostatectomy    . Tear duct probing      "have had it done twice; left eye only?" (02/09/2012)  . Eye surgery     Social History:   reports that he has quit smoking. His smoking use included Cigarettes. He has a 44 pack-year smoking history. He has never used smokeless tobacco. He reports that he drinks about 1.2 oz of alcohol per week. He reports that he does not use illicit drugs.  No family history on file.  Medications: Patient's Medications  New Prescriptions   No medications  on file  Previous Medications   ACETAMINOPHEN (APAP) 325 MG TABLET    Take 650 mg by mouth every 6 (six) hours as needed for pain.   BUDESONIDE-FORMOTEROL (SYMBICORT) 80-4.5 MCG/ACT INHALER    Inhale 2 puffs into the lungs 2 (two) times daily. For COPD   CHOLECALCIFEROL (VITAMIN D) 1000 UNITS TABLET    Take 1,000 Units by mouth daily. For supplement   GALANTAMINE (RAZADYNE) 12 MG TABLET    Take 12 mg by mouth 2 (two) times daily. For dementia   LEVOFLOXACIN (LEVAQUIN) 500 MG TABLET    Take 500 mg by mouth daily.   MEGESTROL (MEGACE ES) 625 MG/5ML SUSPENSION    Take 625 mg by mouth daily.   MULTIVITAMIN-LUTEIN  (OCUVITE-LUTEIN) CAPS    Take 1 capsule by mouth daily.   ONDANSETRON (ZOFRAN) 4 MG TABLET    Take 4 mg by mouth 3 (three) times daily as needed for nausea or vomiting.    OXYBUTYNIN (DITROPAN) 5 MG TABLET    Take 5 mg by mouth 2 (two) times daily. For overactive bladder   PANTOPRAZOLE (PROTONIX) 40 MG TABLET    Take 40 mg by mouth daily.   POLYETHYL GLYCOL-PROPYL GLYCOL (SYSTANE OP)    Apply 1 drop to eye 4 (four) times daily. Both eyes   POLYVINYL ALCOHOL (LIQUIFILM TEARS) 1.4 % OPHTHALMIC SOLUTION    Place 1 drop into both eyes as needed for dry eyes.   PROVENTIL HFA 108 (90 BASE) MCG/ACT INHALER    INHALE 2 PUFFS BY MOUTH EVERY 6 HOURS AS NEEDED   RESPIRATORY THERAPY SUPPLIES (FLUTTER) DEVI    Blow steadily through, 4 times per set.Repeat three sets daily   SACCHAROMYCES BOULARDII (FLORASTOR) 250 MG CAPSULE    Take 250 mg by mouth 2 (two) times daily.   SERTRALINE (ZOLOFT) 100 MG TABLET    Take 150 mg by mouth daily. For depression.   TRAMADOL (ULTRAM) 50 MG TABLET    Take 1 tablet (50 mg total) by mouth 3 (three) times daily as needed.  Modified Medications   No medications on file  Discontinued Medications   No medications on file     Physical Exam: Filed Vitals:   01/16/15 1544  BP: 123/66  Pulse: 69  Temp: 97.5 F (36.4 C)  Resp: 18  Height: 5\' 8"  (1.727 m)  Weight: 113 lb (51.256 kg)  SpO2: 94%   General- elderly male, thin built, frail, in no acute distress Head- atraumatic, normocephalic Neck- no lymphadenopathy Cardiovascular- normal s1,s2, no murmurs, no leg edema Respiratory- bilateral clear to auscultation, no wheeze, no rhonchi, no crackles Abdomen- bowel sounds present, soft, non tender Musculoskeletal- able to move all 4 extremities, generalized weakness Neurological- no focal deficit Psychiatry- alert and oriented to person   Labs reviewed: Basic Metabolic Panel:  Recent Labs  06/20/14 0553 07/30/14 1320 08/05/14 0837 11/20/14 1612  NA 137 139 139  143  K 4.4 4.3 4.3 4.2  CL 104 105 105 108  CO2 24 25 27   --   GLUCOSE 104* 115* 89 80  BUN 18 32* 33* 38*  CREATININE 1.25 1.47* 1.54* 1.60*  CALCIUM 9.0 9.1 9.0  --    Liver Function Tests:  Recent Labs  06/16/14 1428 07/30/14 1320  AST 22 20  ALT 11 15  ALKPHOS 64 52  BILITOT 1.1 1.2  PROT 7.5 7.4  ALBUMIN 4.1 4.3   No results for input(s): LIPASE, AMYLASE in the last 8760 hours. No results for input(s):  AMMONIA in the last 8760 hours. CBC:  Recent Labs  07/30/14 1320 08/05/14 0837 11/20/14 1558 11/20/14 1612  WBC 10.0 8.8 13.4*  --   NEUTROABS 8.4* 6.7 10.5*  --   HGB 14.2 13.3 13.5 14.3  HCT 42.7 41.0 41.5 42.0  MCV 85.9 85.6 88.3  --   PLT 110* 135* 175  --      Assessment/Plan  Left lower lobe pneumonia Clinically improved, afebrile. Monitor clinically. Continue and complete course of levaquin with florastor.   Protein calorie malnutrition Lost 5 lbs over a month. Decline anticipated with his ongoing dementia. Continue fortified meals, assistance with  meals, magic cup supplement. On megace for appetite stimulation without much help.   Vertebral compression fracture No recent fall reported. Continue vitamind  1000 u daily. Currently on tramadol 50 mg tid prn and tylenol 650 mg q6h  prn pain. Has not required it. D/c tramadol and continue prn tylenol  Dementia Persists, decline anticipated, continue razadyne, MVI. Monitor weight. Continue assistance with ADLs and pressure ulcer prophylaxis  gerd Stable, decrease protonix to 20 mg daily and monitor  Anemia of chronic disease Reviewed lab from 7/16.  chronic renal failure Avoid NSAIDs.    Blanchie Serve, MD  [facility] [phone] (Monday-Friday 8 am - 5 pm) [phone] (afterhours)

## 2015-01-19 ENCOUNTER — Other Ambulatory Visit: Payer: Self-pay

## 2015-01-19 NOTE — Patient Outreach (Signed)
[facility]) Care Management  01/19/2015  GURKIRAT BASHER [birth year] [id number]   Telephonic Screen Note  Outreach call to wife following inbound voice message from wife requesting call back to [phone] cell.  Not reached and unable to leave voice message.    Plan Rescheduled for next attempted call within one week.   Mariann Laster, RN, BSN, [facility], CCM  [facility] [employer] Management Coordinator [phone] Direct [phone] Cell [phone] Office [phone] Fax

## 2015-01-22 ENCOUNTER — Other Ambulatory Visit: Payer: Self-pay

## 2015-01-22 NOTE — Patient Outreach (Signed)
[place] [facility]) Care Management  01/22/2015  [name] Jul 06, 1922 [id number]   Notification received from [place] to close case due to patient being assessed / no further intervention needed. Patient is in a SNF.  Kennet Mccort L. Shoua Ulloa, [place] Care Management Assistant

## 2015-01-22 NOTE — Patient Outreach (Signed)
[facility] [facility]) Care Management  01/22/2015  CARDARIUS SENAT November 14, 1922 [id number]   Inbound voice message received from wife following unsuccessful outreach letter sent on 01/16/15.  Wife states patient resides in SNF/Rehab [facility] and she is not sure if patient will return home.  RN CM advised that should patient discharge to home and needs arise; Primary care MD, Dr. Delfina Redwood can make a new referral to [facility].    RN CM notified [place] Assistant to close case.  RN CM notified Dr. Delfina Redwood via case closure letter.    Mariann Laster, RN, BSN, [facility], CCM  [facility] [employer] Management Coordinator [phone] Direct [phone] Cell [phone] Office [phone] Fax

## 2015-01-26 ENCOUNTER — Ambulatory Visit: Payer: PPO | Admitting: Internal Medicine

## 2015-02-23 ENCOUNTER — Encounter: Payer: Self-pay | Admitting: Internal Medicine

## 2015-02-23 ENCOUNTER — Non-Acute Institutional Stay (SKILLED_NURSING_FACILITY): Payer: Medicare Other | Admitting: Internal Medicine

## 2015-02-23 DIAGNOSIS — F329 Major depressive disorder, single episode, unspecified: Secondary | ICD-10-CM

## 2015-02-23 DIAGNOSIS — N3281 Overactive bladder: Secondary | ICD-10-CM

## 2015-02-23 DIAGNOSIS — F0280 Dementia in other diseases classified elsewhere without behavioral disturbance: Secondary | ICD-10-CM | POA: Diagnosis not present

## 2015-02-23 DIAGNOSIS — G309 Alzheimer's disease, unspecified: Secondary | ICD-10-CM

## 2015-02-23 DIAGNOSIS — E43 Unspecified severe protein-calorie malnutrition: Secondary | ICD-10-CM | POA: Diagnosis not present

## 2015-02-23 DIAGNOSIS — F0150 Vascular dementia without behavioral disturbance: Secondary | ICD-10-CM

## 2015-02-23 NOTE — Progress Notes (Signed)
Patient ID: [name], male   DOB: 1922/09/23, 81 y.o.   MRN: [id number]     [facility]   PCP: Kandice Hams, MD  Code Status: full code  Allergies  Allergen Reactions  . Eggs Or Egg-Derived Products Nausea And Vomiting and Rash  . Latex Rash  . Peanuts [Peanut Oil] Anaphylaxis and Swelling    "swelling of the face"  . Penicillins Rash    Tolerates ceftriaxone  . Erythromycin     "I don't remember"  . Fish Allergy     "I never eat fish except for halibut"  . Other Other (See Comments)    Lima Beans- Throat swelling   . Sulfa Antibiotics Other (See Comments)    Reaction unknown  . Kenalog [Triamcinolone Acetonide] Rash    Chief Complaint  Patient presents with  . Medical Management of Chronic Issues    Routine Visit      HPI:  81 year old patient is seen for routine visit. He has been at his baseline per staff with no new concerns. He has past medical history of COPD, GERD, prostate cancer, depression, DVT, Alzheimer's, HLD, HTN compression frcature. He wheels himself around on wheelchair. His wife is present during the visit. He denies any concerns. No falls reported. No new skin concerns. Has history of eczema.   Review of Systems:  Constitutional: Negative for fever, chills.  HENT: Negative for headache, congestion.  Eyes: Negative for eye pain, blurred vision, double vision and discharge. has glasses Respiratory: Negative for shortness of breath and wheezing.    Cardiovascular: Negative for chest pain, palpitations, leg swelling.  Gastrointestinal: Negative for heartburn, nausea, vomiting, abdominal pain Genitourinary: Negative for dysuria Musculoskeletal: Negative for back pain, falls in facility but is a high fall risk Skin: has eczema Neurological: Negative for dizziness, tingling, focal weakness Psychiatric/Behavioral: positive for history of memory loss and depression.    Past Medical History  Diagnosis Date  .  COPD (chronic obstructive pulmonary disease) ([place])   . GERD (gastroesophageal reflux disease)   . Prostate cancer [facility])     "had prostate removed and had chemo" (02/09/2012)  . Depression   . DVT (deep venous thrombosis) ([place])   . Alzheimer's disease   . High cholesterol   . Asthma   . Hypertension   . Pneumonia     "4-5 times thru my life" (02/09/2012)  . Chronic bronchitis ([place])     "get it q year" (02/09/2012)  . Exertional dyspnea     "sometimes" (02/09/2012)  . History of blood transfusion     "when prostate removed" (02/09/2012)  . Arthritis     "in my fingers"  (02/09/2012)   Past Surgical History  Procedure Laterality Date  . Tonsillectomy  ~ 1938  . Appendectomy  1950's?  . Cataract extraction w/ intraocular lens  implant, bilateral    . Prostatectomy    . Tear duct probing      "have had it done twice; left eye only?" (02/09/2012)  . Eye surgery     Social History:   reports that he has quit smoking. His smoking use included Cigarettes. He has a 44 pack-year smoking history. He has never used smokeless tobacco. He reports that he drinks about 1.2 oz of alcohol per week. He reports that he does not use illicit drugs.  History reviewed. No pertinent family history.  Medications: Patient's Medications  New Prescriptions   No medications on file  Previous Medications  ACETAMINOPHEN (APAP) 325 MG TABLET    Take 650 mg by mouth every 6 (six) hours as needed for pain.   BUDESONIDE-FORMOTEROL (SYMBICORT) 80-4.5 MCG/ACT INHALER    Inhale 2 puffs into the lungs 2 (two) times daily. For COPD   CHOLECALCIFEROL (VITAMIN D) 1000 UNITS TABLET    Take 1,000 Units by mouth daily. For supplement   GALANTAMINE (RAZADYNE) 12 MG TABLET    Take 12 mg by mouth 2 (two) times daily. For dementia   MEGESTROL (MEGACE ES) 625 MG/5ML SUSPENSION    Take 625 mg by mouth daily.   MULTIVITAMIN-LUTEIN (OCUVITE-LUTEIN) CAPS    Take 1 capsule by mouth daily.   ONDANSETRON (ZOFRAN) 4 MG TABLET    Take  4 mg by mouth as needed for nausea or vomiting.    OXYBUTYNIN (DITROPAN) 5 MG TABLET    Take 5 mg by mouth daily. For overactive bladder   PANTOPRAZOLE (PROTONIX) 20 MG TABLET    Take 20 mg by mouth daily.   POLYETHYL GLYCOL-PROPYL GLYCOL (SYSTANE OP)    Apply 1 drop to eye 4 (four) times daily. Both eyes   POLYVINYL ALCOHOL (LIQUIFILM TEARS) 1.4 % OPHTHALMIC SOLUTION    Place 1 drop into both eyes as needed for dry eyes.   PROVENTIL HFA 108 (90 BASE) MCG/ACT INHALER    INHALE 2 PUFFS BY MOUTH EVERY 6 HOURS AS NEEDED   SERTRALINE (ZOLOFT) 100 MG TABLET    Take 150 mg by mouth daily. For depression.  Modified Medications   No medications on file  Discontinued Medications   LEVOFLOXACIN (LEVAQUIN) 500 MG TABLET    Take 500 mg by mouth daily.   PANTOPRAZOLE (PROTONIX) 40 MG TABLET    Take 40 mg by mouth daily.   RESPIRATORY THERAPY SUPPLIES (FLUTTER) DEVI    Blow steadily through, 4 times per set.Repeat three sets daily   SACCHAROMYCES BOULARDII (FLORASTOR) 250 MG CAPSULE    Take 250 mg by mouth 2 (two) times daily.   TRAMADOL (ULTRAM) 50 MG TABLET    Take 1 tablet (50 mg total) by mouth 3 (three) times daily as needed.     Physical Exam: Filed Vitals:   02/23/15 1604  BP: 122/73  Pulse: 82  Temp: 98.6 F (37 C)  TempSrc: Oral  Resp: 16  Weight: 113 lb 12.8 oz (51.619 kg)  SpO2: 92%   General- elderly male, thin built, frail, in no acute distress Head- atraumatic, normocephalic Neck- no lymphadenopathy Cardiovascular- normal s1,s2, no murmurs, no leg edema Respiratory- bilateral clear to auscultation, no wheeze, no rhonchi, no crackles Abdomen- bowel sounds present, soft, non tender Musculoskeletal- able to move all 4 extremities, generalized weakness, muscle atrophy, on wheelchair Neurological- no focal deficit Psychiatry- alert and oriented to person Skin- eczema rash on face and arms, no skin breakdown   Labs reviewed: Basic Metabolic Panel:  Recent Labs  06/20/14 0553  07/30/14 1320 08/05/14 0837 10/01/14 11/20/14 1612  NA 137 139 139 139 143  K 4.4 4.3 4.3 4.8 4.2  CL 104 105 105  --  108  CO2 24 25 27   --   --   GLUCOSE 104* 115* 89  --  80  BUN 18 32* 33* 31* 38*  CREATININE 1.25 1.47* 1.54* 1.4* 1.60*  CALCIUM 9.0 9.1 9.0  --   --    Liver Function Tests:  Recent Labs  06/16/14 1428 07/30/14 1320  AST 22 20  ALT 11 15  ALKPHOS 64 52  BILITOT 1.1  1.2  PROT 7.5 7.4  ALBUMIN 4.1 4.3   No results for input(s): LIPASE, AMYLASE in the last 8760 hours. No results for input(s): AMMONIA in the last 8760 hours. CBC:  Recent Labs  07/30/14 1320 08/05/14 0837 11/20/14 1558 11/20/14 1612  WBC 10.0 8.8 13.4*  --   NEUTROABS 8.4* 6.7 10.5*  --   HGB 14.2 13.3 13.5 14.3  HCT 42.7 41.0 41.5 42.0  MCV 85.9 85.6 88.3  --   PLT 110* 135* 175  --      Assessment/Plan   Chronic depression Stable, continue zoloft but attempt GDR from 150 mg daily since march 2016 to 125 mg daily for now and reassess  Protein calorie malnutrition  Wt Readings from Last 3 Encounters:  02/23/15 113 lb 12.8 oz (51.619 kg)  01/16/15 113 lb (51.256 kg)  12/26/14 115 lb 12.8 oz (52.527 kg)   Continues to lose weight. With her dementia, decline anticipated. Continue fortified meals, assistance with  meals, magic cup supplement. Continue megace for appetite stimulation. Continue MVI. If no weight improvement  noted on next month follow up, will discontinue megace  OAB Continue ditropan 5 mg daily and monitor  Dementia  Persists, decline anticipated, continue razadyne, MVI. Monitor weight. Continue assistance with ADLs and pressure ulcer prophylaxis   Blanchie Serve, MD  [facility] [phone] (Monday-Friday 8 am - 5 pm) [phone] (afterhours)

## 2015-03-23 ENCOUNTER — Ambulatory Visit (INDEPENDENT_AMBULATORY_CARE_PROVIDER_SITE_OTHER): Payer: Medicare Other | Admitting: Internal Medicine

## 2015-03-23 ENCOUNTER — Encounter: Payer: Self-pay | Admitting: Internal Medicine

## 2015-03-23 VITALS — BP 120/66 | HR 74 | Ht 68.0 in | Wt 104.8 lb

## 2015-03-23 DIAGNOSIS — J479 Bronchiectasis, uncomplicated: Secondary | ICD-10-CM

## 2015-03-23 DIAGNOSIS — J449 Chronic obstructive pulmonary disease, unspecified: Secondary | ICD-10-CM | POA: Diagnosis not present

## 2015-03-23 MED ORDER — METHYLPREDNISOLONE ACETATE 80 MG/ML IJ SUSP
80.0000 mg | Freq: Once | INTRAMUSCULAR | Status: AC
  Administered 2015-03-23: 80 mg via INTRAMUSCULAR

## 2015-03-23 MED ORDER — LEVALBUTEROL HCL 0.63 MG/3ML IN NEBU
0.6300 mg | INHALATION_SOLUTION | Freq: Once | RESPIRATORY_TRACT | Status: AC
  Administered 2015-03-23: 0.63 mg via RESPIRATORY_TRACT

## 2015-03-23 NOTE — Patient Instructions (Addendum)
Neb xop 0.63   Dx acute on chronic bronchitis   Depo 80

## 2015-03-23 NOTE — Progress Notes (Signed)
14 yoM former smoker refered Per Dr Velvet Bathe from hospital side-pt has had PNA x 2 in 2016. Currently resides at [facility]   Wife here  Known COPD ( 1 ppd x 44 years).GERD, prostate Ca, depression, hx DVT, Alzheimers. Concerned about pneumonia twice this winter and several times in past years. Aware of aspiration- on mechanical soft diet, thin liquids. Head of bed up.Cough "some days" with white or light brown sputum. No fever/ sweats. Denies waking choking. Always raspy breathing. O2 sat per wife usually around 94% on RA. Denies heart disease. No home O2.  CT chest 06/19/14-   I reviewed images IMPRESSION: 1. Progressive cystic bronchiectasis involving the right lower lobe with associated worsening interstitial consolidative airspace opacities - favored to represent the progression of chronic atypical infection (such as MAC), though ongoing aspiration could have a similar appearance. Clinical correlation is advised. 2. Nodules within the bilateral upper lobes are unchanged since the 2012 examination and thus of benign etiology, likely the sequela of prior infection and/or inflammation. 3. Incidentally noted cholelithiasis without evidence of cholecystitis. 4. Unchanged moderate (approximately 40%) compression deformity involving the superior endplate of the 624THL vertebral body, similar to prior chest radiograph performed 06/2013. Signed  By: Sandi Mariscal M.D.  On: 06/19/2014 22:05 Ba Swallow 06/20/14- negative for aspiration CXR 07/30/14 IMPRESSION: COPD changes with bibasilar atelectasis. No acute abnormalities. Electronically Signed  By: Lavonia Dana M.D.  On: 07/30/2014 14:21  09/24/14- 69 yoM followed for recurrent pneumonia, bronchiectasis, suspected atypical infection, GERD with recurrent aspiration DX LAST W[facility]. [facility] [place]. NO CHANGE PT. POSSIBLY WORSE     wife here Pt incontinent of stool on arrival here- staff cleaned him up.  Hypoxic on exertion on  arrival and placed on O2.. With rest, room air saturation improved into the 90% range where his wife says it usually is at [facility]. Chest x-ray report from May 9 had shown patchy minimal right side pneumonia and his supervising physician at his nursing home treated with Levaquin 7 days. CXR 09/24/14- images reviewed-prominent markings and reticular nodular densities that may be scarring. IMPRESSION: There is no definite evidence of aspiration pneumonia. There are findings consistent with COPD with mild pulmonary interstitial prominence greatest on the right. There is no CHF. Electronically Signed  By: David Martinique M.D.  On: 09/24/2014 15:07 Sputum cultures from last visit so far are growing only normal flora  03/23/15- 74 yoM followed for recurrent pneumonia, bronchiectasis, suspected atypical infection, GERD with recurrent aspiration FOLLOWS FOR: Wife states patient is put on O2 when patient has hard time breathing; Pt resides at [facility]. PNA about 1.5 month ago-was treated by MD at [facility]. Pt has lost about 12# in about 2 weeks(give or take), weak as well. Sputum cultures 08/25/2014-normal flora, negative AFB cx, candida. Does not accept flu vaccine because of egg allergy. Educated on presence of egg-free flu vaccine but actual source unclear  ROS-see HPI   Negative unless "+" Constitutional:    +weight loss, night sweats, fevers, chills, fatigue, lassitude. HEENT:    headaches, +difficulty swallowing, tooth/dental problems, sore throat,       sneezing, itching, ear ache, nasal congestion, post nasal drip, snoring CV:    chest pain, orthopnea, PND, swelling in lower extremities, anasarca,  dizziness, palpitations Resp:   +shortness of breath with exertion or at rest.                +productive cough,   non-productive cough, coughing up of blood.              change in color of mucus.  wheezing.   Skin:    rash or  lesions. GI:  No-   heartburn, indigestion, abdominal pain, nausea, vomiting,  GU: MS:   joint pain, stiffness,  Neuro-     nothing unusual Psych:  change in mood or affect.  +depression or anxiety.   memory loss.  OBJ- Physical Exam   + very frail elderly man looking chronically ill but more alert and interactive than last visit General- Alert, Oriented, Affect-appropriate, Distress- none acute, +wheelchair Skin- + scaling Lymphadenopathy- none Head- atraumatic            Eyes- Gross vision intact, PERRLA, conjunctivae and secretions clear,                        + bilateral ectropion            Ears- +Very hard of hearing/ without hearing aid            Nose- Clear, no-Septal dev, mucus, polyps, erosion, perforation             Throat- Mallampati II , mucosa clear , drainage- none, tonsils- atrophic,  + dentures Neck- flexible , trachea midline, no stridor , thyroid nl, carotid no bruit Chest - symmetrical excursion , unlabored           Heart/CV- RRR , no murmur , no gallop  , no rub, nl s1 s2                           - JVD- none , edema- none, stasis changes- none, varices- none           Lung- +coarse rhonchi R base, wheeze- none, cough+raspy , dullness-none, rub- none           Chest wall-  Abd-  Br/ Gen/ Rectal- Not done, not indicated Extrem- cyanosis- none, clubbing, none, atrophy- none, strength- nl Neuro- grossly intact to observation

## 2015-03-23 NOTE — Assessment & Plan Note (Signed)
Chronic cough with negative cultures in April being consistent with presumed etiology-recurrent aspiration. He is now probably at his baseline. Plan-nebulizer treatments Xopenex, Depo-Medrol

## 2015-03-23 NOTE — Assessment & Plan Note (Addendum)
Significant chronic bronchitis component without acute exacerbation. Room air saturation 94% on this visit Plan-medications discussed

## 2015-05-19 LAB — BASIC METABOLIC PANEL
BUN: 38 mg/dL — AB (ref 4–21)
Creatinine: 1.7 mg/dL — AB (ref 0.6–1.3)
Glucose: 126 mg/dL
POTASSIUM: 4.9 mmol/L (ref 3.4–5.3)
SODIUM: 144 mmol/L (ref 137–147)

## 2015-05-19 LAB — CBC AND DIFFERENTIAL
HCT: 36 % — AB (ref 41–53)
Hemoglobin: 12.4 g/dL — AB (ref 13.5–17.5)
Platelets: 168 10*3/uL (ref 150–399)
WBC: 13.4 10^3/mL

## 2015-05-19 LAB — HEPATIC FUNCTION PANEL
ALT: 6 U/L — AB (ref 10–40)
AST: 10 U/L — AB (ref 14–40)
Alkaline Phosphatase: 51 U/L (ref 25–125)

## 2015-06-08 ENCOUNTER — Encounter: Payer: Self-pay | Admitting: Internal Medicine

## 2015-06-08 ENCOUNTER — Non-Acute Institutional Stay (SKILLED_NURSING_FACILITY): Payer: Medicare Other | Admitting: Internal Medicine

## 2015-06-08 DIAGNOSIS — I10 Essential (primary) hypertension: Secondary | ICD-10-CM | POA: Diagnosis not present

## 2015-06-08 DIAGNOSIS — K219 Gastro-esophageal reflux disease without esophagitis: Secondary | ICD-10-CM

## 2015-06-08 DIAGNOSIS — J449 Chronic obstructive pulmonary disease, unspecified: Secondary | ICD-10-CM | POA: Diagnosis not present

## 2015-06-08 NOTE — Progress Notes (Signed)
Patient ID: [name], male   DOB: Mar 08, 1923, 82 y.o.   MRN: [id number]     [facility]   PCP: Kandice Hams, MD  Code Status: DNR  Allergies  Allergen Reactions  . Eggs Or Egg-Derived Products Nausea And Vomiting and Rash  . Latex Rash  . Peanuts [Peanut Oil] Anaphylaxis and Swelling    "swelling of the face"  . Penicillins Rash    Tolerates ceftriaxone  . Codeine   . Erythromycin     "I don't remember"  . Fish Allergy     "I never eat fish except for halibut"  . Other Other (See Comments)    Lima Beans- Throat swelling   . Sulfa Antibiotics Other (See Comments)    Reaction unknown  . Kenalog [Triamcinolone Acetonide] Rash    Chief Complaint  Patient presents with  . Medical Management of Chronic Issues    Routine Visit      HPI:  82 year old patient is seen for routine visit. He has past medical history of COPD, GERD, prostate cancer, depression, DVT, Alzheimer's, HLD, HTN compression frcature. He wheels himself around on wheelchair. He denies any concerns. No falls reported. No new skin concerns   Review of Systems:  Constitutional: Negative for fever, chills.  HENT: Negative for headache, congestion.  Eyes: Negative for eye pain, blurred vision, double vision and discharge. has glasses Respiratory: Negative for shortness of breath and wheezing.    Cardiovascular: Negative for chest pain, palpitations, leg swelling.  Gastrointestinal: Negative for heartburn, nausea, vomiting, abdominal pain Genitourinary: Negative for dysuria Musculoskeletal: Negative for back pain, falls in facility but is a high fall risk Skin: has eczema Neurological: Negative for dizziness, tingling, focal weakness Psychiatric/Behavioral: positive for history of memory loss and depression.    Past Medical History  Diagnosis Date  . COPD (chronic obstructive pulmonary disease) ([place])   . GERD (gastroesophageal reflux disease)   . Prostate cancer  [facility])     "had prostate removed and had chemo" (02/09/2012)  . Depression   . DVT (deep venous thrombosis) ([place])   . Alzheimer's disease   . High cholesterol   . Asthma   . Hypertension   . Pneumonia     "4-5 times thru my life" (02/09/2012)  . Chronic bronchitis ([place])     "get it q year" (02/09/2012)  . Exertional dyspnea     "sometimes" (02/09/2012)  . History of blood transfusion     "when prostate removed" (02/09/2012)  . Arthritis     "in my fingers"  (02/09/2012)   Medications: Patient's Medications  New Prescriptions   No medications on file  Previous Medications   ACETAMINOPHEN (APAP) 325 MG TABLET    Take 650 mg by mouth every 6 (six) hours as needed for pain.   BUDESONIDE-FORMOTEROL (SYMBICORT) 80-4.5 MCG/ACT INHALER    Inhale 2 puffs into the lungs 2 (two) times daily. For COPD   CHOLECALCIFEROL (VITAMIN D) 1000 UNITS TABLET    Take 1,000 Units by mouth daily. For supplement   CLONAZEPAM (KLONOPIN) 0.5 MG TABLET    Take 1/2 tablet by mouth BID prn   GALANTAMINE (RAZADYNE) 12 MG TABLET    Take 12 mg by mouth 2 (two) times daily. For dementia   IPRATROPIUM-ALBUTEROL (DUONEB) 0.5-2.5 (3) MG/3ML SOLN    Take 3 mLs by nebulization every 6 (six) hours as needed (for wheezing).   MEGESTROL (MEGACE ES) 625 MG/5ML SUSPENSION    Take 625 mg  by mouth daily.   MULTIVITAMIN-LUTEIN (OCUVITE-LUTEIN) CAPS    Take 1 capsule by mouth daily.   ONDANSETRON (ZOFRAN) 4 MG TABLET    Take 4 mg by mouth as needed for nausea or vomiting.    OXYBUTYNIN (DITROPAN) 5 MG TABLET    Take 5 mg by mouth daily. For overactive bladder   PANTOPRAZOLE (PROTONIX) 20 MG TABLET    Take 20 mg by mouth daily.   POLYETHYL GLYCOL-PROPYL GLYCOL (SYSTANE OP)    Apply 1 drop to eye 4 (four) times daily. Both eyes   POLYVINYL ALCOHOL (LIQUIFILM TEARS) 1.4 % OPHTHALMIC SOLUTION    Place 1 drop into both eyes 4 (four) times daily as needed for dry eyes.    PROVENTIL HFA 108 (90 BASE) MCG/ACT INHALER    INHALE 2 PUFFS BY  MOUTH EVERY 6 HOURS AS NEEDED   SERTRALINE (ZOLOFT) 100 MG TABLET    Take 100 mg by mouth daily. For depression.   SERTRALINE (ZOLOFT) 25 MG TABLET    Take 1 tablet with Zoloft 100mg  daily  Modified Medications   No medications on file  Discontinued Medications   No medications on file     Physical Exam: Filed Vitals:   06/08/15 1558  BP: 164/72  Pulse: 67  Temp: 97.4 F (36.3 C)  TempSrc: Oral  Resp: 14  Height: 5\' 8"  (1.727 m)  Weight: 112 lb (50.803 kg)  SpO2: 99%   General- elderly male, thin built, frail, in no acute distress Head- atraumatic, normocephalic Neck- no lymphadenopathy Cardiovascular- normal s1,s2, no murmurs, no leg edema Respiratory- bilateral clear to auscultation, no wheeze, no rhonchi, no crackles Abdomen- bowel sounds present, soft, non tender Musculoskeletal- able to move all 4 extremities, generalized weakness, muscle atrophy, on wheelchair Neurological- no focal deficit Psychiatry- alert and oriented to person Skin- eczema rash on face and arms, no skin breakdown   Labs reviewed: Basic Metabolic Panel:  Recent Labs  06/20/14 0553 07/30/14 1320 08/05/14 0837 10/01/14 11/20/14 1612 05/19/15  NA 137 139 139 139 143 144  K 4.4 4.3 4.3 4.8 4.2 4.9  CL 104 105 105  --  108  --   CO2 24 25 27   --   --   --   GLUCOSE 104* 115* 89  --  80  --   BUN 18 32* 33* 31* 38* 38*  CREATININE 1.25 1.47* 1.54* 1.4* 1.60* 1.7*  CALCIUM 9.0 9.1 9.0  --   --   --    Liver Function Tests:  Recent Labs  06/16/14 1428 07/30/14 1320 05/19/15  AST 22 20 10*  ALT 11 15 6*  ALKPHOS 64 52 51  BILITOT 1.1 1.2  --   PROT 7.5 7.4  --   ALBUMIN 4.1 4.3  --    No results for input(s): LIPASE, AMYLASE in the last 8760 hours. No results for input(s): AMMONIA in the last 8760 hours. CBC:  Recent Labs  07/30/14 1320 08/05/14 0837 11/20/14 1558 11/20/14 1612 05/19/15  WBC 10.0 8.8 13.4*  --  13.4  NEUTROABS 8.4* 6.7 10.5*  --   --   HGB 14.2 13.3 13.5  14.3 12.4*  HCT 42.7 41.0 41.5 42.0 36*  MCV 85.9 85.6 88.3  --   --   PLT 110* 135* 175  --  168     Assessment/Plan  HTN Elevated bp today. Currently not on any anti hypertensive. Check bp bid for now for a week and adjust  medication if needed  COPD Breathing stable on room air, continue symbicort with prn duoneb  gerd Stable, continue protonix   Blanchie Serve, MD  [facility] Adult [facility] [phone] (Monday-Friday 8 am - 5 pm) [phone] (afterhours)

## 2015-06-18 LAB — LIPID PANEL
CHOLESTEROL: 100 mg/dL (ref 0–200)
HDL: 25 mg/dL — AB (ref 35–70)
LDL Cholesterol: 62 mg/dL
Triglycerides: 67 mg/dL (ref 40–160)

## 2015-06-18 LAB — HEPATIC FUNCTION PANEL
ALT: 6 U/L — AB (ref 10–40)
AST: 13 U/L — AB (ref 14–40)
Alkaline Phosphatase: 46 U/L (ref 25–125)
BILIRUBIN, TOTAL: 0.6 mg/dL

## 2015-06-18 LAB — BASIC METABOLIC PANEL
BUN: 31 mg/dL — AB (ref 4–21)
Creatinine: 1.1 mg/dL (ref 0.6–1.3)
GLUCOSE: 76 mg/dL
Potassium: 4.5 mmol/L (ref 3.4–5.3)
SODIUM: 140 mmol/L (ref 137–147)

## 2015-06-18 LAB — CBC AND DIFFERENTIAL
HEMATOCRIT: 36 % — AB (ref 41–53)
HEMOGLOBIN: 11.8 g/dL — AB (ref 13.5–17.5)
PLATELETS: 143 10*3/uL — AB (ref 150–399)
WBC: 8.1 10^3/mL

## 2015-06-18 LAB — TSH: TSH: 3.54 u[IU]/mL (ref 0.41–5.90)

## 2015-07-02 LAB — HEPATIC FUNCTION PANEL
ALT: 6 U/L — AB (ref 10–40)
AST: 13 U/L — AB (ref 14–40)
Alkaline Phosphatase: 44 U/L (ref 25–125)
Bilirubin, Total: 0.5 mg/dL

## 2015-07-02 LAB — CBC AND DIFFERENTIAL
HCT: 35 % — AB (ref 41–53)
Hemoglobin: 11.3 g/dL — AB (ref 13.5–17.5)
Platelets: 138 10*3/uL — AB (ref 150–399)
WBC: 6.5 10^3/mL

## 2015-07-02 LAB — BASIC METABOLIC PANEL
BUN: 22 mg/dL — AB (ref 4–21)
Creatinine: 1.1 mg/dL (ref 0.6–1.3)
Glucose: 85 mg/dL
POTASSIUM: 4.4 mmol/L (ref 3.4–5.3)
Sodium: 140 mmol/L (ref 137–147)

## 2015-07-06 ENCOUNTER — Encounter: Payer: Self-pay | Admitting: Internal Medicine

## 2015-07-06 ENCOUNTER — Non-Acute Institutional Stay (SKILLED_NURSING_FACILITY): Payer: Medicare Other | Admitting: Internal Medicine

## 2015-07-06 DIAGNOSIS — J181 Lobar pneumonia, unspecified organism: Principal | ICD-10-CM

## 2015-07-06 DIAGNOSIS — J449 Chronic obstructive pulmonary disease, unspecified: Secondary | ICD-10-CM

## 2015-07-06 DIAGNOSIS — F03918 Unspecified dementia, unspecified severity, with other behavioral disturbance: Secondary | ICD-10-CM

## 2015-07-06 DIAGNOSIS — R410 Disorientation, unspecified: Secondary | ICD-10-CM

## 2015-07-06 DIAGNOSIS — F0391 Unspecified dementia with behavioral disturbance: Secondary | ICD-10-CM | POA: Diagnosis not present

## 2015-07-06 DIAGNOSIS — J189 Pneumonia, unspecified organism: Secondary | ICD-10-CM

## 2015-07-06 NOTE — Progress Notes (Signed)
Patient ID: [name], male   DOB: 20-Nov-1922, 82 y.o.   MRN: [id number]     [facility]   PCP: Kandice Hams, MD  Code Status: DNR  Allergies  Allergen Reactions  . Eggs Or Egg-Derived Products Nausea And Vomiting and Rash  . Latex Rash  . Peanuts [Peanut Oil] Anaphylaxis and Swelling    "swelling of the face"  . Penicillins Rash    Tolerates ceftriaxone  . Codeine   . Erythromycin     "I don't remember"  . Fish Allergy     "I never eat fish except for halibut"  . Other Other (See Comments)    Lima Beans- Throat swelling   . Sulfa Antibiotics Other (See Comments)    Reaction unknown  . Kenalog [Triamcinolone Acetonide] Rash    Chief Complaint  Patient presents with  . Medical Management of Chronic Issues    Routine Visit     HPI:  82 year old patient is seen for routine visit. He has been agitated and confused lately per staff. He denies any concerns. No falls reported. No new skin concerns. He is currently on antibiotic treatment for pneumonia. Right basilar opacity noted on CXR.   Review of Systems:  Constitutional: Negative for fever, chills.  HENT: Negative for headache  Eyes: Negative for double vision and discharge. has glasses Respiratory: Negative for shortness of breath and wheezing. Positive for cough.    Cardiovascular: Negative for chest pain, palpitations, leg swelling.  Gastrointestinal: Negative for heartburn, nausea, vomiting, abdominal pain Genitourinary: Negative for dysuria Musculoskeletal: Negative for back pain Skin: has eczema Neurological: Negative for dizziness, tingling, focal weakness Psychiatric/Behavioral: positive for memory loss.    Past Medical History  Diagnosis Date  . COPD (chronic obstructive pulmonary disease) ([place])   . GERD (gastroesophageal reflux disease)   . Prostate cancer [facility])     "had prostate removed and had chemo" (02/09/2012)  . Depression   . DVT (deep venous  thrombosis) ([place])   . Alzheimer's disease   . High cholesterol   . Asthma   . Hypertension   . Pneumonia     "4-5 times thru my life" (02/09/2012)  . Chronic bronchitis ([place])     "get it q year" (02/09/2012)  . Exertional dyspnea     "sometimes" (02/09/2012)  . History of blood transfusion     "when prostate removed" (02/09/2012)  . Arthritis     "in my fingers"  (02/09/2012)   Medications: Patient's Medications  New Prescriptions   No medications on file  Previous Medications   ACETAMINOPHEN (APAP) 325 MG TABLET    Take 650 mg by mouth every 6 (six) hours as needed for pain.   AMBULATORY NON FORMULARY MEDICATION    Medication Name: Magic Cup 2 times daily with med pass (2 pm & 8 pm)   BUDESONIDE-FORMOTEROL (SYMBICORT) 80-4.5 MCG/ACT INHALER    Inhale 2 puffs into the lungs 2 (two) times daily. For COPD   CHOLECALCIFEROL (VITAMIN D) 1000 UNITS TABLET    Take 1,000 Units by mouth daily. For supplement   CLINDAMYCIN (CLEOCIN) 300 MG CAPSULE    Take 300 mg by mouth every 6 (six) hours. Stop date 07/10/15   CLONAZEPAM (KLONOPIN) 0.5 MG TABLET    Take 1/2 tablet by mouth BID prn   GALANTAMINE (RAZADYNE) 12 MG TABLET    Take 12 mg by mouth 2 (two) times daily. For dementia   IPRATROPIUM-ALBUTEROL (DUONEB) 0.5-2.5 (3) MG/3ML SOLN  Take 3 mLs by nebulization every 6 (six) hours as needed (for wheezing).   LEVOFLOXACIN (LEVAQUIN) 750 MG TABLET    Take 750 mg by mouth every other day. For a total of 5 tablets   MEGESTROL (MEGACE ES) 625 MG/5ML SUSPENSION    Take 625 mg by mouth daily. Administer 5 mL by mouth daily to increase appetite   MULTIVITAMIN-LUTEIN (OCUVITE-LUTEIN) CAPS    Take 1 capsule by mouth daily.   ONDANSETRON (ZOFRAN) 4 MG TABLET    Take 4 mg by mouth as needed for nausea or vomiting.    OXYBUTYNIN (DITROPAN) 5 MG TABLET    Take 5 mg by mouth daily. For overactive bladder   PANTOPRAZOLE (PROTONIX) 20 MG TABLET    Take 20 mg by mouth daily.   POLYETHYL GLYCOL-PROPYL GLYCOL  (SYSTANE OP)    Apply 1 drop to eye 4 (four) times daily. Both eyes   POLYVINYL ALCOHOL (LIQUIFILM TEARS) 1.4 % OPHTHALMIC SOLUTION    Place 1 drop into both eyes as needed for dry eyes (As directed).    PROVENTIL HFA 108 (90 BASE) MCG/ACT INHALER    INHALE 2 PUFFS BY MOUTH EVERY 6 HOURS AS NEEDED   SERTRALINE (ZOLOFT) 100 MG TABLET    Take 100 mg by mouth daily. For depression.   SERTRALINE (ZOLOFT) 25 MG TABLET    Take 1 tablet with Zoloft 100mg  daily   UNABLE TO FIND    Med Name: Med pass 120 mL by mouth 3 times daily between meals  Modified Medications   No medications on file  Discontinued Medications   No medications on file     Physical Exam: Filed Vitals:   07/06/15 1509  BP: 116/65  Pulse: 84  Temp: 97.9 F (36.6 C)  TempSrc: Oral  Resp: 18  Height: 5\' 8"  (1.727 m)  Weight: 110 lb 6.4 oz (50.077 kg)  SpO2: 98%   Wt Readings from Last 3 Encounters:  07/06/15 110 lb 6.4 oz (50.077 kg)  06/08/15 112 lb (50.803 kg)  03/23/15 104 lb 12.8 oz (47.537 kg)   General- elderly male, thin built, frail, in no acute distress Head- atraumatic, normocephalic Neck- no lymphadenopathy Cardiovascular- normal s1,s2, no murmurs, no leg edema Respiratory- bilateral clear to auscultation, no wheeze, no rhonchi, no crackles Abdomen- bowel sounds present, soft, non tender Musculoskeletal- able to move all 4 extremities, generalized weakness, on wheelchair Neurological- no focal deficit Psychiatry- alert and oriented to person    Labs reviewed: Basic Metabolic Panel:  Recent Labs  07/30/14 1320 08/05/14 0837 10/01/14 11/20/14 1612 05/19/15  NA 139 139 139 143 144  K 4.3 4.3 4.8 4.2 4.9  CL 105 105  --  108  --   CO2 25 27  --   --   --   GLUCOSE 115* 89  --  80  --   BUN 32* 33* 31* 38* 38*  CREATININE 1.47* 1.54* 1.4* 1.60* 1.7*  CALCIUM 9.1 9.0  --   --   --    Liver Function Tests:  Recent Labs  07/30/14 1320 05/19/15  AST 20 10*  ALT 15 6*  ALKPHOS 52 51    BILITOT 1.2  --   PROT 7.4  --   ALBUMIN 4.3  --    No results for input(s): LIPASE, AMYLASE in the last 8760 hours. No results for input(s): AMMONIA in the last 8760 hours. CBC:  Recent Labs  07/30/14 1320 08/05/14 0837 11/20/14 1558 11/20/14 1612 05/19/15  WBC  10.0 8.8 13.4*  --  13.4  NEUTROABS 8.4* 6.7 10.5*  --   --   HGB 14.2 13.3 13.5 14.3 12.4*  HCT 42.7 41.0 41.5 42.0 36*  MCV 85.9 85.6 88.3  --   --   PLT 110* 135* 175  --  168     Assessment/Plan  Pneumonia Continue and complete course of levaquin and clindamycin on 07/10/15. Aspiration precautions.Continue duoneb  COPD Breathing stable on room air, continue symbicort with prn duoneb  AMS New behavioral changes. He is currently on antibiotic for infection. His infection could be causing this along with dementia. Check cbc with diff and cmp. Check u/a with c/s.   Dementia Continue razadyne along with his antidepressant.   Blanchie Serve, MD  [facility] [phone] (Monday-Friday 8 am - 5 pm) [phone] (afterhours)

## 2015-07-07 LAB — BASIC METABOLIC PANEL
BUN: 32 mg/dL — AB (ref 4–21)
CREATININE: 1.6 mg/dL — AB (ref 0.6–1.3)
GLUCOSE: 101 mg/dL
POTASSIUM: 4.7 mmol/L (ref 3.4–5.3)
SODIUM: 142 mmol/L (ref 137–147)

## 2015-07-07 LAB — CBC AND DIFFERENTIAL
HEMATOCRIT: 38 % — AB (ref 41–53)
Hemoglobin: 12 g/dL — AB (ref 13.5–17.5)
PLATELETS: 164 10*3/uL (ref 150–399)
WBC: 7.4 10^3/mL

## 2015-07-07 LAB — HEPATIC FUNCTION PANEL
ALT: 9 U/L — AB (ref 10–40)
AST: 13 U/L — AB (ref 14–40)
Alkaline Phosphatase: 51 U/L (ref 25–125)
Bilirubin, Total: 0.4 mg/dL

## 2015-08-04 ENCOUNTER — Encounter: Payer: Self-pay | Admitting: Internal Medicine

## 2015-08-04 ENCOUNTER — Non-Acute Institutional Stay (SKILLED_NURSING_FACILITY): Payer: Medicare Other | Admitting: Internal Medicine

## 2015-08-04 DIAGNOSIS — Z0000 Encounter for general adult medical examination without abnormal findings: Secondary | ICD-10-CM

## 2015-08-04 DIAGNOSIS — N183 Chronic kidney disease, stage 3 unspecified: Secondary | ICD-10-CM

## 2015-08-04 DIAGNOSIS — E43 Unspecified severe protein-calorie malnutrition: Secondary | ICD-10-CM

## 2015-08-04 DIAGNOSIS — F329 Major depressive disorder, single episode, unspecified: Secondary | ICD-10-CM | POA: Diagnosis not present

## 2015-08-04 DIAGNOSIS — J449 Chronic obstructive pulmonary disease, unspecified: Secondary | ICD-10-CM | POA: Diagnosis not present

## 2015-08-04 DIAGNOSIS — K219 Gastro-esophageal reflux disease without esophagitis: Secondary | ICD-10-CM

## 2015-08-04 DIAGNOSIS — F0280 Dementia in other diseases classified elsewhere without behavioral disturbance: Secondary | ICD-10-CM

## 2015-08-04 DIAGNOSIS — R110 Nausea: Secondary | ICD-10-CM

## 2015-08-04 DIAGNOSIS — R400 Somnolence: Secondary | ICD-10-CM

## 2015-08-04 DIAGNOSIS — R32 Unspecified urinary incontinence: Secondary | ICD-10-CM | POA: Diagnosis not present

## 2015-08-04 DIAGNOSIS — F0150 Vascular dementia without behavioral disturbance: Secondary | ICD-10-CM | POA: Diagnosis not present

## 2015-08-04 DIAGNOSIS — D638 Anemia in other chronic diseases classified elsewhere: Secondary | ICD-10-CM

## 2015-08-04 DIAGNOSIS — J020 Streptococcal pharyngitis: Secondary | ICD-10-CM | POA: Diagnosis not present

## 2015-08-04 NOTE — Progress Notes (Signed)
Patient ID: [name], male   DOB: [birth year], 82 y.o.   MRN: [id number]     [facility]   PCP: Kandice Hams, MD  Code Status: DNR  Allergies  Allergen Reactions  . Eggs Or Egg-Derived Products Nausea And Vomiting and Rash  . Latex Rash  . Peanuts [Peanut Oil] Anaphylaxis and Swelling    "swelling of the face"  . Penicillins Rash    Tolerates ceftriaxone  . Codeine   . Erythromycin     "I don't remember"  . Fish Allergy     "I never eat fish except for halibut"  . Other Other (See Comments)    Lima Beans- Throat swelling   . Sulfa Antibiotics Other (See Comments)    Reaction unknown  . Kenalog [Triamcinolone Acetonide] Rash    Chief Complaint  Patient presents with  . Annual Exam    Annual Visit    Advanced Directives 06/08/2015  Does patient have an advance directive? Yes  Type of Advance Directive Out of facility DNR (pink MOST or yellow form)  Does patient want to make changes to advanced directive? No - Patient declined  Copy of advanced directive(s) in chart? Yes  Would patient like information on creating an advanced directive? -    HPI:  82 year old patient is seen for annual visit. He has had increased confusion for two days. He vomited yesterday and has poor appetite. This morning he refused to have his breakfast and complaints of sore throat. He has advanced dementia and is a high aspiration risk. He is uptodate with his immunizations. he has protein calorie malnutrition.   Review of Systems: limited with his dementia. Obtained from patient and nursing staff Constitutional: Negative for fever HENT: Negative for headache  Eyes: Negative for double vision and discharge. has glasses Respiratory: Negative for shortness of breath. Positive for cough.    Cardiovascular: Negative for chest pain.  Gastrointestinal: Negative for abdominal pain. Positive for nausea. Genitourinary: Negative for hematuria or  dysuria Musculoskeletal: Negative for back pain Skin: has eczema Neurological: Negative for dizziness Psychiatric/Behavioral: positive for memory loss.    Past Medical History  Diagnosis Date  . COPD (chronic obstructive pulmonary disease) ([place])   . GERD (gastroesophageal reflux disease)   . Prostate cancer [facility])     "had prostate removed and had chemo" (02/09/2012)  . Depression   . DVT (deep venous thrombosis) ([place])   . Alzheimer's disease   . High cholesterol   . Asthma   . Hypertension   . Pneumonia     "4-5 times thru my life" (02/09/2012)  . Chronic bronchitis ([place])     "get it q year" (02/09/2012)  . Exertional dyspnea     "sometimes" (02/09/2012)  . History of blood transfusion     "when prostate removed" (02/09/2012)  . Arthritis     "in my fingers"  (02/09/2012)   Past Surgical History  Procedure Laterality Date  . Tonsillectomy  ~ 1938  . Appendectomy  1950's?  . Cataract extraction w/ intraocular lens  implant, bilateral    . Prostatectomy    . Tear duct probing      "have had it done twice; left eye only?" (02/09/2012)  . Eye surgery      Medications: Patient's Medications  New Prescriptions   No medications on file  Previous Medications   ACETAMINOPHEN (APAP) 325 MG TABLET    Take 650 mg by mouth every 6 (six) hours as  needed for pain.   ALBUTEROL (PROVENTIL HFA;VENTOLIN HFA) 108 (90 BASE) MCG/ACT INHALER    Inhale 2 puffs into the lungs every 6 (six) hours as needed for wheezing or shortness of breath.   AMBULATORY NON FORMULARY MEDICATION    Medication Name: Magic Cup 2 times daily with med pass (2 pm & 8 pm)   BUDESONIDE-FORMOTEROL (SYMBICORT) 80-4.5 MCG/ACT INHALER    Inhale 2 puffs into the lungs 2 (two) times daily. For COPD   CHOLECALCIFEROL (VITAMIN D) 1000 UNITS TABLET    Take 1,000 Units by mouth daily. For supplement   CLONAZEPAM (KLONOPIN) 0.5 MG TABLET    Take 1/2 tablet by mouth BID prn   GALANTAMINE (RAZADYNE) 12 MG TABLET    Take 12 mg by  mouth 2 (two) times daily. For dementia   IPRATROPIUM-ALBUTEROL (DUONEB) 0.5-2.5 (3) MG/3ML SOLN    Take 3 mLs by nebulization every 6 (six) hours as needed (for wheezing).   MULTIVITAMIN-LUTEIN (OCUVITE-LUTEIN) CAPS    Take 1 capsule by mouth daily.   ONDANSETRON (ZOFRAN) 4 MG TABLET    Take 4 mg by mouth as needed for nausea or vomiting.    OXYBUTYNIN (DITROPAN) 5 MG TABLET    Take 5 mg by mouth daily. For overactive bladder   PANTOPRAZOLE (PROTONIX) 20 MG TABLET    Take 20 mg by mouth daily.   POLYETHYL GLYCOL-PROPYL GLYCOL (SYSTANE OP)    Apply 1 drop to eye 4 (four) times daily. Both eyes   POLYVINYL ALCOHOL (LIQUIFILM TEARS) 1.4 % OPHTHALMIC SOLUTION    Place 1 drop into both eyes as needed for dry eyes (As directed).    SERTRALINE (ZOLOFT) 100 MG TABLET    Take 100 mg by mouth daily. For depression.   SERTRALINE (ZOLOFT) 25 MG TABLET    Take 1 tablet with Zoloft 100mg  daily   UNABLE TO FIND    Med Name: Med pass 120 mL by mouth 4 times daily between meals  Modified Medications   No medications on file  Discontinued Medications   CLINDAMYCIN (CLEOCIN) 300 MG CAPSULE    Take 300 mg by mouth every 6 (six) hours. Stop date 07/10/15   LEVOFLOXACIN (LEVAQUIN) 750 MG TABLET    Take 750 mg by mouth every other day. For a total of 5 tablets   MEGESTROL (MEGACE ES) 625 MG/5ML SUSPENSION    Take 625 mg by mouth daily. Administer 5 mL by mouth daily to increase appetite   PROVENTIL HFA 108 (90 BASE) MCG/ACT INHALER    INHALE 2 PUFFS BY MOUTH EVERY 6 HOURS AS NEEDED     Physical Exam: Filed Vitals:   08/04/15 1126  BP: 101/67  Pulse: 70  Temp: 98.1 F (36.7 C)  TempSrc: Oral  Resp: 16  Height: 5\' 8"  (1.727 m)  Weight: 111 lb 12.8 oz (50.712 kg)  SpO2: 97%  Body mass index is 17 kg/(m^2).   Wt Readings from Last 3 Encounters:  08/04/15 111 lb 12.8 oz (50.712 kg)  07/06/15 110 lb 6.4 oz (50.077 kg)  06/08/15 112 lb (50.803 kg)   General- elderly male, thin built, frail, in no acute  distress Head- atraumatic, normocephalic Neck- no lymphadenopathy Mouth- exudates +, oropharyngeal erythema Cardiovascular- normal s1,s2, no murmurs, no leg edema Respiratory- poor air entry to right side with wheeze noted Abdomen- bowel sounds present, soft, non tender Musculoskeletal- able to move all 4 extremities, generalized weakness, on wheelchair Neurological- somnolent, confused   Labs reviewed: Basic Metabolic Panel:  Recent  Labs  08/05/14 0837  11/20/14 1612  06/18/15 07/02/15 07/07/15  NA 139  < > 143  < > 140 140 142  K 4.3  < > 4.2  < > 4.5 4.4 4.7  CL 105  --  108  --   --   --   --   CO2 27  --   --   --   --   --   --   GLUCOSE 89  --  80  --   --   --   --   BUN 33*  < > 38*  < > 31* 22* 32*  CREATININE 1.54*  < > 1.60*  < > 1.1 1.1 1.6*  CALCIUM 9.0  --   --   --   --   --   --   < > = values in this interval not displayed. Liver Function Tests:  Recent Labs  06/18/15 07/02/15 07/07/15  AST 13* 13* 13*  ALT 6* 6* 9*  ALKPHOS 46 44 51   CBC:  Recent Labs  08/05/14 0837 11/20/14 1558  06/18/15 07/02/15 07/07/15  WBC 8.8 13.4*  < > 8.1 6.5 7.4  NEUTROABS 6.7 10.5*  --   --   --   --   HGB 13.3 13.5  < > 11.8* 11.3* 12.0*  HCT 41.0 41.5  < > 36* 35* 38*  MCV 85.6 88.3  --   --   --   --   PLT 135* 175  < > 143* 138* 164  < > = values in this interval not displayed.  Lipid Panel     Component Value Date/Time   CHOL 100 06/18/2015   TRIG 67 06/18/2015   HDL 25* 06/18/2015   LDLCALC 62 06/18/2015   No results found for: HGBA1C  Immunization History  Administered Date(s) Administered  . PPD Test 06/24/2013, 08/18/2014  . Pneumococcal Polysaccharide-23 06/22/2013    Assessment/Plan  Annual exam Reviewed medications, labs and immunization. Not due for any screening exam.   Change of mental status New from his baseline. Get CXR to rule out aspiration pneumonia. Check cbc with diff, cmp. Starting empiric coverage with clindamycin 300 mg  tid for now. Aspiration precautions. Check vital signs q shift x 5  days  streptococcus pharyngitis Afebrile but has sore throat with exudate and oropharyngeal erythema. He is allergic to penicillin and  macrolides. Start clindamycin 300 mg tid x 1 week with florasotr 250 mg bid x 2 weeks. Monitor clinically  COPD Breathing stable on room air, continue symbicort with duoneb and change duoneb to q6 hr while awake  for 5 days, then q6h prn  Protein calorie malnutrition Continue medpass  And magic cup supplement. Decline anticipated with advanced dementia. Continue multivitamin  Advanced Dementia Continue razadyne along with his antidepressant.   Chronic depression Continue zoloft but decrease this to 100 mg daily for attempt of GDR. Continue prn klonopin  gerd Continue protonix but change to 20 mg bid with his nausea x 1 week and then back to 20 mg daily  Nausea Start zofran 4 mg bid x 3 days, then 4 mg po q8h prn x 1 week  UI Continue ditropan  Anemia of chronic disease Low but stable Hb. Monitor cbc  ckd stage 3 Monitor bmp  Blanchie Serve, MD  [facility] [phone] (Monday-Friday 8 am - 5 pm) [phone] (afterhours)

## 2015-08-05 LAB — CBC AND DIFFERENTIAL
HCT: 40 % — AB (ref 41–53)
HEMOGLOBIN: 13 g/dL — AB (ref 13.5–17.5)
PLATELETS: 108 10*3/uL — AB (ref 150–399)
WBC: 4.7 10^3/mL

## 2015-08-05 LAB — BASIC METABOLIC PANEL
BUN: 27 mg/dL — AB (ref 4–21)
Creatinine: 1.4 mg/dL — AB (ref 0.6–1.3)
Glucose: 83 mg/dL
POTASSIUM: 4.3 mmol/L (ref 3.4–5.3)
Sodium: 145 mmol/L (ref 137–147)

## 2015-08-05 LAB — HEPATIC FUNCTION PANEL
ALT: 9 U/L — AB (ref 10–40)
AST: 18 U/L (ref 14–40)
Alkaline Phosphatase: 52 U/L (ref 25–125)
Bilirubin, Total: 0.7 mg/dL

## 2015-08-28 ENCOUNTER — Non-Acute Institutional Stay (SKILLED_NURSING_FACILITY): Payer: Medicare Other | Admitting: Internal Medicine

## 2015-08-28 ENCOUNTER — Encounter: Payer: Self-pay | Admitting: Internal Medicine

## 2015-08-28 DIAGNOSIS — J449 Chronic obstructive pulmonary disease, unspecified: Secondary | ICD-10-CM

## 2015-08-28 DIAGNOSIS — D696 Thrombocytopenia, unspecified: Secondary | ICD-10-CM | POA: Diagnosis not present

## 2015-08-28 DIAGNOSIS — E559 Vitamin D deficiency, unspecified: Secondary | ICD-10-CM | POA: Diagnosis not present

## 2015-08-28 NOTE — Progress Notes (Signed)
Patient ID: [name], male   DOB: 08/18/1922, 82 y.o.   MRN: [id number]     [facility]   PCP: Kandice Hams, MD  Code Status: DNR  Allergies  Allergen Reactions  . Eggs Or Egg-Derived Products Nausea And Vomiting and Rash  . Latex Rash  . Peanuts [Peanut Oil] Anaphylaxis and Swelling    "swelling of the face"  . Penicillins Rash    Tolerates ceftriaxone  . Codeine   . Erythromycin     "I don't remember"  . Fish Allergy     "I never eat fish except for halibut"  . Other Other (See Comments)    Lima Beans- Throat swelling   . Sulfa Antibiotics Other (See Comments)    Reaction unknown  . Kenalog [Triamcinolone Acetonide] Rash    Chief Complaint  Patient presents with  . Medical Management of Chronic Issues    Routine Visit    Advanced Directives 06/08/2015  Does patient have an advance directive? Yes  Type of Advance Directive Out of facility DNR (pink MOST or yellow form)  Does patient want to make changes to advanced directive? No - Patient declined  Copy of advanced directive(s) in chart? Yes  Would patient like information on creating an advanced directive? -    HPI:  82 year old patient is seen for routine visit. He continues to have intermittent confusion episodes per nursing staff. He feeds himself most of the times but does need redirection and supervision at times. no fall reported.  Review of Systems: limited with his dementia. Obtained from patient and nursing staff Constitutional: Negative for fever HENT: Negative for headache  Eyes: Negative for double vision and discharge. has glasses Respiratory: Negative for shortness of breath.     Cardiovascular: Negative for chest pain.  Gastrointestinal: Negative for abdominal pain, nausea and vomiting Genitourinary: Negative for dysuria Musculoskeletal: Negative for back pain Skin: has eczema Neurological: Negative for dizziness Psychiatric/Behavioral: positive for  memory loss.    Past Medical History  Diagnosis Date  . COPD (chronic obstructive pulmonary disease) ([place])   . GERD (gastroesophageal reflux disease)   . Prostate cancer [facility])     "had prostate removed and had chemo" (02/09/2012)  . Depression   . DVT (deep venous thrombosis) ([place])   . Alzheimer's disease   . High cholesterol   . Asthma   . Hypertension   . Pneumonia     "4-5 times thru my life" (02/09/2012)  . Chronic bronchitis ([place])     "get it q year" (02/09/2012)  . Exertional dyspnea     "sometimes" (02/09/2012)  . History of blood transfusion     "when prostate removed" (02/09/2012)  . Arthritis     "in my fingers"  (02/09/2012)   Past Surgical History  Procedure Laterality Date  . Tonsillectomy  ~ 1938  . Appendectomy  1950's?  . Cataract extraction w/ intraocular lens  implant, bilateral    . Prostatectomy    . Tear duct probing      "have had it done twice; left eye only?" (02/09/2012)  . Eye surgery      Medications: Patient's Medications  New Prescriptions   No medications on file  Previous Medications   ACETAMINOPHEN (APAP) 325 MG TABLET    Take 650 mg by mouth every 6 (six) hours as needed for pain.   ALBUTEROL (PROVENTIL HFA;VENTOLIN HFA) 108 (90 BASE) MCG/ACT INHALER    Inhale 2 puffs into the lungs  every 6 (six) hours as needed for wheezing or shortness of breath.   AMBULATORY NON FORMULARY MEDICATION    Medication Name: Magic Cup 2 times daily with med pass (2 pm & 8 pm)   BUDESONIDE-FORMOTEROL (SYMBICORT) 80-4.5 MCG/ACT INHALER    Inhale 2 puffs into the lungs 2 (two) times daily. For COPD   CHOLECALCIFEROL (VITAMIN D) 1000 UNITS TABLET    Take 1,000 Units by mouth daily. For supplement   CLONAZEPAM (KLONOPIN) 0.5 MG TABLET    Take 1/2 tablet by mouth BID prn   GALANTAMINE (RAZADYNE) 12 MG TABLET    Take 12 mg by mouth 2 (two) times daily. For dementia   IPRATROPIUM-ALBUTEROL (DUONEB) 0.5-2.5 (3) MG/3ML SOLN    Take 3 mLs by nebulization every 6 (six)  hours as needed (for wheezing).   MULTIVITAMIN-LUTEIN (OCUVITE-LUTEIN) CAPS    Take 1 capsule by mouth daily.   OXYBUTYNIN (DITROPAN) 5 MG TABLET    Take 5 mg by mouth daily. For overactive bladder   PANTOPRAZOLE (PROTONIX) 20 MG TABLET    Take 20 mg by mouth daily.   POLYETHYL GLYCOL-PROPYL GLYCOL (SYSTANE OP)    Apply 1 drop to eye 4 (four) times daily. Both eyes   POLYVINYL ALCOHOL (LIQUIFILM TEARS) 1.4 % OPHTHALMIC SOLUTION    Place 1 drop into both eyes as needed for dry eyes (As directed).    SERTRALINE (ZOLOFT) 100 MG TABLET    Take 100 mg by mouth daily. For depression.   UNABLE TO FIND    Med Name: Med pass 120 mL by mouth 4 times daily between meals  Modified Medications   No medications on file  Discontinued Medications   ONDANSETRON (ZOFRAN) 4 MG TABLET    Take 4 mg by mouth as needed for nausea or vomiting.    SERTRALINE (ZOLOFT) 25 MG TABLET    Take 1 tablet with Zoloft 100mg  daily     Physical Exam: Filed Vitals:   08/28/15 1032  BP: 128/54  Pulse: 62  Temp: 96.9 F (36.1 C)  TempSrc: Oral  Resp: 18  Height: 5\' 8"  (1.727 m)  Weight: 111 lb 12.8 oz (50.712 kg)  SpO2: 98%  Body mass index is 17 kg/(m^2).   Wt Readings from Last 3 Encounters:  08/28/15 111 lb 12.8 oz (50.712 kg)  08/04/15 111 lb 12.8 oz (50.712 kg)  07/06/15 110 lb 6.4 oz (50.077 kg)   General- elderly male, thin built, frail, in no acute distress Head- atraumatic, normocephalic Neck- no lymphadenopathy Mouth- moist mucus membrane Cardiovascular- normal s1,s2, no murmurs, no leg edema Respiratory- CTAB, no wheeze or rhonchi or crackles Abdomen- bowel sounds present, soft, non tender Musculoskeletal- able to move all 4 extremities, generalized weakness, on wheelchair, kyphosis present, temporal muscle wasting Neurological- pleasantly confused   Labs reviewed: Basic Metabolic Panel:  Recent Labs  11/20/14 1612  07/02/15 07/07/15 08/05/15  NA 143  < > 140 142 145  K 4.2  < > 4.4 4.7 4.3    CL 108  --   --   --   --   GLUCOSE 80  --   --   --   --   BUN 38*  < > 22* 32* 27*  CREATININE 1.60*  < > 1.1 1.6* 1.4*  < > = values in this interval not displayed. Liver Function Tests:  Recent Labs  07/02/15 07/07/15 08/05/15  AST 13* 13* 18  ALT 6* 9* 9*  ALKPHOS 44 51 52   CBC:  Recent Labs  11/20/14 1558  07/02/15 07/07/15 08/05/15  WBC 13.4*  < > 6.5 7.4 4.7  NEUTROABS 10.5*  --   --   --   --   HGB 13.5  < > 11.3* 12.0* 13.0*  HCT 41.5  < > 35* 38* 40*  MCV 88.3  --   --   --   --   PLT 175  < > 138* 164 108*  < > = values in this interval not displayed.  Lipid Panel     Component Value Date/Time   CHOL 100 06/18/2015   TRIG 67 06/18/2015   HDL 25* 06/18/2015   LDLCALC 62 06/18/2015   No results found for: HGBA1C  Immunization History  Administered Date(s) Administered  . PPD Test 06/24/2013, 08/18/2014  . Pneumococcal Polysaccharide-23 06/22/2013    Assessment/Plan  Thrombocytopenia No bleed reported. Monitor clinically.  COPD Breathing stable on room air, continue symbicort with duoneb and monitor  Vitamin d def Continue vitamin d supplement and monitor   Blanchie Serve, MD  [facility] [phone] (Monday-Friday 8 am - 5 pm) [phone] (afterhours)

## 2015-09-23 LAB — CBC AND DIFFERENTIAL
HCT: 38 % — AB (ref 41–53)
Hemoglobin: 11.9 g/dL — AB (ref 13.5–17.5)
PLATELETS: 167 10*3/uL (ref 150–399)
WBC: 6.5 10^3/mL

## 2015-09-23 LAB — BASIC METABOLIC PANEL
BUN: 35 mg/dL — AB (ref 4–21)
Creatinine: 1.3 mg/dL (ref 0.6–1.3)
Glucose: 72 mg/dL
Potassium: 4.2 mmol/L (ref 3.4–5.3)
Sodium: 140 mmol/L (ref 137–147)

## 2015-09-23 LAB — HEPATIC FUNCTION PANEL
ALK PHOS: 54 U/L (ref 25–125)
ALT: 6 U/L — AB (ref 10–40)
AST: 13 U/L — AB (ref 14–40)
Bilirubin, Total: 0.5 mg/dL

## 2015-10-02 ENCOUNTER — Non-Acute Institutional Stay (SKILLED_NURSING_FACILITY): Payer: Medicare Other | Admitting: Internal Medicine

## 2015-10-02 ENCOUNTER — Encounter: Payer: Self-pay | Admitting: Internal Medicine

## 2015-10-02 DIAGNOSIS — F418 Other specified anxiety disorders: Secondary | ICD-10-CM

## 2015-10-02 DIAGNOSIS — Z8546 Personal history of malignant neoplasm of prostate: Secondary | ICD-10-CM

## 2015-10-02 DIAGNOSIS — E43 Unspecified severe protein-calorie malnutrition: Secondary | ICD-10-CM

## 2015-10-02 DIAGNOSIS — J690 Pneumonitis due to inhalation of food and vomit: Secondary | ICD-10-CM | POA: Diagnosis not present

## 2015-10-02 DIAGNOSIS — J449 Chronic obstructive pulmonary disease, unspecified: Secondary | ICD-10-CM | POA: Diagnosis not present

## 2015-10-02 NOTE — Progress Notes (Signed)
Patient ID: [name], male   DOB: 1923-02-16, 82 y.o.   MRN: [id number]     [facility]   PCP: Kandice Hams, MD  Code Status: DNR  Allergies  Allergen Reactions  . Eggs Or Egg-Derived Products Nausea And Vomiting and Rash  . Latex Rash  . Peanuts [Peanut Oil] Anaphylaxis and Swelling    "swelling of the face"  . Penicillins Rash    Tolerates ceftriaxone  . Codeine   . Erythromycin     "I don't remember"  . Fish Allergy     "I never eat fish except for halibut"  . Other Other (See Comments)    Lima Beans- Throat swelling   . Sulfa Antibiotics Other (See Comments)    Reaction unknown  . Kenalog [Triamcinolone Acetonide] Rash    Chief Complaint  Patient presents with  . Medical Management of Chronic Issues    Routine Visit    Advanced Directives 06/08/2015  Does patient have an advance directive? Yes  Type of Advance Directive Out of facility DNR (pink MOST or yellow form)  Does patient want to make changes to advanced directive? No - Patient declined  Copy of advanced directive(s) in chart? Yes  Would patient like information on creating an advanced directive? -    HPI:  82 year old patient is seen for routine visit. He has completed antibiotics for aspiration pneumonia. He is clinically doing better since the treatment. He needs assistance with his ADLs. He continues to lose weight.    Review of Systems: limited with his dementia. Obtained from patient and nursing staff Constitutional: Negative for fever HENT: Negative for headache  Eyes: Negative for double vision and discharge. has glasses Respiratory: Negative for shortness of breath.     Cardiovascular: Negative for chest pain.  Gastrointestinal: Negative for abdominal pain, nausea and vomiting Genitourinary: Negative for dysuria Musculoskeletal: Negative for back pain Skin: has eczema Neurological: Negative for dizziness Psychiatric/Behavioral: positive for memory  loss.    Past Medical History  Diagnosis Date  . COPD (chronic obstructive pulmonary disease) ([place])   . GERD (gastroesophageal reflux disease)   . Prostate cancer [facility])     "had prostate removed and had chemo" (02/09/2012)  . Depression   . DVT (deep venous thrombosis) ([place])   . Alzheimer's disease   . High cholesterol   . Asthma   . Hypertension   . Pneumonia     "4-5 times thru my life" (02/09/2012)  . Chronic bronchitis ([place])     "get it q year" (02/09/2012)  . Exertional dyspnea     "sometimes" (02/09/2012)  . History of blood transfusion     "when prostate removed" (02/09/2012)  . Arthritis     "in my fingers"  (02/09/2012)   Past Surgical History  Procedure Laterality Date  . Tonsillectomy  ~ 1938  . Appendectomy  1950's?  . Cataract extraction w/ intraocular lens  implant, bilateral    . Prostatectomy    . Tear duct probing      "have had it done twice; left eye only?" (02/09/2012)  . Eye surgery      Medications: Patient's Medications  New Prescriptions   No medications on file  Previous Medications   ACETAMINOPHEN (APAP) 325 MG TABLET    Take 650 mg by mouth every 6 (six) hours as needed for pain.   ALBUTEROL (PROVENTIL HFA;VENTOLIN HFA) 108 (90 BASE) MCG/ACT INHALER    Inhale 2 puffs into the lungs  every 6 (six) hours as needed for wheezing or shortness of breath.   AMBULATORY NON FORMULARY MEDICATION    Medication Name: Magic Cup 2 times daily with med pass (2 pm & 8 pm)   BUDESONIDE-FORMOTEROL (SYMBICORT) 80-4.5 MCG/ACT INHALER    Inhale 2 puffs into the lungs 2 (two) times daily. For COPD   CHOLECALCIFEROL (VITAMIN D) 1000 UNITS TABLET    Take 1,000 Units by mouth daily. For supplement   CLONAZEPAM (KLONOPIN) 0.5 MG TABLET    Take 1/2 tablet by mouth BID prn   GALANTAMINE (RAZADYNE) 12 MG TABLET    Take 12 mg by mouth 2 (two) times daily. For dementia   IPRATROPIUM-ALBUTEROL (DUONEB) 0.5-2.5 (3) MG/3ML SOLN    Take 3 mLs by nebulization every 6 (six) hours as  needed (for wheezing). Reported on 80/26/2017   MULTIVITAMIN-LUTEIN (OCUVITE-LUTEIN) CAPS    Take 1 capsule by mouth daily.   OXYBUTYNIN (DITROPAN) 5 MG TABLET    Take 5 mg by mouth daily. For overactive bladder   PANTOPRAZOLE (PROTONIX) 20 MG TABLET    Take 20 mg by mouth daily.   POLYETHYL GLYCOL-PROPYL GLYCOL (SYSTANE OP)    Apply 1 drop to eye 4 (four) times daily. Both eyes   POLYVINYL ALCOHOL (LIQUIFILM TEARS) 1.4 % OPHTHALMIC SOLUTION    Place 1 drop into both eyes as needed for dry eyes (As directed).    SACCHAROMYCES BOULARDII (FLORASTOR) 250 MG CAPSULE    Take 250 mg by mouth 2 (two) times daily. Stop date 10/08/15   SERTRALINE (ZOLOFT) 100 MG TABLET    Take 100 mg by mouth daily. For depression.   UNABLE TO FIND    Med Name: Med pass 120 mL by mouth 4 times daily between meals  Modified Medications   No medications on file  Discontinued Medications   No medications on file     Physical Exam: Filed Vitals:   10/02/15 1526  BP: 98/56  Pulse: 64  Temp: 97.8 F (36.6 C)  TempSrc: Oral  Resp: 18  Height: 5\' 8"  (1.727 m)  Weight: 104 lb (47.174 kg)  SpO2: 94%  Body mass index is 15.82 kg/(m^2).   Wt Readings from Last 3 Encounters:  10/02/15 104 lb (47.174 kg)  08/28/15 111 lb 12.8 oz (50.712 kg)  08/04/15 111 lb 12.8 oz (50.712 kg)   General- elderly male, thin built, frail, in no acute distress Head- atraumatic, normocephalic Neck- no lymphadenopathy Mouth- moist mucus membrane Cardiovascular- normal s1,s2, no murmurs, no leg edema Respiratory- CTAB, no wheeze or rhonchi or crackles Abdomen- bowel sounds present, soft, non tender Musculoskeletal- able to move all 4 extremities, generalized weakness, on wheelchair, kyphosis present, temporal muscle wasting Neurological- pleasantly confused   Labs reviewed: Basic Metabolic Panel:  Recent Labs  11/20/14 1612  07/07/15 08/05/15 09/23/15  NA 143  < > 142 145 140  K 4.2  < > 4.7 4.3 4.2  CL 108  --   --   --   --    GLUCOSE 80  --   --   --   --   BUN 38*  < > 32* 27* 35*  CREATININE 1.60*  < > 1.6* 1.4* 1.3  < > = values in this interval not displayed. Liver Function Tests:  Recent Labs  07/07/15 08/05/15 09/23/15  AST 13* 18 13*  ALT 9* 9* 6*  ALKPHOS 51 52 54   CBC:  Recent Labs  11/20/14 1558  07/07/15 08/05/15 09/23/15  WBC  13.4*  < > 7.4 4.7 6.5  NEUTROABS 10.5*  --   --   --   --   HGB 13.5  < > 12.0* 13.0* 11.9*  HCT 41.5  < > 38* 40* 38*  MCV 88.3  --   --   --   --   PLT 175  < > 164 108* 167  < > = values in this interval not displayed.  Lipid Panel     Component Value Date/Time   CHOL 100 06/18/2015   TRIG 67 06/18/2015   HDL 25* 06/18/2015   LDLCALC 62 06/18/2015   No results found for: HGBA1C  Immunization History  Administered Date(s) Administered  . PPD Test 06/24/2013, 08/18/2014  . Pneumococcal Polysaccharide-23 06/22/2013    Assessment/Plan  Aspiration pneumonia Clinically improved. Remains high risk for aspiration with his advanced dementia. Aspiration precautions. Has completed antibiotics. Monitor clinically  COPD Breathing stable on room air, continue symbicort with duoneb and monitor  Protein calorie malnutrition Continues to lose weight. Continue magic cup. Monitor weight. Assistance with feed as needed. Decline anticipated  Anxiety disorder Calm this visit. Continue klonopin 0.25 mg bid prn and monitor. Continue zoloft  History of prostate cancer Continue ditropan to help with his bladder function. Monitor clinically   Blanchie Serve, MD  [facility] [phone] (Monday-Friday 8 am - 5 pm) [phone] (afterhours)

## 2015-10-07 LAB — CBC AND DIFFERENTIAL
HCT: 36 % — AB (ref 41–53)
Hemoglobin: 11.5 g/dL — AB (ref 13.5–17.5)
Platelets: 154 10*3/uL (ref 150–399)
WBC: 5.9 10^3/mL

## 2015-10-07 LAB — HEPATIC FUNCTION PANEL
ALK PHOS: 50 U/L (ref 25–125)
ALT: 8 U/L — AB (ref 10–40)
AST: 14 U/L (ref 14–40)
Bilirubin, Total: 0.5 mg/dL

## 2015-10-07 LAB — BASIC METABOLIC PANEL
BUN: 26 mg/dL — AB (ref 4–21)
CREATININE: 1.1 mg/dL (ref 0.6–1.3)
Glucose: 75 mg/dL
Potassium: 4.7 mmol/L (ref 3.4–5.3)
SODIUM: 142 mmol/L (ref 137–147)

## 2015-12-09 ENCOUNTER — Non-Acute Institutional Stay (SKILLED_NURSING_FACILITY): Payer: Medicare Other | Admitting: Internal Medicine

## 2015-12-09 ENCOUNTER — Encounter: Payer: Self-pay | Admitting: Internal Medicine

## 2015-12-09 DIAGNOSIS — E43 Unspecified severe protein-calorie malnutrition: Secondary | ICD-10-CM | POA: Diagnosis not present

## 2015-12-09 DIAGNOSIS — G309 Alzheimer's disease, unspecified: Secondary | ICD-10-CM

## 2015-12-09 DIAGNOSIS — F0280 Dementia in other diseases classified elsewhere without behavioral disturbance: Secondary | ICD-10-CM

## 2015-12-09 DIAGNOSIS — F0150 Vascular dementia without behavioral disturbance: Secondary | ICD-10-CM | POA: Diagnosis not present

## 2015-12-09 DIAGNOSIS — F329 Major depressive disorder, single episode, unspecified: Secondary | ICD-10-CM

## 2015-12-09 NOTE — Progress Notes (Signed)
Patient ID: [name], male   DOB: Sep 21, 1922, 82 y.o.   MRN: [id number]     [facility]   PCP: Kandice Hams, MD  Code Status: DNR  Allergies  Allergen Reactions  . Eggs Or Egg-Derived Products Nausea And Vomiting and Rash  . Latex Rash  . Peanuts [Peanut Oil] Anaphylaxis and Swelling    "swelling of the face"  . Penicillins Rash    Tolerates ceftriaxone  . Codeine   . Erythromycin     "I don't remember"  . Fish Allergy     "I never eat fish except for halibut"  . Other Other (See Comments)    [place] Beans- Throat swelling   . Sulfa Antibiotics Other (See Comments)    Reaction unknown  . Kenalog [Triamcinolone Acetonide] Rash    Chief Complaint  Patient presents with  . Medical Management of Chronic Issues    Routine Visit    Advanced Directives 06/08/2015  Does patient have an advance directive? Yes  Type of Advance Directive Out of facility DNR (pink MOST or yellow form)  Does patient want to make changes to advanced directive? No - Patient declined  Copy of advanced directive(s) in chart? Yes  Would patient like information on creating an advanced directive? -  Pre-existing out of facility DNR order (yellow form or pink MOST form) -    HPI:  82 year old patient is seen for routine visit. He has been at his baseline, pleasantly confused and under total care. No falls reported. No new skin concern. He is seen in his room today.   Review of Systems: limited with his dementia. Obtained from patient and nursing staff Constitutional: Negative for fever HENT: Negative for headache  Eyes: Negative for double vision and discharge. he has glasses Respiratory: Negative for shortness of breath.     Cardiovascular: Negative for chest pain.  Gastrointestinal: Negative for abdominal pain, vomiting Genitourinary: Negative for dysuria Musculoskeletal: Negative for back pain Psychiatric/Behavioral: positive for memory loss.    Past  Medical History:  Diagnosis Date  . Alzheimer's disease   . Arthritis    "in my fingers"  (02/09/2012)  . Asthma   . Chronic bronchitis ([place])    "get it q year" (02/09/2012)  . COPD (chronic obstructive pulmonary disease) ([place])   . Depression   . DVT (deep venous thrombosis) ([place])   . Exertional dyspnea    "sometimes" (02/09/2012)  . GERD (gastroesophageal reflux disease)   . High cholesterol   . History of blood transfusion    "when prostate removed" (02/09/2012)  . Hypertension   . Pneumonia    "4-5 times thru my life" (02/09/2012)  . Prostate cancer [facility])    "had prostate removed and had chemo" (02/09/2012)   Past Surgical History:  Procedure Laterality Date  . APPENDECTOMY  1950's?  [facility] CATARACT EXTRACTION W/ INTRAOCULAR LENS  IMPLANT, BILATERAL    . EYE SURGERY    . PROSTATECTOMY    . TEAR DUCT PROBING     "have had it done twice; left eye only?" (02/09/2012)  . TONSILLECTOMY  ~ 1938    Medications: Patient's Medications  New Prescriptions   No medications on file  Previous Medications   ACETAMINOPHEN (APAP) 325 MG TABLET    Take 650 mg by mouth every 6 (six) hours as needed for pain.   ALBUTEROL (PROVENTIL HFA;VENTOLIN HFA) 108 (90 BASE) MCG/ACT INHALER    Inhale 2 puffs into the lungs every 6 (  six) hours as needed for wheezing or shortness of breath.   AMBULATORY NON FORMULARY MEDICATION    Medication Name: Magic Cup 3 times daily with med pass (2 pm & 8 pm)   BUDESONIDE-FORMOTEROL (SYMBICORT) 80-4.5 MCG/ACT INHALER    Inhale 2 puffs into the lungs 2 (two) times daily. For COPD   CHOLECALCIFEROL (VITAMIN D) 1000 UNITS TABLET    Take 1,000 Units by mouth daily. For supplement   CLONAZEPAM (KLONOPIN) 0.5 MG TABLET    Take 1/2 tablet by mouth BID prn   GALANTAMINE (RAZADYNE) 12 MG TABLET    Take 12 mg by mouth 2 (two) times daily. For dementia   IPRATROPIUM-ALBUTEROL (DUONEB) 0.5-2.5 (3) MG/3ML SOLN    Take 3 mLs by nebulization every 6 (six) hours as needed (for wheezing).  Reported on 10/02/2015   MEGESTROL (MEGACE ES) 625 MG/5ML SUSPENSION    Take by mouth daily. Give 5 mL by mouth daily   MULTIVITAMIN-LUTEIN (OCUVITE-LUTEIN) CAPS    Take 1 capsule by mouth daily.   OXYBUTYNIN (DITROPAN) 5 MG TABLET    Take 5 mg by mouth daily. For overactive bladder   PANTOPRAZOLE (PROTONIX) 20 MG TABLET    Take 20 mg by mouth daily.   POLYETHYL GLYCOL-PROPYL GLYCOL (SYSTANE OP)    Apply 1 drop to eye 4 (four) times daily. Both eyes   POLYVINYL ALCOHOL (LIQUIFILM TEARS) 1.4 % OPHTHALMIC SOLUTION    Place 1 drop into both eyes as needed for dry eyes (As directed).    SERTRALINE (ZOLOFT) 100 MG TABLET    Take 100 mg by mouth daily. For depression.   UNABLE TO FIND    Med Name: Med pass 120 mL by mouth 4 times daily between meals  Modified Medications   No medications on file  Discontinued Medications   SACCHAROMYCES BOULARDII (FLORASTOR) 250 MG CAPSULE    Take 250 mg by mouth 2 (two) times daily. Stop date 10/08/15     Physical Exam: Vitals:   12/09/15 1427  BP: 112/85  Pulse: 74  Resp: 18  Temp: (!) 96.8 F (36 C)  TempSrc: Oral  SpO2: 99%  Weight: 96 lb 9.6 oz (43.8 kg)  Height: 5\' 8"  (1.727 m)  Body mass index is 14.69 kg/m.   Wt Readings from Last 3 Encounters:  12/09/15 96 lb 9.6 oz (43.8 kg)  10/02/15 104 lb (47.2 kg)  08/28/15 111 lb 12.8 oz (50.7 kg)   General- elderly male, thin built, frail, in no acute distress Head- atraumatic, normocephalic Neck- no lymphadenopathy Mouth- moist mucus membrane Cardiovascular- normal s1,s2, no murmurs, no leg edema Respiratory- CTAB, no wheeze or rhonchi or crackles Abdomen- bowel sounds present, soft, non tender Musculoskeletal- able to move all 4 extremities, generalized weakness, on wheelchair, kyphosis present, temporal muscle wasting Neurological- pleasantly confused   Labs reviewed: Basic Metabolic Panel:  Recent Labs  08/05/15 09/23/15 10/07/15  NA 145 140 142  K 4.3 4.2 4.7  BUN 27* 35* 26*    CREATININE 1.4* 1.3 1.1   Liver Function Tests:  Recent Labs  08/05/15 09/23/15 10/07/15  AST 18 13* 14  ALT 9* 6* 8*  ALKPHOS 52 54 50   CBC:  Recent Labs  08/05/15 09/23/15 10/07/15  WBC 4.7 6.5 5.9  HGB 13.0* 11.9* 11.5*  HCT 40* 38* 36*  PLT 108* 167 154    Lipid Panel     Component Value Date/Time   CHOL 100 06/18/2015   TRIG 67 06/18/2015   HDL 25 (  A) 06/18/2015   LDLCALC 62 06/18/2015   No results found for: HGBA1C  Immunization History  Administered Date(s) Administered  . PPD Test 06/24/2013, 08/18/2014  . Pneumococcal Polysaccharide-23 06/22/2013    Assessment/Plan  Severe protein calorie malnutrition Continues to lose weight. Currently on megace to help stimulate appetite and nutritional supplement- magic cup and medpass. Monitor clinically. With his advanced dementia, decline anticipated.  Mixed dementia Decline anticipated. Continue supportive care with pressure ulcer prophylaxis and fall precautions. Continue razadyne.  Chronic Depression Continue zoloft for now with his prn clonazepam and monitor clinically, followed by psych services     Blanchie Serve, MD  [facility] [phone] (Monday-Friday 8 am - 5 pm) [phone] (afterhours)

## 2016-02-07 DEATH — deceased

## 2016-10-21 IMAGING — CT CT CHEST W/ CM
2 of 3 series · 15 of 36 positions shown, 18 images · IV contrast (OMNIPAQUE)
Comparison: Chest radiograph - earlier same day; 06/20/2013 chest
CT- 01/03/2011

CLINICAL DATA: Abnormal chest radiograph.

EXAM:
CT CHEST WITH CONTRAST
TECHNIQUE: Multidetector CT imaging of the chest was performed during
intravenous contrast administration.
CONTRAST:  80mL OMNIPAQUE IOHEXOL 300 MG/ML  SOLN

[Series 2: chest with st · axial · 0.76mm/px · z∈[-410,-124]mm · 12 of 67 slices shown, 15 images]
[im 5/67  mediastinal]
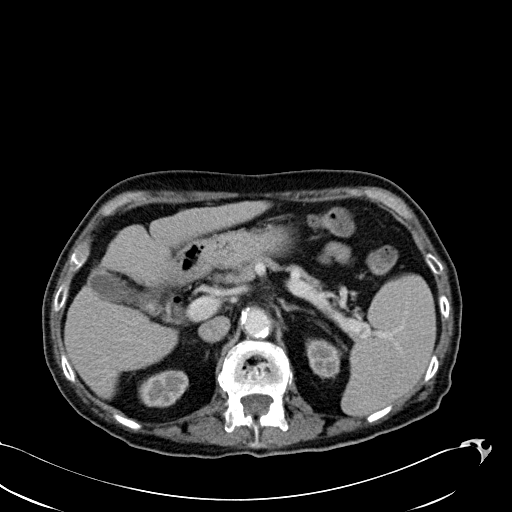
[im 5/67  lung]
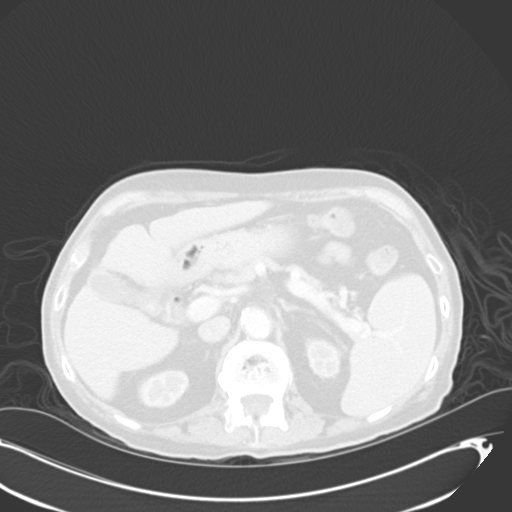
[im 10/67  lung]
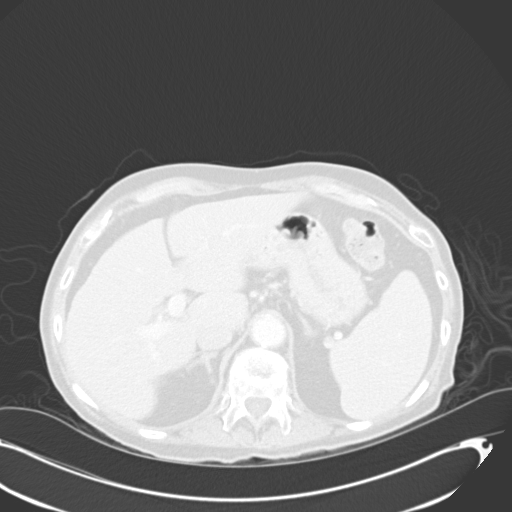
[im 15/67  lung]
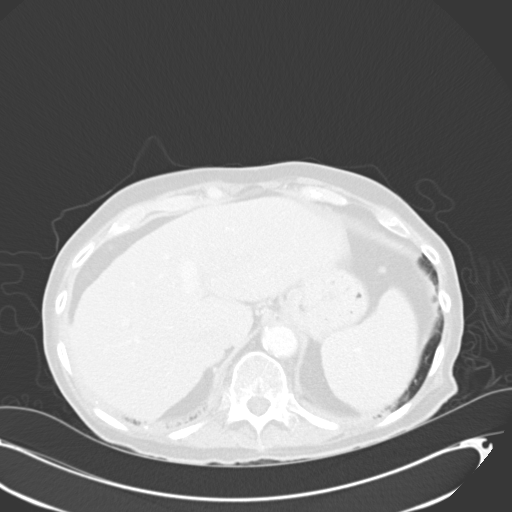
[im 20/67  lung]
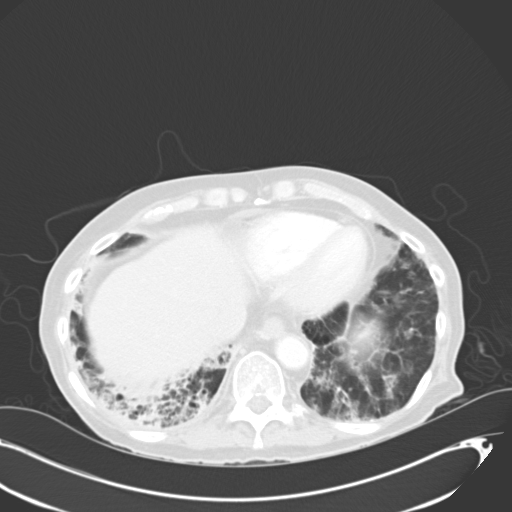
[im 25/67  mediastinal]
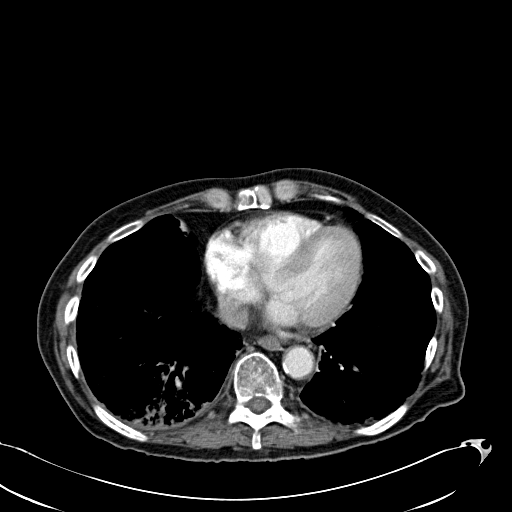
[im 25/67  lung]
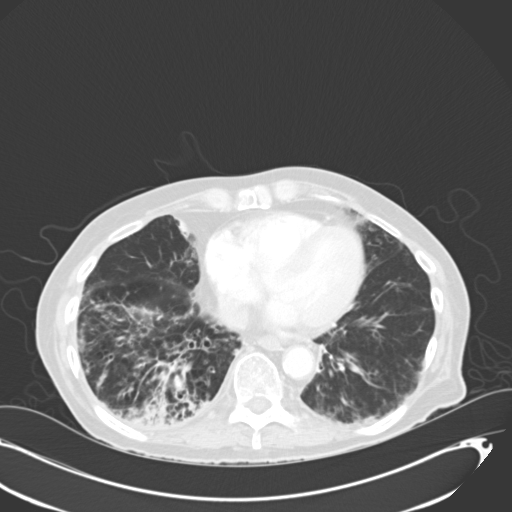
[im 30/67  lung]
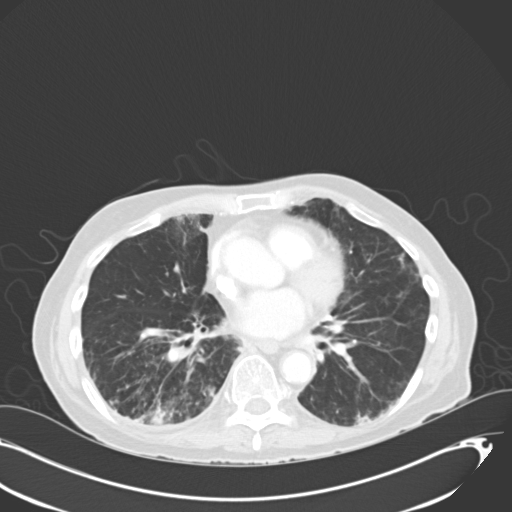
[im 37/67  lung]
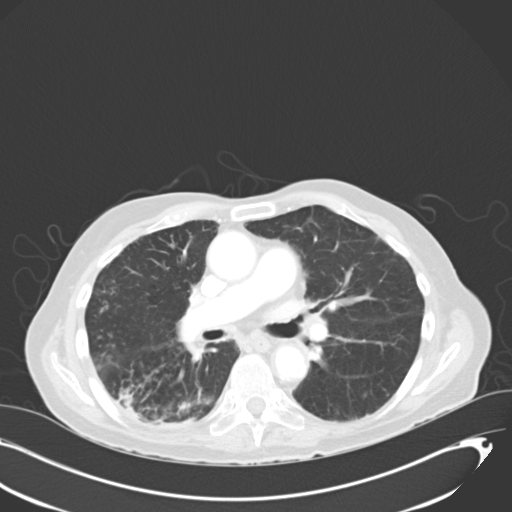
[im 42/67  lung]
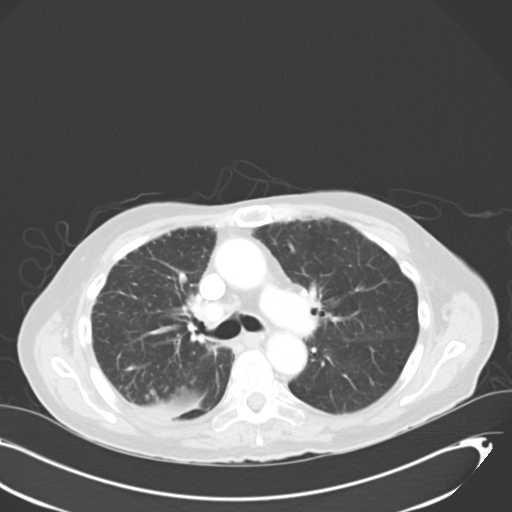
[im 47/67  mediastinal]
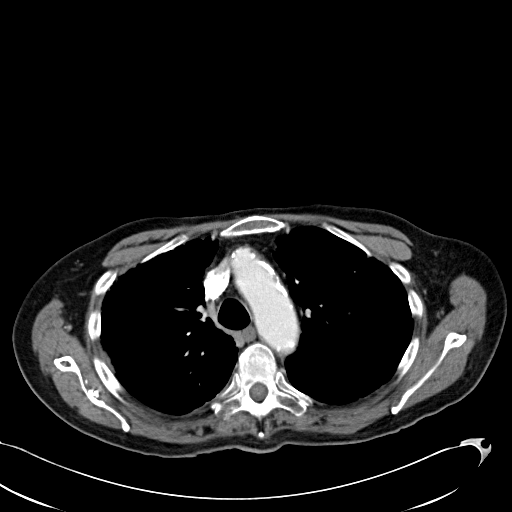
[im 47/67  lung]
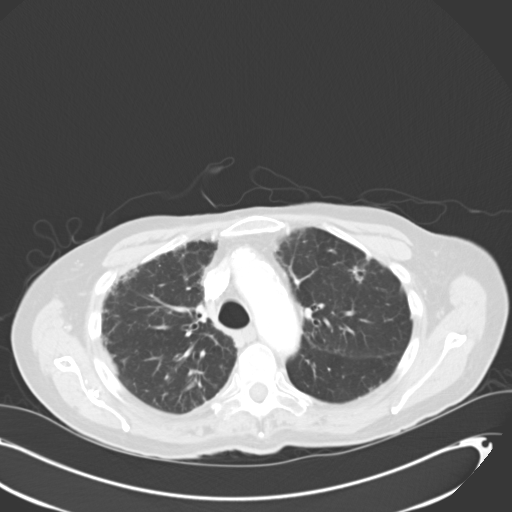
[im 52/67  lung]
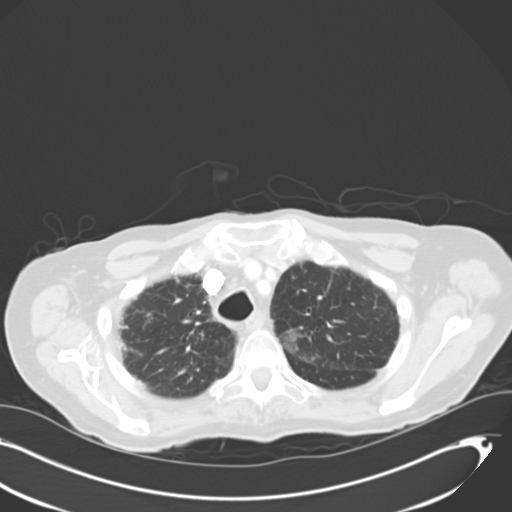
[im 57/67  lung]
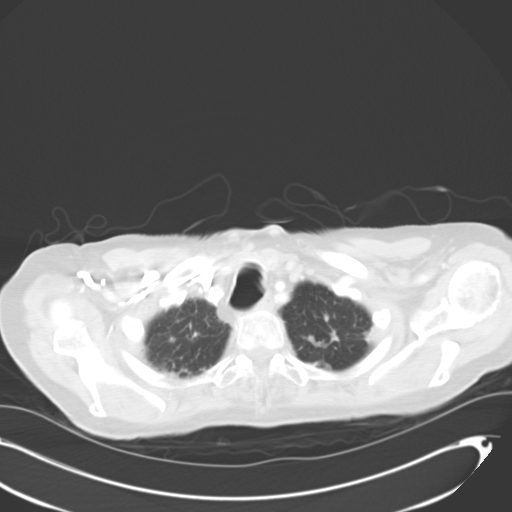
[im 62/67  lung]
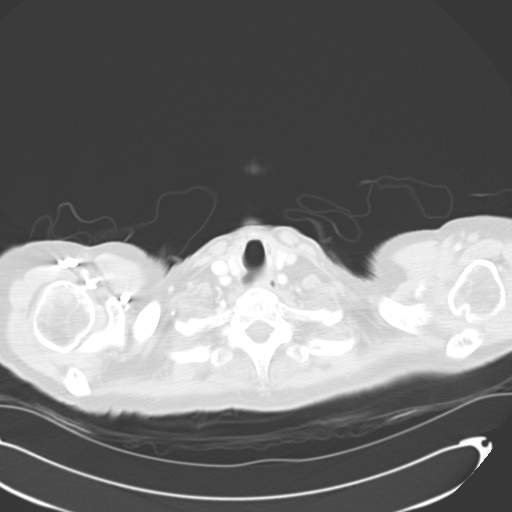

[Series 602: coronal · coronal · 0.76mm/px · 3 of 106 slices shown]
[im 22/106  lung]
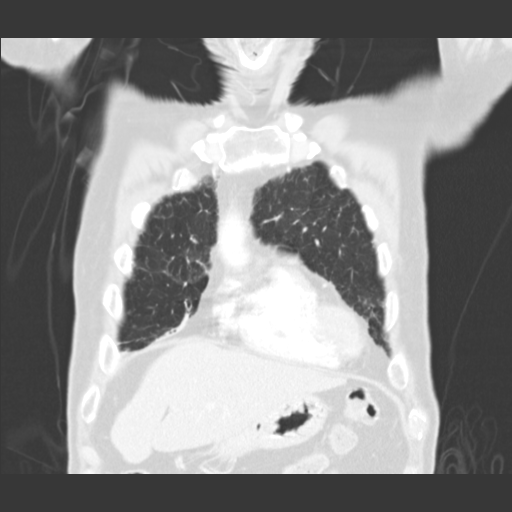
[im 43/106  lung]
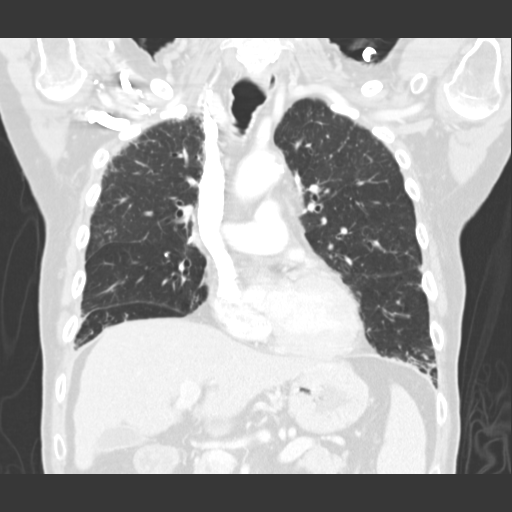
[im 64/106  lung]
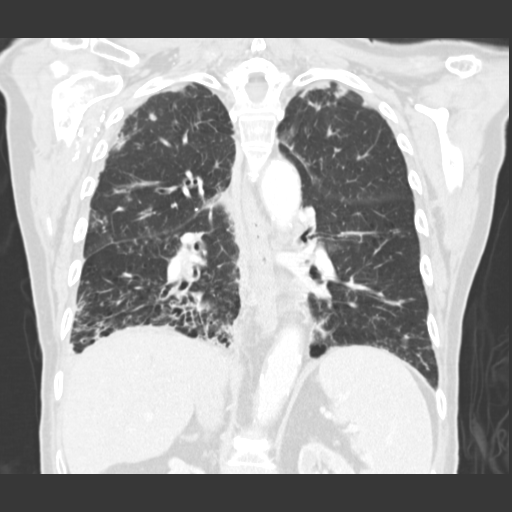

[15 of 36 positions shown; findings below may reference images not displayed]

FINDINGS: There is cystic bronchiectasis involving the right lower lobe
(representative images 42 and 45, series 5). progressed since the
6076 examination with worsening interstitial and consolidative
opacities. The central pulmonary airways remain remain patent.

There are consolidative opacities within the dependent portion of
the left lower lobe (image 52, series 5) intra favored to represent
a combination of atelectasis and scar. Grossly unchanged slightly
asymmetric biapical pleural parenchymal thickening, left greater
than right.

Pleural-parenchymal thickening about the superior aspects of the
right major fissure. No pleural effusion or pneumothorax.

The sub solid opacity within the left lung apex is grossly unchanged
since the 6076 examination, measuring 1.4 x 0.9 cm (image 22, series
5) as is the approximately 0.9 cm nodule within the right lung apex
(image 14, series 5) both of which are favored to be the sequela of
prior infection.

Shotty suprahilar nodal conglomeration is not enlarged by size
criteria measuring approximately 0.9 cm in greatest short axis
diameter. No bulky mediastinal, hilar or axillary lymphadenopathy.

Normal heart size. No pericardial effusion. Although this
examination was not tailored for the evaluation the pulmonary
arteries, there are no discrete filling defects within the central
pulmonary arterial tree to suggest central pulmonary embolism.
Normal caliber of the main pulmonary artery. Moderate amount of
mixed calcified and noncalcified atherosclerotic plaque within a
normal caliber thoracic aorta. No definite thoracic aortic
dissection or periaortic stranding.

Note is made of an approximately 1.4 cm hypo attenuating nodule
within in the left lobe of the thyroid, unchanged since the 6076
examination.

Limited evaluation of the upper abdomen demonstrates an
approximately 0.6 cm stone within the neck of an otherwise normal
appearing gallbladder.

Grossly unchanged moderate (approximately 40%) compression deformity
involving the superior endplate of the T12 vertebral body, similar
to prior chest radiograph performed [DATE].
IMPRESSION: 1. Progressive cystic bronchiectasis involving the right lower lobe
with associated worsening interstitial consolidative airspace
opacities - favored to represent the progression of chronic atypical
infection (such as FRANZ PETER), though ongoing aspiration could have a
similar appearance. Clinical correlation is advised.
2. Nodules within the bilateral upper lobes are unchanged since the
6076 examination and thus of benign etiology, likely the sequela of
prior infection and/or inflammation.
3. Incidentally noted cholelithiasis without evidence of
cholecystitis.
4. Unchanged moderate (approximately 40%) compression deformity
involving the superior endplate of the T12 vertebral body, similar
to prior chest radiograph performed [DATE].
